# Patient Record
Sex: Female | Born: 1935 | Race: White | Hispanic: Yes | Marital: Married
Health system: Southern US, Community
[De-identification: ages and names within clinical notes are randomized; demographics above are authoritative.]

## PROBLEM LIST (undated history)

## (undated) DIAGNOSIS — E039 Hypothyroidism, unspecified: Secondary | ICD-10-CM

## (undated) DIAGNOSIS — I10 Essential (primary) hypertension: Secondary | ICD-10-CM

## (undated) DIAGNOSIS — F0390 Unspecified dementia without behavioral disturbance: Secondary | ICD-10-CM

## (undated) DIAGNOSIS — I82409 Acute embolism and thrombosis of unspecified deep veins of unspecified lower extremity: Secondary | ICD-10-CM

## (undated) DIAGNOSIS — E079 Disorder of thyroid, unspecified: Secondary | ICD-10-CM

## (undated) HISTORY — PX: JOINT REPLACEMENT: SHX530

## (undated) HISTORY — PX: ABDOMINAL HYSTERECTOMY: SHX81

## (undated) HISTORY — DX: Essential (primary) hypertension: I10

## (undated) HISTORY — DX: Disorder of thyroid, unspecified: E07.9

## (undated) HISTORY — PX: APPENDECTOMY: SHX54

## (undated) HISTORY — PX: CHOLECYSTECTOMY: SHX55

## (undated) HISTORY — PX: NO PAST SURGERIES: SHX2092

## (undated) HISTORY — PX: OTHER SURGICAL HISTORY: SHX169

## (undated) HISTORY — PX: TUBAL LIGATION: SHX77

---

## 1999-05-17 ENCOUNTER — Encounter: Payer: Self-pay | Admitting: Family Medicine

## 1999-05-17 ENCOUNTER — Encounter: Admission: RE | Admit: 1999-05-17 | Discharge: 1999-05-17 | Payer: Self-pay | Admitting: Family Medicine

## 2000-05-20 ENCOUNTER — Encounter: Payer: Self-pay | Admitting: Family Medicine

## 2000-05-20 ENCOUNTER — Encounter: Admission: RE | Admit: 2000-05-20 | Discharge: 2000-05-20 | Payer: Self-pay | Admitting: Family Medicine

## 2000-08-01 ENCOUNTER — Emergency Department (HOSPITAL_COMMUNITY): Admission: EM | Admit: 2000-08-01 | Discharge: 2000-08-01 | Payer: Self-pay | Admitting: Emergency Medicine

## 2000-08-01 ENCOUNTER — Encounter: Payer: Self-pay | Admitting: Emergency Medicine

## 2001-06-02 ENCOUNTER — Encounter: Payer: Self-pay | Admitting: Family Medicine

## 2001-06-02 ENCOUNTER — Encounter: Admission: RE | Admit: 2001-06-02 | Discharge: 2001-06-02 | Payer: Self-pay | Admitting: Family Medicine

## 2001-10-22 ENCOUNTER — Encounter: Admission: RE | Admit: 2001-10-22 | Discharge: 2001-12-23 | Payer: Self-pay | Admitting: Orthopaedic Surgery

## 2002-06-08 ENCOUNTER — Encounter: Admission: RE | Admit: 2002-06-08 | Discharge: 2002-06-08 | Payer: Self-pay | Admitting: Family Medicine

## 2002-06-08 ENCOUNTER — Encounter: Payer: Self-pay | Admitting: Family Medicine

## 2002-07-19 ENCOUNTER — Encounter: Admission: RE | Admit: 2002-07-19 | Discharge: 2002-09-23 | Payer: Self-pay | Admitting: Orthopedic Surgery

## 2003-07-13 ENCOUNTER — Ambulatory Visit (HOSPITAL_COMMUNITY): Admission: RE | Admit: 2003-07-13 | Discharge: 2003-07-13 | Payer: Self-pay | Admitting: Family Medicine

## 2003-11-29 ENCOUNTER — Encounter: Admission: RE | Admit: 2003-11-29 | Discharge: 2004-02-01 | Payer: Self-pay | Admitting: Family Medicine

## 2004-04-10 ENCOUNTER — Encounter: Admission: RE | Admit: 2004-04-10 | Discharge: 2004-05-11 | Payer: Self-pay

## 2004-10-22 ENCOUNTER — Ambulatory Visit (HOSPITAL_COMMUNITY): Admission: RE | Admit: 2004-10-22 | Discharge: 2004-10-22 | Payer: Self-pay | Admitting: Family Medicine

## 2007-03-31 ENCOUNTER — Encounter: Payer: Self-pay | Admitting: Endocrinology

## 2007-04-29 ENCOUNTER — Encounter: Payer: Self-pay | Admitting: Endocrinology

## 2007-05-13 ENCOUNTER — Encounter: Payer: Self-pay | Admitting: Endocrinology

## 2007-05-13 ENCOUNTER — Encounter: Admission: RE | Admit: 2007-05-13 | Discharge: 2007-05-13 | Payer: Self-pay | Admitting: Family Medicine

## 2007-06-29 ENCOUNTER — Encounter: Admission: RE | Admit: 2007-06-29 | Discharge: 2007-06-29 | Payer: Self-pay | Admitting: Family Medicine

## 2007-06-30 ENCOUNTER — Encounter: Payer: Self-pay | Admitting: Endocrinology

## 2007-07-08 ENCOUNTER — Encounter: Payer: Self-pay | Admitting: Endocrinology

## 2007-07-20 DIAGNOSIS — Z8672 Personal history of thrombophlebitis: Secondary | ICD-10-CM | POA: Insufficient documentation

## 2007-07-20 DIAGNOSIS — M1990 Unspecified osteoarthritis, unspecified site: Secondary | ICD-10-CM | POA: Insufficient documentation

## 2007-07-21 ENCOUNTER — Ambulatory Visit: Payer: Self-pay | Admitting: Endocrinology

## 2007-08-11 ENCOUNTER — Encounter: Admission: RE | Admit: 2007-08-11 | Discharge: 2007-08-11 | Payer: Self-pay | Admitting: Endocrinology

## 2007-08-24 ENCOUNTER — Telehealth (INDEPENDENT_AMBULATORY_CARE_PROVIDER_SITE_OTHER): Payer: Self-pay | Admitting: *Deleted

## 2007-09-21 ENCOUNTER — Ambulatory Visit: Payer: Self-pay | Admitting: Endocrinology

## 2007-09-21 LAB — CONVERTED CEMR LAB: TSH: 0.02 microintl units/mL — ABNORMAL LOW (ref 0.35–5.50)

## 2007-10-21 ENCOUNTER — Ambulatory Visit: Payer: Self-pay | Admitting: Endocrinology

## 2007-10-21 DIAGNOSIS — E890 Postprocedural hypothyroidism: Secondary | ICD-10-CM | POA: Insufficient documentation

## 2007-10-21 LAB — CONVERTED CEMR LAB: Free T4: 0.3 ng/dL — ABNORMAL LOW (ref 0.6–1.6)

## 2007-11-03 ENCOUNTER — Telehealth (INDEPENDENT_AMBULATORY_CARE_PROVIDER_SITE_OTHER): Payer: Self-pay | Admitting: *Deleted

## 2007-11-18 ENCOUNTER — Ambulatory Visit: Payer: Self-pay | Admitting: Endocrinology

## 2007-12-31 ENCOUNTER — Ambulatory Visit: Payer: Self-pay | Admitting: Endocrinology

## 2008-01-04 LAB — CONVERTED CEMR LAB: TSH: 3.98 microintl units/mL (ref 0.35–5.50)

## 2008-03-31 ENCOUNTER — Encounter: Admission: RE | Admit: 2008-03-31 | Discharge: 2008-03-31 | Payer: Self-pay | Admitting: Family Medicine

## 2008-04-07 ENCOUNTER — Ambulatory Visit: Payer: Self-pay | Admitting: Endocrinology

## 2008-04-07 LAB — CONVERTED CEMR LAB: TSH: 2.59 u[IU]/mL

## 2008-04-22 IMAGING — US US SOFT TISSUE HEAD/NECK
1 series · 14 of 25 positions shown · non-contrast
Comparison: none

CLINICAL DATA: Low TSH.
 THYROID ULTRASOUND:
TECHNIQUE: Ultrasound examination of the thyroid gland and adjacent soft tissue structures was performed.

[Series 1: us soft tissue head/neck · 0.08mm/px · 14 of 41 slices shown]
[im 1/41]
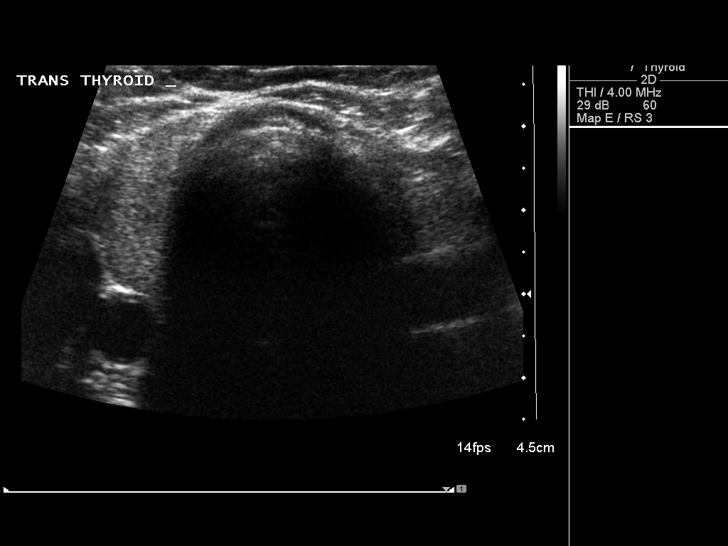
[im 4/41]
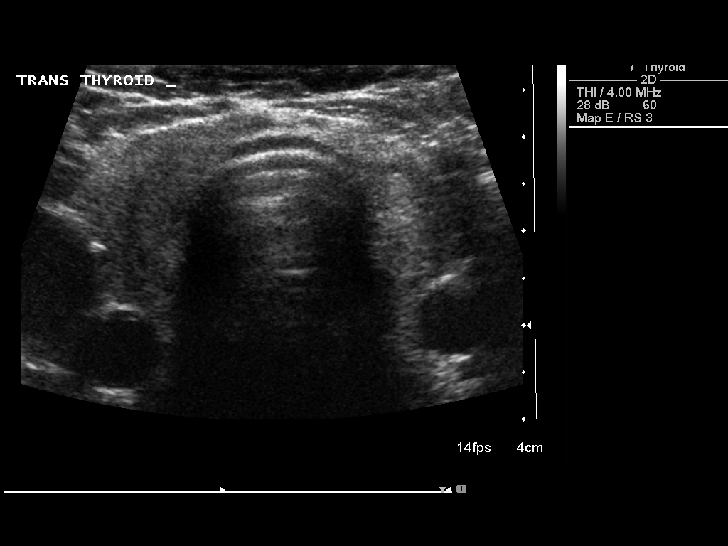
[im 7/41]
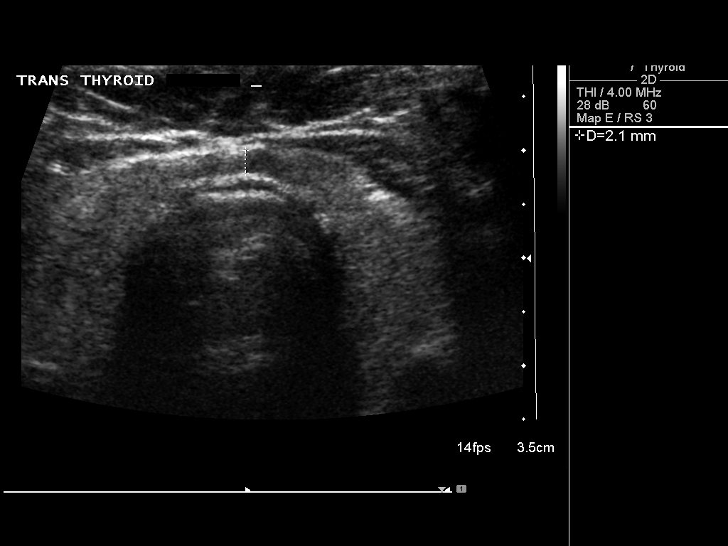
[im 11/41]
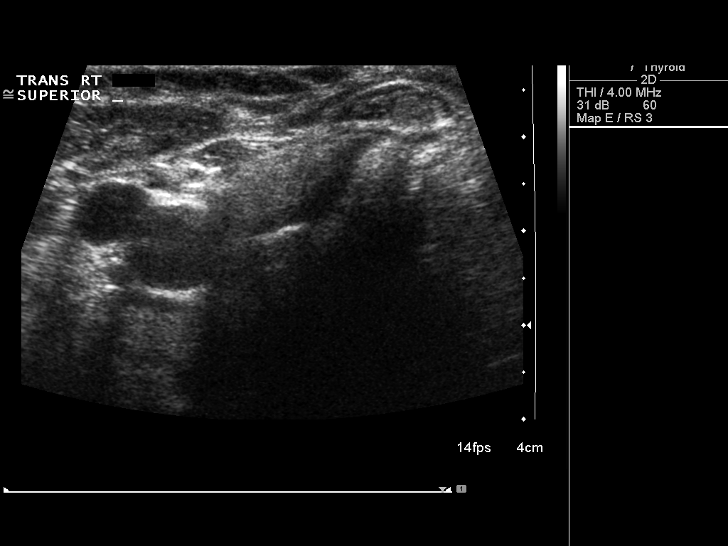
[im 14/41]
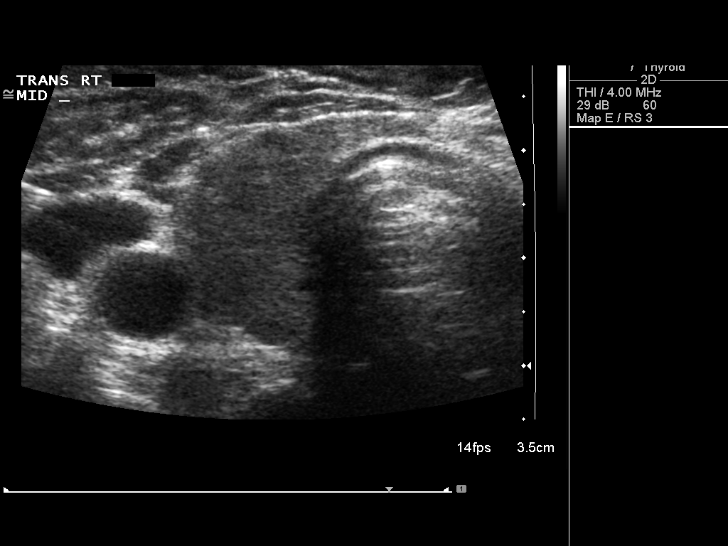
[im 16/41]
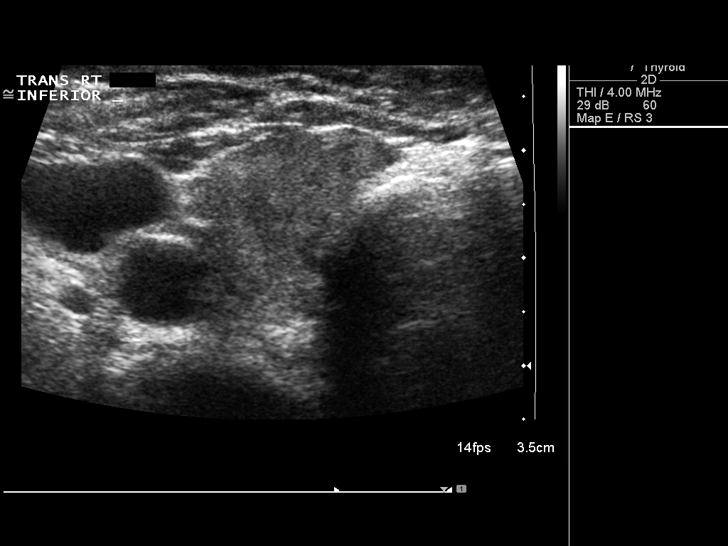
[im 19/41]
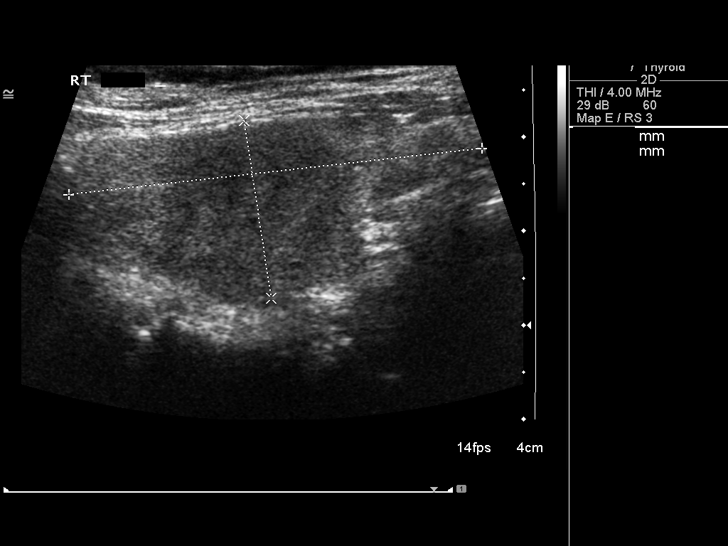
[im 22/41]
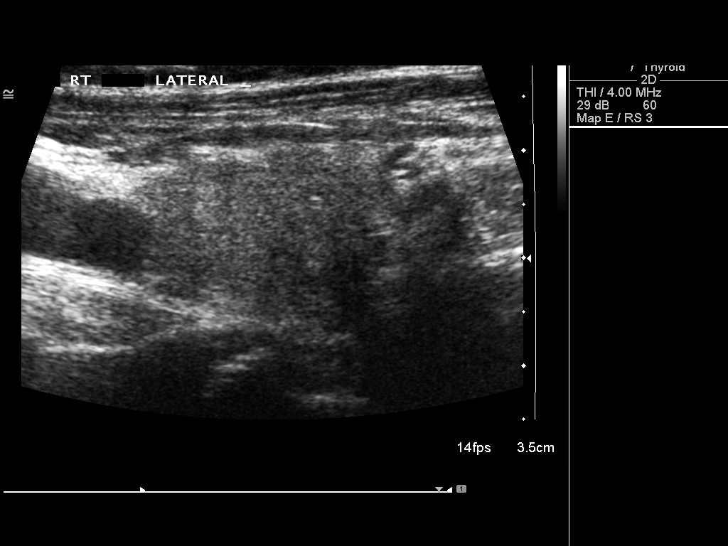
[im 26/41]
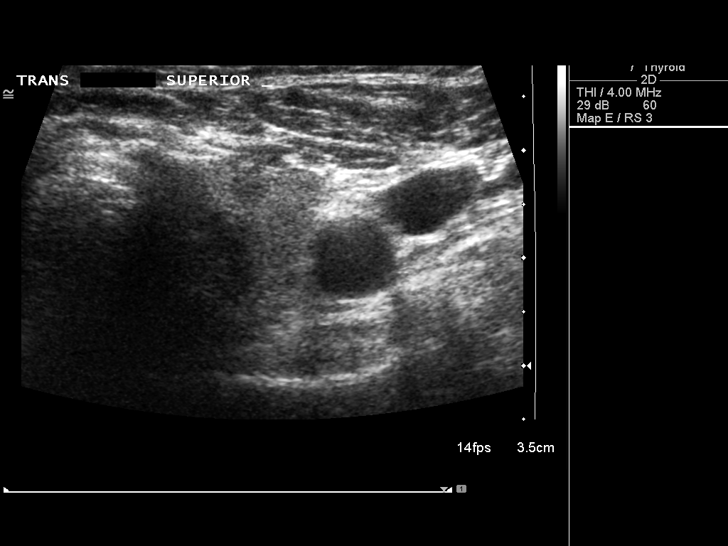
[im 27/41]
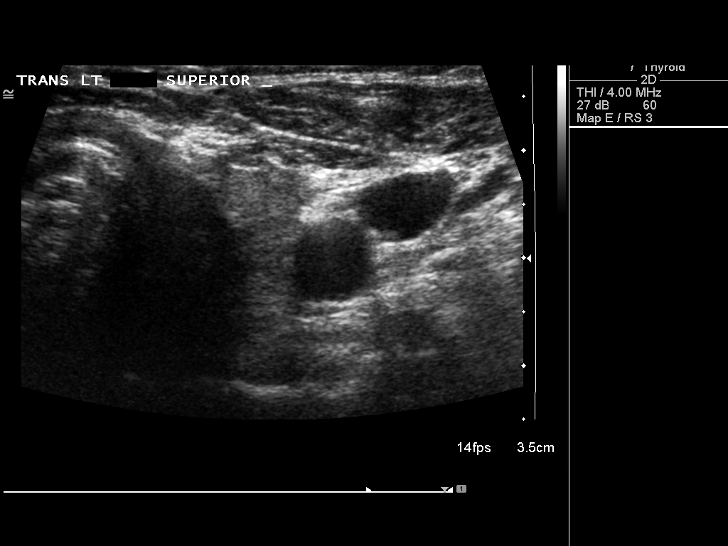
[im 31/41]
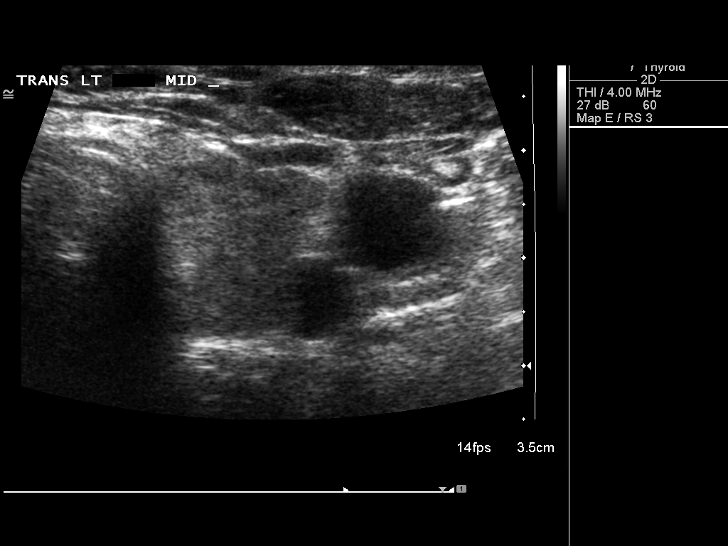
[im 34/41]
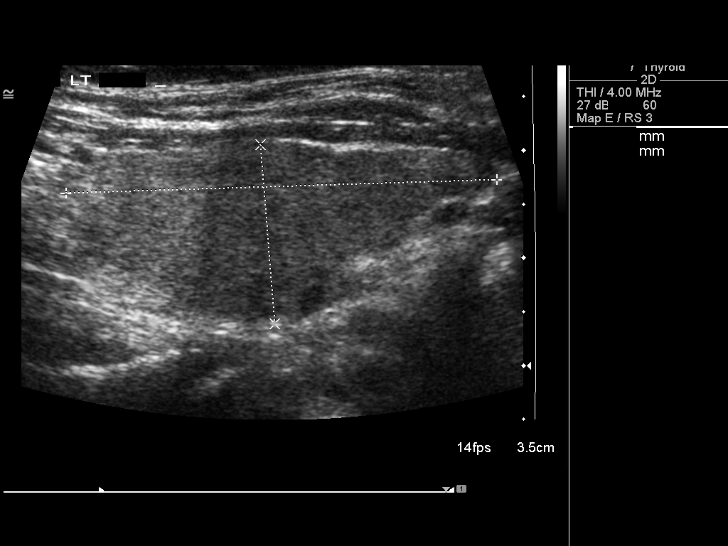
[im 37/41]
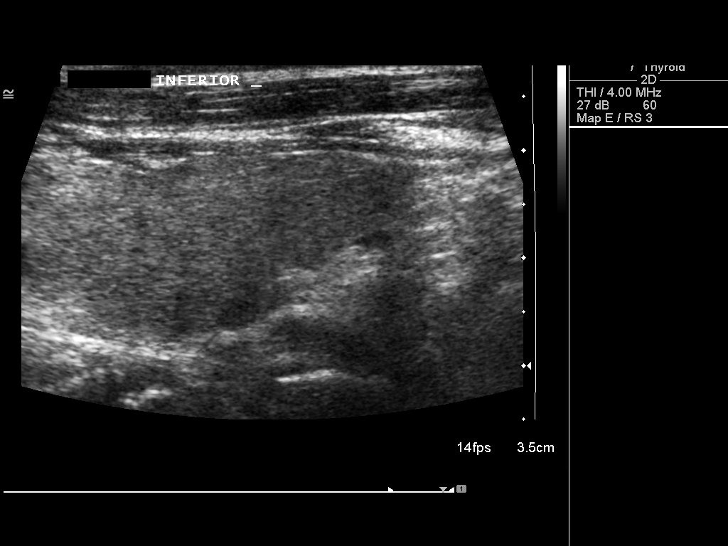
[im 41/41]
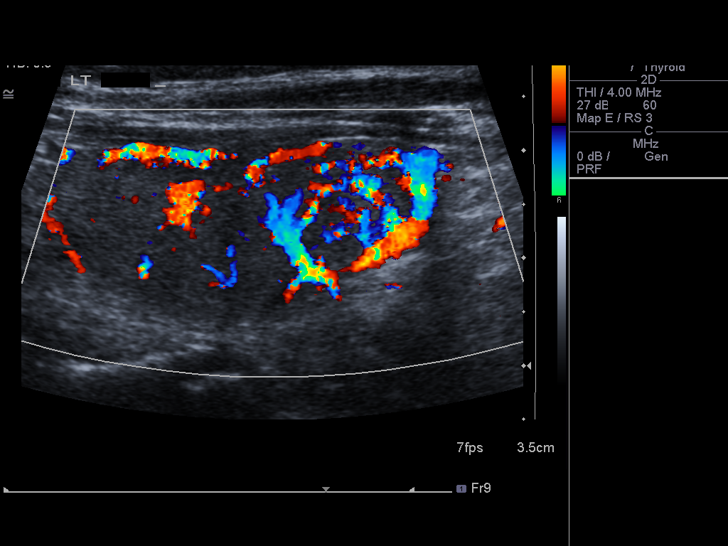

[14 of 25 positions shown; findings below may reference images not displayed]

FINDINGS: The thyroid gland is normal in size.  The right lobe measures 4.4 cm sagittally with a depth of 1.9 cm and width of 1.4 cm.  The left lobe measures 4.0 x 1.7 x 1.4 cm with the isthmus measuring 2.1 mm.  The gland is somewhat inhomogeneous.  No solid or cystic lesion is seen.
IMPRESSION: Thyroid gland normal in size and slightly inhomogeneous.  No solid or cystic nodule.

## 2010-04-08 ENCOUNTER — Encounter: Payer: Self-pay | Admitting: Family Medicine

## 2010-10-31 ENCOUNTER — Ambulatory Visit (INDEPENDENT_AMBULATORY_CARE_PROVIDER_SITE_OTHER): Payer: Medicare Other | Admitting: General Surgery

## 2010-10-31 ENCOUNTER — Encounter (INDEPENDENT_AMBULATORY_CARE_PROVIDER_SITE_OTHER): Payer: Self-pay | Admitting: General Surgery

## 2010-10-31 VITALS — BP 142/90 | HR 68 | Temp 97.2°F | Ht 66.0 in | Wt 231.4 lb

## 2010-10-31 DIAGNOSIS — K648 Other hemorrhoids: Secondary | ICD-10-CM

## 2010-10-31 NOTE — Patient Instructions (Signed)
Try at least a half dose of MiraLax daily I would recommend taking it at night if you have leakage linear active at all the time and hopefully will have a good valve movement each morning and then I would use suppositories rectal 2 or 3 times a day and return to see me in approximately 2-3 weeks. Occlude the bleeding we'll taper off and you can proceed with your knee surgery as scheduled in October. If the bleeding is not tapering off then a PPH could be considered prior to year-old knee surgery

## 2010-10-31 NOTE — Progress Notes (Signed)
Subjective:     Patient ID: [name], female   DOB: Jan 10, 1930, 77 y.o.   MRN: [id number]  HPIPatient is for productive vascular for management of bleeding internal hemorrhoids. The patient states that she's got a common knee replacement for which she'll be on Coumadin for approximately 3 months in October and she is having problems with bleeding usually related to have a bowel movement. She's had a recent colonoscopy with the findings of internal hemorrhoids and question as to whether the subcutaneous be done to control the bleeding.   Review of Systems Past Medical History  Diagnosis Date  . Hypertension   . Thyroid disease   . Hemorrhoids    History   Social History  . Marital Status: Married    Spouse Name: N/A    Number of Children: N/A  . Years of Education: N/A   Social History Main Topics  . Smoking status: Never Smoker   . Smokeless tobacco: None  . Alcohol Use: No  . Drug Use: No  . Sexually Active: None   Other Topics Concern  . None   Social History Narrative  . None   Current Outpatient Prescriptions  Medication Sig Dispense Refill  . amLODipine (NORVASC) 5 MG tablet Take 5 mg by mouth daily.        . Cholecalciferol (VITAMIN D PO) Take 2,000 Units by mouth daily.        Marland Kitchen levothyroxine (SYNTHROID, LEVOTHROID) 125 MCG tablet Take 125 mcg by mouth daily.        Marland Kitchen losartan (COZAAR) 100 MG tablet Take 100 mg by mouth daily.         Past Surgical History  Procedure Date  . Cholecystectomy   . Appendectomy   . Tubal ligation   . Joint replacement     left knee      Objective:   Physical ExamBP 142/90  Pulse 68  Temp(Src) 97.2 F (36.2 C) (Temporal)  Ht 5\' 6"  (1.676 m)  Wt 231 lb 6.4 oz (104.962 kg)  BMI 37.35 kg/m2    Patient's await elderly female quite limited as far as her knees because of arthritis pain in her abdominal exam her exam was but only limited to the anus anoscopic examination she's got basically internal hemorrhoids basically  within the anal canal and a large amount of hemorrhoids high in the anal canal and very little hemorrhoids externally the hemorrhoids present or not really amenable to rubber banding or sclerotherapy and hopefully can be managed with general suppository and management of bowel movement frequency   Assessment:       I discussed with the little booklet with the patient about the options and do not think that sclerotherapy or rubber bands would control her symptoms since placement a small inner anal canal hemorrhoids or bleeding hopefully MiraLax would be helpful and suppositories and she may need a PPH. I've discussed this with Dr. Johna Sheriff and he would be willing to do a PPH if conservative management does not control her symptomsPlan:    Injections oral rubber bands were controlled her symptoms I discussed her case with Dr. Johna Sheriff and he agrees that a PPH would probably controlled her symptoms if conservative management spinal

## 2010-11-23 ENCOUNTER — Ambulatory Visit (INDEPENDENT_AMBULATORY_CARE_PROVIDER_SITE_OTHER): Payer: Medicare Other | Admitting: General Surgery

## 2010-12-12 ENCOUNTER — Other Ambulatory Visit: Payer: Self-pay | Admitting: Orthopedic Surgery

## 2010-12-12 ENCOUNTER — Other Ambulatory Visit (HOSPITAL_COMMUNITY): Payer: Self-pay | Admitting: Orthopedic Surgery

## 2010-12-12 ENCOUNTER — Ambulatory Visit (HOSPITAL_COMMUNITY)
Admission: RE | Admit: 2010-12-12 | Discharge: 2010-12-12 | Disposition: A | Discharge status: Home / Self Care | Payer: Medicare Other | Source: Ambulatory Visit | Attending: Orthopedic Surgery | Admitting: Orthopedic Surgery

## 2010-12-12 ENCOUNTER — Encounter (HOSPITAL_COMMUNITY): Payer: Medicare Other

## 2010-12-12 DIAGNOSIS — Z01818 Encounter for other preprocedural examination: Secondary | ICD-10-CM

## 2010-12-12 DIAGNOSIS — I10 Essential (primary) hypertension: Secondary | ICD-10-CM | POA: Insufficient documentation

## 2010-12-12 DIAGNOSIS — M1710 Unilateral primary osteoarthritis, unspecified knee: Secondary | ICD-10-CM | POA: Insufficient documentation

## 2010-12-12 DIAGNOSIS — Z01812 Encounter for preprocedural laboratory examination: Secondary | ICD-10-CM | POA: Insufficient documentation

## 2010-12-12 LAB — URINE MICROSCOPIC-ADD ON

## 2010-12-12 LAB — SURGICAL PCR SCREEN
MRSA, PCR: NEGATIVE
Staphylococcus aureus: NEGATIVE

## 2010-12-12 LAB — URINALYSIS, ROUTINE W REFLEX MICROSCOPIC
Glucose, UA: NEGATIVE mg/dL
Hgb urine dipstick: NEGATIVE
Ketones, ur: NEGATIVE mg/dL
Protein, ur: NEGATIVE mg/dL
pH: 5.5 (ref 5.0–8.0)

## 2010-12-12 LAB — CBC
HCT: 43.4 % (ref 36.0–46.0)
Hemoglobin: 14 g/dL (ref 12.0–15.0)
MCH: 29.5 pg (ref 26.0–34.0)
MCHC: 32.3 g/dL (ref 30.0–36.0)
RBC: 4.74 MIL/uL (ref 3.87–5.11)

## 2010-12-12 LAB — COMPREHENSIVE METABOLIC PANEL
Albumin: 4 g/dL (ref 3.5–5.2)
BUN: 32 mg/dL — ABNORMAL HIGH (ref 6–23)
Calcium: 10.5 mg/dL (ref 8.4–10.5)
GFR calc Af Amer: >60 mL/min (ref >60)
Glucose, Bld: 102 mg/dL — ABNORMAL HIGH (ref 70–99)
Total Protein: 8.3 g/dL (ref 6.0–8.3)

## 2010-12-12 LAB — ABO/RH: ABO/RH(D): A POS

## 2010-12-12 LAB — PROTIME-INR
INR: 1.05 (ref 0.00–1.49)
Prothrombin Time: 13.9 seconds (ref 11.6–15.2)

## 2010-12-17 ENCOUNTER — Inpatient Hospital Stay (HOSPITAL_COMMUNITY)
Admission: RE | Admit: 2010-12-17 | Discharge: 2010-12-20 | DRG: 470 | Disposition: A | Discharge status: Home Health | Payer: Medicare Other | Source: Ambulatory Visit | Attending: Orthopedic Surgery | Admitting: Orthopedic Surgery

## 2010-12-17 DIAGNOSIS — Z880 Allergy status to penicillin: Secondary | ICD-10-CM

## 2010-12-17 DIAGNOSIS — Z79899 Other long term (current) drug therapy: Secondary | ICD-10-CM

## 2010-12-17 DIAGNOSIS — E039 Hypothyroidism, unspecified: Secondary | ICD-10-CM | POA: Diagnosis present

## 2010-12-17 DIAGNOSIS — F29 Unspecified psychosis not due to a substance or known physiological condition: Secondary | ICD-10-CM | POA: Diagnosis not present

## 2010-12-17 DIAGNOSIS — Z01812 Encounter for preprocedural laboratory examination: Secondary | ICD-10-CM

## 2010-12-17 DIAGNOSIS — I10 Essential (primary) hypertension: Secondary | ICD-10-CM | POA: Diagnosis present

## 2010-12-17 DIAGNOSIS — E8779 Other fluid overload: Secondary | ICD-10-CM | POA: Diagnosis not present

## 2010-12-17 DIAGNOSIS — Z96659 Presence of unspecified artificial knee joint: Secondary | ICD-10-CM

## 2010-12-17 DIAGNOSIS — T4275XA Adverse effect of unspecified antiepileptic and sedative-hypnotic drugs, initial encounter: Secondary | ICD-10-CM | POA: Diagnosis not present

## 2010-12-17 DIAGNOSIS — Z86718 Personal history of other venous thrombosis and embolism: Secondary | ICD-10-CM

## 2010-12-17 DIAGNOSIS — M1710 Unilateral primary osteoarthritis, unspecified knee: Principal | ICD-10-CM | POA: Diagnosis present

## 2010-12-17 DIAGNOSIS — M21069 Valgus deformity, not elsewhere classified, unspecified knee: Secondary | ICD-10-CM | POA: Diagnosis present

## 2010-12-17 LAB — TYPE AND SCREEN
ABO/RH(D): A POS
Antibody Screen: NEGATIVE

## 2010-12-18 LAB — CBC
Hemoglobin: 11.4 g/dL — ABNORMAL LOW (ref 12.0–15.0)
MCH: 29.6 pg (ref 26.0–34.0)
MCHC: 32 g/dL (ref 30.0–36.0)
Platelets: 218 10*3/uL (ref 150–400)
RDW: 13.4 % (ref 11.5–15.5)

## 2010-12-18 LAB — BASIC METABOLIC PANEL
BUN: 29 mg/dL — ABNORMAL HIGH (ref 6–23)
CO2: 26 mEq/L (ref 19–32)
Calcium: 8.7 mg/dL (ref 8.4–10.5)
Calcium: 8.7 mg/dL (ref 8.4–10.5)
Chloride: 102 mEq/L (ref 96–112)
Creatinine, Ser: 1.15 mg/dL — ABNORMAL HIGH (ref 0.50–1.10)
Creatinine, Ser: 1.28 mg/dL — ABNORMAL HIGH (ref 0.50–1.10)
GFR calc Af Amer: 51 mL/min — ABNORMAL LOW (ref >90)
GFR calc non Af Amer: 39 mL/min — ABNORMAL LOW (ref >90)

## 2010-12-18 LAB — PROTIME-INR: Prothrombin Time: 14.4 seconds (ref 11.6–15.2)

## 2010-12-19 LAB — CBC
HCT: 27.6 % — ABNORMAL LOW (ref 36.0–46.0)
MCHC: 33 g/dL (ref 30.0–36.0)
MCV: 90.2 fL (ref 78.0–100.0)
RDW: 13.6 % (ref 11.5–15.5)

## 2010-12-19 LAB — BASIC METABOLIC PANEL
BUN: 34 mg/dL — ABNORMAL HIGH (ref 6–23)
Calcium: 8.6 mg/dL (ref 8.4–10.5)
GFR calc non Af Amer: 44 mL/min — ABNORMAL LOW (ref >90)
Glucose, Bld: 98 mg/dL (ref 70–99)
Sodium: 136 mEq/L (ref 135–145)

## 2010-12-19 LAB — PROTIME-INR: INR: 1.21 (ref 0.00–1.49)

## 2010-12-20 LAB — PROTIME-INR: INR: 1.37 (ref 0.00–1.49)

## 2010-12-20 LAB — CBC
MCH: 29.4 pg (ref 26.0–34.0)
MCHC: 32.2 g/dL (ref 30.0–36.0)
Platelets: 176 10*3/uL (ref 150–400)

## 2010-12-26 NOTE — H&P (Signed)
NAMEJOHNNI, WUNSCHEL            ACCOUNT NO.:  [id number]  MEDICAL RECORD NO.:  [id number]  LOCATION:  1615                         FACILITY:  [facility]  PHYSICIAN:  Ollen Gross, M.D.    DATE OF BIRTH:  1929-10-27  DATE OF ADMISSION:  12/17/2010 DATE OF DISCHARGE:                             HISTORY & PHYSICAL   PRIMARY CARE PHYSICIAN:  Caryn Bee L. Little, M.D.  CARDIOLOGIST:  Corky Crafts, MD  CHIEF COMPLAINT:  Right knee pain.  HISTORY OF PRESENT ILLNESS:  The patient is a 77 year old female, who has been seen by Dr. Lequita Halt for ongoing right knee pain.  She has problems for many years now.  She has progressively gotten worse with time.  She was seen in the office where x-rays have already shown where she has advanced end-stage with bone-on-bone.  She has been treated conservatively in the past.  It is felt she would benefit from undergoing surgical intervention.  Risks and benefits have beendiscussed.  She elects to proceed with surgery.  ALLERGIES: 1. CODEINE causes nausea and vomiting. 2. PENICILLIN causes swelling. 3. CONTRAST DYE causes her to "pass out."  Please note she is able to take hydrocodone and oxycodone.  CURRENT MEDICATIONS: 1. Levothyroxine 125 mcg daily. 2. Amlodipine 5 mg daily. 3. Losartan 100 mg daily. 4. Vitamin D 2000 international units daily.  PAST MEDICAL HISTORY: 1. Hypertension. 2. Hemorrhoids. 3. History of left lower extremity DVT following arthroscopy and also     a second time she had multiple blood clots in her leg. 4. Urinary incontinence.  PAST SURGICAL HISTORY: 1. Hysterectomy in 1976. 2. Left knee replaced in 2003. 3. Tubal ligation. 4. Gallbladder surgery in 1999. 5. Cataract surgery in 2008, and again in 2010. 6. Colonoscopy procedure.  FAMILY HISTORY:  Father deceased in his 55s with kidney issues, he had an urostomy.  Mother deceased in her 36s.  SOCIAL HISTORY:  Married, retired Engineer, civil (consulting), nonsmoker.  No alcohol.   She has 2 steps to entering her home.  She does have a living will.  REVIEW OF SYSTEMS:  GENERAL:  No fevers, chills, or night sweats. NEURO:  No seizures, syncope, or paralysis.  RESPIRATORY:  No shortness breath, productive cough, or hemoptysis.  CARDIOVASCULAR:  No chest pain or orthopnea.  GI:  No nausea, vomiting, diarrhea, or constipation.  GU: Little bit of urinary continence.  No dysuria or hematuria. MUSCULOSKELETAL:  Knee pain.  PHYSICAL EXAMINATION:  VITAL SIGNS:  Pulse 68, respirations 14, blood pressure 130/88. GENERAL:  A 77 year old white female, well-nourished, well-developed, no acute stress.  She is alert, oriented, cooperative, pleasant, and overweight.  She is accompanied by her husband. HEENT:  Normocephalic, atraumatic.  Pupils round and reactive.  EOMs intact.  Not wearing glasses. NECK:  Supple.  No carotid bruits. CHEST:  Clear. HEART:  Regular rate and rhythm without murmur.  S1, S2 noted. ABDOMEN:  Soft, round, slight protuberant abdomen.  Bowel sounds present. RECTAL, BREASTS, GENITALIA:  Not done.  Not pertinent to present illness. EXTREMITIES:  Right knee, no effusion.  Range of motion 10 to 115, marked crepitus, and moderate valgus.  Left knee, no effusion.  Range of motion 0 to 115.  No  instability.  IMPRESSION:  Osteoarthritis, right knee.  PLAN:  The patient will be admitted to [facility] to undergo right total knee replacement and arthroplasty.  Surgery will be performed by Dr. Ollen Gross.     Alexzandrew L. Julien Girt, P.A.C.   ______________________________ Ollen Gross, M.D.    ALP/MEDQ  D:  12/19/2010  T:  12/20/2010  Job:  [id number]  cc:   Corky Crafts, MD Fax: [phone]  Anna Genre. Little, M.D. Fax: [phone]  Electronically Signed by Patrica Duel P.A.C. on 12/20/2010 10:59:56 AM Electronically Signed by Ollen Gross M.D. on 12/26/2010 11:22:01 AM

## 2010-12-26 NOTE — Op Note (Signed)
[name], [name]            ACCOUNT NO.:  [id number]  MEDICAL RECORD NO.:  [id number]  LOCATION:  1615                         FACILITY:  [facility]  PHYSICIAN:  Ollen Gross, M.D.    DATE OF BIRTH:  Sep 30, 1929  DATE OF PROCEDURE:  12/17/2010 DATE OF DISCHARGE:                              OPERATIVE REPORT   PREOPERATIVE DIAGNOSIS:  Osteoarthritis, right knee.  POSTOPERATIVE DIAGNOSIS:  Osteoarthritis, right knee.  PROCEDURE:  Right total knee arthroplasty.  SURGEON:  Ollen Gross, MD  ASSISTANT:  Alexzandrew L. Julien Girt, PA-C  ANESTHESIA:  General.  ESTIMATED BLOOD LOSS:  200.  DRAIN:  Hemovac x1.  TOURNIQUET TIME:  12 minutes at 300 mmHg.  COMPLICATIONS:  None.  CONDITION:  Stable to Recovery.  BRIEF CLINICAL NOTE:  [name] is a 77 year old female with advanced end-stage arthritis of the right knee with progressively worsening pain and dysfunction.  She has failed nonoperative management including injections.  She has had a previous successful left total knee arthroplasty.  She presents now for right total knee arthroplasty.  She has bone on bone changes in the medial, lateral, and patellofemoral compartments.  PROCEDURE IN DETAIL:  After successful administration of general anesthetic, a tourniquet was placed high on the right thigh, and right lower extremity prepped and draped in usual sterile fashion.  Extremity was wrapped in an Esmarch, knee flexed, tourniquet inflated to 300 mmHg. Midline incision made with a 10 blade through subcutaneous tissue to the level of the extensor mechanism.  A fresh blade was used to make a medial parapatellar arthrotomy.  Soft tissue on the proximal medial tibia subperiosteally elevated to the joint line with the knife and into the semimembranosus bursa with a Cobb elevator.  Soft tissue laterally was elevated with attention being paid to avoid any patellar tendon on tibial tubercle.  Patella was everted, knee flexed 90  degrees, and ACL and PCL removed.  She had bone on bone change to lateral compartment with some areas of exposed bone, medial femur, medial tibia, large osteophytes, and exposed bone of the patellofemoral compartment.  A drill was used to create a starting hole in the distal femur and canal was thoroughly irrigated.  A 5- degree right valgus alignment guide was placed and the distal femoral cutting block pinned to remove 11 mm off the distal femur.  Resection was made with an oscillating saw.  The tibia subluxed forward and menisci removed.  Extramedullary tibial alignment guide was placed, referencing proximally to medial aspect of tibial tubercle distally along the 2nd metatarsal axis and tibial crest. Block was pinned to remove 2 mm off the more deficient lateral side. Tibial resection was made with an oscillating saw.  Size 3 was the most appropriate tibial component and proximal tibia was then prepared with a modular drill and keel punched for the size 3.  At this point, it was noted that the tourniquet is longer functional. She was having lot of venous back bleeding.  I let the tourniquet down for a total time of 12 minutes.  The bleeding subsided significantly after that.  The femoral sizing guide was then placed, size 3 was most appropriate on the femur.  Rotation was marked off  the epicondylar axis and confirmed by creating rectangular flexion gap at 90 degrees.  Block was pinned in this rotation.  The anterior and posterior chamfer cuts were made.  Intercondylar block was placed and that cut was made.  Trial size 3 posterior stabilized femur was placed.  A 10 mm posterior stabilized rotating platform insert trial was placed.  With the 10, full extension was achieved with excellent varus-valgus, anterior-posterior balance, throughout full range of motion.  Patella was everted and thickness measured to be 24 mm.  Freehand resection was taken down to 14 mm, 35 template was  placed, lug holes were drilled, trial patella was placed, and it tracked normally.  Osteophytes removed off the posterior femur with the trial in place.  All trials were removed and the cut bone surfaces were prepared with pulsatile lavage.  Cement was mixed and once ready for implantation, the size 3 mobile-bearing tibial tray, size 3 posterior stabilized femur, and 35 patella were cemented into place. Patella was held with a clamp.  Trial 10 mm insert was placed, knee held in full extension, and all extruded cement removed.  When the cement was fully hardened, then the permanent 10 mm posterior stabilized rotating platform insert was placed into the tibial tray.  The wound was copiously irrigated with saline solution and the arthrotomy closed over Hemovac drain with interrupted #1 PDS.  Flexion against gravity was 135 degrees.  Patella tracked normally.  Subcutaneous was then closed with interrupted 2-0 Vicryl and subcuticular with running 4-0 Monocryl.  The catheter for the Marcaine pain pump was placed and pump was initiated. Incision was cleaned and dried and Steri-Strips and a bulky sterile dressing were applied.  She was then placed into a knee immobilizer, awakened, and transported to Recovery in stable condition.  Please note that a surgical assistant was a medical necessity in this case in order to perform it in a safe and expeditious manner.  Surgical assistant was necessary for retraction of ligaments and neurovascular structures to prevent injury to these structures.  Also a surgical assistant was necessary for proper positioning of limb to allow for anatomic placement of the prosthetic components.     Ollen Gross, M.D.     FA/MEDQ  D:  12/17/2010  T:  12/18/2010  Job:  [id number]  Electronically Signed by Ollen Gross M.D. on 12/26/2010 11:21:57 AM

## 2011-01-09 NOTE — Discharge Summary (Signed)
[name], [name]            ACCOUNT NO.:  [id number]  MEDICAL RECORD NO.:  [id number]  LOCATION:  1615                         FACILITY:  [facility]  PHYSICIAN:  Ollen Gross, M.D.    DATE OF BIRTH:  [birth year]  DATE OF ADMISSION:  12/17/2010 DATE OF DISCHARGE:  12/20/2010                              DISCHARGE SUMMARY   ADMITTING DIAGNOSES: 1. Osteoarthritis, right knee. 2. Hypertension. 3. Hemorrhoids. 4. Past history of left lower extremity deep vein thrombosis following     arthroscopy with also a second history of blood clots in her legs. 5. Urinary incontinence.  DISCHARGE DIAGNOSES: 1. Osteoarthritis, right knee, status post right total knee     replacement arthroplasty. 2. Postoperative acute blood loss anemia. 3. Osteoarthritis, right knee. 4. Hypertension. 5. Hemorrhoids. 6. Past history of left lower extremity deep vein thrombosis following     arthroscopy with also a second history of blood clots in her legs. 7. Urinary incontinence.  PROCEDURE:  December 17, 2010, right total knee.  SURGEON:  Ollen Gross, M.D.  ASSISTANT:  Alexzandrew L. Perkins, P.A.C.  ANESTHESIA:  General.  CONSULTATIONS:  None.  BRIEF HISTORY:  The patient is a 77 year old female with advanced arthritis of the right knee with progressive worsening pain and dysfunction, failed nonoperative management including injections.  She has had a successful left total knee and now presents for right total knee.  LABORATORY DATA:  Laboratory data at admission CBC not scanned in the chart.  Postoperative hemoglobin was down to 11.4 then drifted down to 9.1 and got as low as 8.6.  Serial pro times followed per Coumadin protocol last done PT and INR 17.1 and 1.37.  Chem panel on admission not scanned in the chart, but serial BMETs followed for 3 days.  She had an elevated potassium day #1 after surgery and did not know if this was with the retention or if this was hemolysis, we followed it  and it dropped to 4.8 and it was last noted at 4.  She did have a little bit of elevated BUN and creatinine postoperative with 29 and 1.15 on day one. BUN did go up a little bit to 34.  Creatinine in the range stable to 1.16.  HOSPITAL COURSE:  The patient admitted to [facility], taken to OR, underwent above-stated procedure without complication.  The patient tolerated the procedure well, later transferred to recovery room in orthopedic floor, started on p.o. and IV analgesic, pain control following surgery.  She was placed on Coumadin postoperatively due to her history of DVT.  She also was placed on her Lovenox bridge.  She had the elevated potassium which was noted on day 1.  She was on ARB and blood pressure medications.  We held that temporarily until the potassium improved.  She also noted to be a little overloaded from her operative fluids so we gave a little Lasix for diuresis which helped. She started to get up on bed on day #1 and by day #2, she is doing a little bit better and pain was under better control.  Her potassium come down to a normal level.  We changed her dressing and incision looked good.  Hemoglobin was  down to 9.1, so we gave her a little iron supplement.  She started doing a little bit better.  She was having more pain though with the CPM so we discontinued the CPM due to increasing in pain.  By the next day, she was doing well.  She has had a little bit of intermittent confusion on the evening of day #2 which was felt to be due to narcotics so what we did was discontinued the narcotics and put her on Ultram.  She was slowly progressing and not quite ready for discharge on the morning of day #3 so what we did was we had 2 sessions of physical therapy.  By the afternoon, she had been taken the Ultram which did help with her pain.  She improved with her physical therapy and met her goals she was discharged home later that afternoon.  DISCHARGE PLAN:   The patient discharged home on December 20, 2010.  DISCHARGE DIAGNOSES:  Please see above.  DISCHARGE MEDICATIONS:  Ultram, Robaxin, Nu-Iron, Coumadin, and Lovenox.  Continue home medications of amlodipine, levothyroxine, and losartan.  DIET:  Heart healthy diet.  ACTIVITY:  She is weightbearing as tolerated.  Total knee protocol.  FOLLOWUP:  In 2 weeks.  DISPOSITION:  Condition upon discharge improved.     Alexzandrew L. Julien Girt, P.A.C.   ______________________________ Ollen Gross, M.D.    ALP/MEDQ  D:  12/28/2010  T:  12/28/2010  Job:  [id number]  cc:   Caryn Bee L. Little, M.D. Fax: [phone]  Corky Crafts, MD Fax: [phone]  Electronically Signed by Patrica Duel P.A.C. on 01/05/2011 09:14:53 AM Electronically Signed by Ollen Gross M.D. on 01/09/2011 11:23:57 AM

## 2011-01-17 ENCOUNTER — Ambulatory Visit: Payer: Medicare Other | Attending: Orthopedic Surgery | Admitting: Rehabilitative and Restorative Service Providers"

## 2011-01-17 DIAGNOSIS — M25559 Pain in unspecified hip: Secondary | ICD-10-CM | POA: Insufficient documentation

## 2011-01-17 DIAGNOSIS — IMO0001 Reserved for inherently not codable concepts without codable children: Secondary | ICD-10-CM | POA: Insufficient documentation

## 2011-01-17 DIAGNOSIS — M25569 Pain in unspecified knee: Secondary | ICD-10-CM | POA: Insufficient documentation

## 2011-01-17 DIAGNOSIS — M25669 Stiffness of unspecified knee, not elsewhere classified: Secondary | ICD-10-CM | POA: Insufficient documentation

## 2011-01-17 DIAGNOSIS — R262 Difficulty in walking, not elsewhere classified: Secondary | ICD-10-CM | POA: Insufficient documentation

## 2011-01-21 ENCOUNTER — Ambulatory Visit: Payer: Medicare Other | Admitting: Rehabilitative and Restorative Service Providers"

## 2011-01-23 ENCOUNTER — Ambulatory Visit: Payer: Medicare Other | Admitting: Rehabilitative and Restorative Service Providers"

## 2011-01-25 ENCOUNTER — Ambulatory Visit: Payer: Medicare Other | Admitting: Physical Therapy

## 2011-01-29 ENCOUNTER — Ambulatory Visit: Payer: Medicare Other | Admitting: Rehabilitative and Restorative Service Providers"

## 2011-01-30 ENCOUNTER — Ambulatory Visit: Payer: Medicare Other | Admitting: Rehabilitative and Restorative Service Providers"

## 2011-02-01 ENCOUNTER — Ambulatory Visit: Payer: Medicare Other | Admitting: Physical Therapy

## 2011-02-04 ENCOUNTER — Ambulatory Visit: Payer: Medicare Other | Admitting: Rehabilitative and Restorative Service Providers"

## 2011-02-05 ENCOUNTER — Ambulatory Visit: Payer: Medicare Other | Admitting: Rehabilitative and Restorative Service Providers"

## 2011-02-06 ENCOUNTER — Ambulatory Visit: Payer: Medicare Other | Admitting: Physical Therapy

## 2011-02-12 ENCOUNTER — Ambulatory Visit: Payer: Medicare Other | Admitting: Rehabilitative and Restorative Service Providers"

## 2011-02-13 ENCOUNTER — Ambulatory Visit: Payer: Medicare Other | Admitting: Rehabilitative and Restorative Service Providers"

## 2011-02-14 ENCOUNTER — Ambulatory Visit: Payer: Medicare Other | Admitting: Rehabilitative and Restorative Service Providers"

## 2011-02-18 ENCOUNTER — Ambulatory Visit: Payer: Medicare Other | Attending: Orthopedic Surgery | Admitting: Rehabilitative and Restorative Service Providers"

## 2011-02-18 DIAGNOSIS — M25559 Pain in unspecified hip: Secondary | ICD-10-CM | POA: Insufficient documentation

## 2011-02-18 DIAGNOSIS — M25669 Stiffness of unspecified knee, not elsewhere classified: Secondary | ICD-10-CM | POA: Insufficient documentation

## 2011-02-18 DIAGNOSIS — R262 Difficulty in walking, not elsewhere classified: Secondary | ICD-10-CM | POA: Insufficient documentation

## 2011-02-18 DIAGNOSIS — M25569 Pain in unspecified knee: Secondary | ICD-10-CM | POA: Insufficient documentation

## 2011-02-18 DIAGNOSIS — IMO0001 Reserved for inherently not codable concepts without codable children: Secondary | ICD-10-CM | POA: Insufficient documentation

## 2011-02-20 ENCOUNTER — Ambulatory Visit: Payer: Medicare Other | Admitting: Rehabilitative and Restorative Service Providers"

## 2011-02-21 ENCOUNTER — Ambulatory Visit: Payer: Medicare Other | Admitting: Rehabilitative and Restorative Service Providers"

## 2011-02-25 ENCOUNTER — Ambulatory Visit: Payer: Medicare Other | Admitting: Rehabilitative and Restorative Service Providers"

## 2011-02-27 ENCOUNTER — Ambulatory Visit: Payer: Medicare Other | Admitting: Rehabilitative and Restorative Service Providers"

## 2011-02-28 ENCOUNTER — Ambulatory Visit: Payer: Medicare Other | Admitting: Rehabilitative and Restorative Service Providers"

## 2011-03-04 ENCOUNTER — Encounter: Payer: Medicare Other | Admitting: Rehabilitative and Restorative Service Providers"

## 2011-03-05 ENCOUNTER — Encounter: Payer: Medicare Other | Admitting: Rehabilitative and Restorative Service Providers"

## 2011-03-06 ENCOUNTER — Encounter: Payer: Medicare Other | Admitting: Rehabilitative and Restorative Service Providers"

## 2013-01-04 ENCOUNTER — Other Ambulatory Visit: Payer: Self-pay | Admitting: *Deleted

## 2013-01-04 DIAGNOSIS — E785 Hyperlipidemia, unspecified: Secondary | ICD-10-CM

## 2013-01-04 DIAGNOSIS — Z79899 Other long term (current) drug therapy: Secondary | ICD-10-CM

## 2013-01-13 ENCOUNTER — Ambulatory Visit (INDEPENDENT_AMBULATORY_CARE_PROVIDER_SITE_OTHER): Payer: Medicare Other | Admitting: Podiatrist

## 2013-01-13 ENCOUNTER — Encounter: Payer: Self-pay | Admitting: Podiatrist

## 2013-01-13 VITALS — BP 145/80 | HR 70 | Resp 12 | Ht 60.0 in | Wt 210.0 lb

## 2013-01-13 DIAGNOSIS — B351 Tinea unguium: Secondary | ICD-10-CM

## 2013-01-13 DIAGNOSIS — M79609 Pain in unspecified limb: Secondary | ICD-10-CM

## 2013-01-13 NOTE — Patient Instructions (Signed)
GENERAL FOOT HEALTH INFORMATION:  Moisturize your feet regularly with a cream based lotion such as Cetaphil (Cream) or Eucerin (Cream)- usually available in a tub or crock type of container.  Avoid applying the cream to the toe interspaces themselves to reduce the risk of a fungal infection between the toes.  After showering or bathing be sure to dry well between your toes.    Wear a good fitting shoe-  Running shoe brands I recommend are Brooks, New Balance and [employer]iics.  Always have a shoe fit specialist help you choose your shoes as there are many "varieties" of shoes and they can find you the best fit.  [employer] sports/ [employer] ([place], [place], [place]), [employer], [employer] ([place]) have trained staff to help you in this process.  The [employer] in [place] has a great selection of euro-comfort casual shoes with good comfort and support as well.  Avoid prolonged use of thin ballet type flats, or flip flops.  Everyday use of these shoes can actually cause foot problems or injuries.  Watch your toenails for any signs of infection including drainage, pus redness or swelling along the sides of the toenails.  Soak in epsom salt water and use antibiotic ointment (OTC) if you notice this start to occur.  If the redness does not resolve within 2-3 days, call for an appointment to be seen.  

## 2013-01-13 NOTE — Progress Notes (Signed)
  Subjective:    Patient ID: [name], female    DOB: 04/10/1929, 79 y.o.   MRN: [id number]  HPI Comments: '' TOENAIL TRIM'' patient patient complains of irritation of the digits 3, 4 bilateral. Patient had a previous amputation of the left second toe. There is no toenail present on the right first and second digit she relates pain with ambulation and shoe gear .distal    Review of Systems     Objective:   Physical Exam  neurovascular status is unchanged from the previous visit with palpable symmetric pedal pulses and neurological status intact. Patient's toenails 34 and 5 bilateral are elongated thick and discolored dystrophic and clinically mycotic and painful. She also has hyperkeratotic lesions distal tips of digits is well.    Assessment & Plan:  Assessment:  Painful mycotic nails times 6 Plan: Nails debrided without complication.   debrided skin and tips of toes as well without complication she'll be seen back in 10-12 weeks

## 2013-02-01 ENCOUNTER — Other Ambulatory Visit: Payer: Medicare Other

## 2013-03-24 ENCOUNTER — Encounter: Payer: Self-pay | Admitting: Podiatrist

## 2013-03-24 ENCOUNTER — Ambulatory Visit (INDEPENDENT_AMBULATORY_CARE_PROVIDER_SITE_OTHER): Payer: Medicare Other | Admitting: Podiatrist

## 2013-03-24 VITALS — BP 145/79 | HR 70 | Resp 12

## 2013-03-24 DIAGNOSIS — B351 Tinea unguium: Secondary | ICD-10-CM

## 2013-03-24 DIAGNOSIS — M79609 Pain in unspecified limb: Secondary | ICD-10-CM

## 2013-03-24 NOTE — Progress Notes (Deleted)
   Subjective:    Patient ID: [name], female    DOB: [birth year], 80 y.o.   MRN: [id number]  HPI Comments: '' TOENAILS AND CORN TRIM.''     Review of Systems     Objective:   Physical Exam        Assessment & Plan:

## 2013-03-24 NOTE — Progress Notes (Signed)
HPI Comments: '' TOENAIL TRIM''  [name] presents for follow up on foot and nail care.  She also complains of irritation of the digits 3, 4 bilateral. Patient had a previous amputation of the left second toe. she relates pain with ambulation and shoe gear.    Objective:   Physical Exam  neurovascular status is unchanged from the previous visit with palpable symmetric pedal pulses and neurological status intact. Patient's toenails 3,4 and 5 bilateral are elongated thick and discolored dystrophic and clinically mycotic and painful. She also has hyperkeratotic lesions distal tips of digits is well.  Assessment & Plan:   Assessment: Painful mycotic nails times 6  Plan: Nails debrided without complication. debrided skin and tips of toes as well without complication she'll be seen back in 10-12 weeks

## 2013-04-07 ENCOUNTER — Ambulatory Visit: Payer: PRIVATE HEALTH INSURANCE | Admitting: Podiatrist

## 2013-05-27 ENCOUNTER — Ambulatory Visit (INDEPENDENT_AMBULATORY_CARE_PROVIDER_SITE_OTHER): Payer: Medicare Other | Admitting: Podiatrist

## 2013-05-27 ENCOUNTER — Encounter: Payer: Self-pay | Admitting: Podiatrist

## 2013-05-27 VITALS — BP 160/86 | HR 70 | Resp 12

## 2013-05-27 DIAGNOSIS — M79609 Pain in unspecified limb: Secondary | ICD-10-CM

## 2013-05-27 DIAGNOSIS — B351 Tinea unguium: Secondary | ICD-10-CM

## 2013-05-27 NOTE — Progress Notes (Signed)
   HPI:  Patient presents today for follow up of foot and nail care. Denies any new complaints today.  Objective:  Patients chart is reviewed.  Vascular status reveals pedal pulses noted at 2 out of 4 dp and pt bilateral .  Neurological sensation is Normal to [facility] monofil[facility] bilateral.  Patients nails are thickened, discolored, distrophic, friable and brittle with yellow-brown discoloration digite 3,4,5 left and 1-5 right.  A previous amputation of the left 2nd digit due to a hammertoe is noted.  She has calluses at the tips of digits 3,4 left.  A bruise is growing out on the right hallux.   Patient subjectively relates they are painful with shoes and with ambulation of bilateral feet.  Assessment:  Symptomatic onychomycosis  Plan:  Discussed treatment options and alternatives.  The symptomatic toenails were debrided through manual an mechanical means without complication.  Return appointment recommended at routine intervals of 3 months    Marlowe AschoffKathryn Egerton, DPM

## 2013-06-09 ENCOUNTER — Ambulatory Visit: Payer: Medicare Other | Admitting: Interventional Cardiology

## 2013-07-29 ENCOUNTER — Ambulatory Visit (INDEPENDENT_AMBULATORY_CARE_PROVIDER_SITE_OTHER): Payer: Medicare Other | Admitting: Podiatrist

## 2013-07-29 ENCOUNTER — Encounter: Payer: Self-pay | Admitting: Podiatrist

## 2013-07-29 VITALS — BP 162/87 | HR 60 | Resp 12

## 2013-07-29 DIAGNOSIS — B351 Tinea unguium: Secondary | ICD-10-CM

## 2013-07-29 DIAGNOSIS — M79609 Pain in unspecified limb: Secondary | ICD-10-CM

## 2013-07-30 NOTE — Progress Notes (Signed)
HPI: Patient presents today for follow up of foot and nail care. Denies any new complaints today.  Objective: Patients chart is reviewed. Vascular status reveals pedal pulses noted at 2 out of 4 dp and pt bilateral . Neurological sensation is Normal to [facility]. Patients nails are thickened, discolored, distrophic, friable and brittle with yellow-brown discoloration digite 3,4,5 left and 1-5 right. A previous amputation of the left 2nd digit due to a hammertoe is noted. She has calluses at the tips of digits 3,4 left. A bruise is growing out on the right hallux. Patient subjectively relates they are painful with shoes and with ambulation of bilateral feet.  Assessment: Symptomatic onychomycosis  Plan: Discussed treatment options and alternatives. The symptomatic toenails were debrided through manual an mechanical means without complication. Return appointment recommended at routine intervals of 3 months  Marlowe AschoffKathryn Nayla Dias, DPM

## 2013-09-02 ENCOUNTER — Ambulatory Visit: Payer: Medicare Other | Admitting: Podiatrist

## 2013-10-07 ENCOUNTER — Ambulatory Visit (INDEPENDENT_AMBULATORY_CARE_PROVIDER_SITE_OTHER): Payer: Medicare Other | Admitting: Podiatrist

## 2013-10-07 VITALS — Resp 16

## 2013-10-07 DIAGNOSIS — M79673 Pain in unspecified foot: Secondary | ICD-10-CM

## 2013-10-07 DIAGNOSIS — M79609 Pain in unspecified limb: Secondary | ICD-10-CM

## 2013-10-07 DIAGNOSIS — B351 Tinea unguium: Secondary | ICD-10-CM

## 2013-10-07 NOTE — Progress Notes (Signed)
HPI: Patient presents today for follow up of foot and nail care. Denies any new complaints today.  Objective: Patients chart is reviewed. Vascular status reveals pedal pulses noted at 2 out of 4 dp and pt bilateral . Neurological sensation is Normal to Semmes Weinstein monofilament bilateral. Patients nails are thickened, discolored, distrophic, friable and brittle with yellow-brown discoloration digite 3,4,5 left and 1-5 right. A previous amputation of the left 2nd digit due to a hammertoe is noted. She has calluses at the tips of digits 3,4 left. A bruise is growing out on the right hallux. Patient subjectively relates they are painful with shoes and with ambulation of bilateral feet.  Assessment: Symptomatic onychomycosis  Plan: Discussed treatment options and alternatives. The symptomatic toenails were debrided through manual an mechanical means without complication. Return appointment recommended at routine intervals of 3 months  Kathryn Egerton, DPM  

## 2013-12-09 ENCOUNTER — Ambulatory Visit (INDEPENDENT_AMBULATORY_CARE_PROVIDER_SITE_OTHER): Payer: Medicare Other | Admitting: Podiatrist

## 2013-12-09 ENCOUNTER — Encounter: Payer: Self-pay | Admitting: Podiatrist

## 2013-12-09 DIAGNOSIS — M79609 Pain in unspecified limb: Secondary | ICD-10-CM

## 2013-12-09 DIAGNOSIS — B351 Tinea unguium: Secondary | ICD-10-CM

## 2013-12-09 DIAGNOSIS — M79676 Pain in unspecified toe(s): Principal | ICD-10-CM

## 2013-12-13 NOTE — Progress Notes (Signed)
HPI: Patient presents today for follow up of foot and nail care. Denies any new complaints today.  Objective: Patients chart is reviewed. Vascular status reveals pedal pulses noted at 2 out of 4 dp and pt bilateral . Neurological sensation is Normal to Semmes Weinstein monofilament bilateral. Patients nails are thickened, discolored, distrophic, friable and brittle with yellow-brown discoloration digite 3,4,5 left and 1-5 right. A previous amputation of the left 2nd digit due to a hammertoe is noted. She has calluses at the tips of digits 3,4 left. A bruise is growing out on the right hallux. Patient subjectively relates they are painful with shoes and with ambulation of bilateral feet.  Assessment: Symptomatic onychomycosis  Plan: Discussed treatment options and alternatives. The symptomatic toenails were debrided through manual an mechanical means without complication. Return appointment recommended at routine intervals of 3 months  Mumtaz Lovins, DPM  

## 2014-02-23 ENCOUNTER — Ambulatory Visit (INDEPENDENT_AMBULATORY_CARE_PROVIDER_SITE_OTHER): Payer: Medicare Other | Admitting: Podiatrist

## 2014-02-23 ENCOUNTER — Encounter: Payer: Self-pay | Admitting: Podiatrist

## 2014-02-23 DIAGNOSIS — B351 Tinea unguium: Secondary | ICD-10-CM

## 2014-02-23 DIAGNOSIS — M79676 Pain in unspecified toe(s): Secondary | ICD-10-CM

## 2014-03-07 NOTE — Progress Notes (Signed)
HPI: Patient presents today for follow up of foot and nail care. Denies any new complaints today.  Objective: Patients chart is reviewed. Vascular status reveals pedal pulses noted at 2 out of 4 dp and pt bilateral . Neurological sensation is Normal to [facility] bilateral. Patients nails are thickened, discolored, distrophic, friable and brittle with yellow-brown discoloration digite 3,4,5 left and 1-5 right. A previous amputation of the left 2nd digit due to a hammertoe is noted. She has calluses at the tips of digits 3,4 left. A bruise is growing out on the right hallux. Patient subjectively relates they are painful with shoes and with ambulation of bilateral feet.  Assessment: Symptomatic onychomycosis  Plan: Discussed treatment options and alternatives. The symptomatic toenails were debrided through manual an mechanical means without complication. Return appointment recommended at routine intervals of 3 months  Marlowe AschoffKathryn Egerton, DPM

## 2014-04-28 ENCOUNTER — Encounter: Payer: Self-pay | Admitting: Podiatrist

## 2014-04-28 ENCOUNTER — Ambulatory Visit (INDEPENDENT_AMBULATORY_CARE_PROVIDER_SITE_OTHER): Payer: Medicare Other | Admitting: Podiatrist

## 2014-04-28 DIAGNOSIS — M79676 Pain in unspecified toe(s): Secondary | ICD-10-CM

## 2014-04-28 DIAGNOSIS — B351 Tinea unguium: Secondary | ICD-10-CM

## 2014-04-28 NOTE — Progress Notes (Signed)
HPI: Patient presents today for follow up of foot and nail care. Denies any new complaints today.  Objective: Patients chart is reviewed. Vascular status reveals pedal pulses noted at 2 out of 4 dp and pt bilateral . Neurological sensation is Normal to [facility] bilateral. Patients nails are thickened, discolored, distrophic, friable and brittle with yellow-brown discoloration digite 3,4,5 left and 1-5 right. A previous amputation of the left 2nd digit due to a hammertoe is noted. She has calluses at the tips of digits 3,4 left. A bruise is growing out on the right hallux. Patient subjectively relates they are painful with shoes and with ambulation of bilateral feet.  Assessment: Symptomatic onychomycosis  Plan: Discussed treatment options and alternatives. The symptomatic toenails were debrided through manual an mechanical means without complication. Return appointment recommended at routine intervals of 3 months

## 2014-06-23 ENCOUNTER — Ambulatory Visit (INDEPENDENT_AMBULATORY_CARE_PROVIDER_SITE_OTHER): Payer: Medicare Other | Admitting: Interventional Cardiology

## 2014-06-23 ENCOUNTER — Encounter: Payer: Self-pay | Admitting: Interventional Cardiology

## 2014-06-23 VITALS — BP 140/90 | HR 66 | Ht 60.0 in | Wt 229.0 lb

## 2014-06-23 DIAGNOSIS — I10 Essential (primary) hypertension: Secondary | ICD-10-CM

## 2014-06-23 DIAGNOSIS — E785 Hyperlipidemia, unspecified: Secondary | ICD-10-CM | POA: Insufficient documentation

## 2014-06-23 DIAGNOSIS — R600 Localized edema: Secondary | ICD-10-CM

## 2014-06-23 NOTE — Patient Instructions (Addendum)
Medication Instructions:   Your physician recommends that you continue on your current medications as directed. Please refer to the Current Medication list given to you today.   Labwork:  NONE TODAY  Testing/Procedures:  NONE TODAY   Follow-Up:  AS NEEDED WITH DR Eldridge DaceVARANASI  Any Other Special Instructions Will Be Listed Below (If Applicable).  CONTINUE CHECK BLOOD PRESSURE AT HOME   CALL THE OFFICE  IF YOU HAVE ANY QUESTIONS OR CONCERNS

## 2014-06-23 NOTE — Progress Notes (Signed)
Patient ID: [name], female   DOB: October 17, 1929, 81 y.o.   MRN: [id number]     Cardiology Office Note   Date:  06/23/2014   ID:  Michele ShoeConstance J Brown, DOB October 17, 1929, MRN [id number]  PCP:  Gweneth DimitriMCNEILL,WENDY, MD    No chief complaint on file. HTN   Wt Readings from Last 3 Encounters:  06/23/14 229 lb (103.874 kg)  01/13/13 210 lb (95.255 kg)  10/31/10 231 lb 6.4 oz (104.962 kg)       History of Present Illness: [name] is a 81 y.o. female  Who has had edema and HTN.  Her BP has been well controlled. SHe denies any chest pain and SHOB.  She walks regularly, but this has decreased since she had a toe amputation, due to it being crooked.  She has trouble getting shoes that are comfortable.    Her BPs have been well controlled.  Her edema is also well controlled.  Systolics are typically less than 140 mm Hg.  She walks in the grocery store holding a cart for exercise.    Of note, she had a negative nuclear stress test in 2012 prior to knee replacement. She has had difficulty losing weight over the years. She has also had difficulty tolerating statins.    Past Medical History  Diagnosis Date  . Hypertension   . Thyroid disease   . Hemorrhoids     Past Surgical History  Procedure Laterality Date  . Cholecystectomy    . Appendectomy    . Tubal ligation    . Joint replacement      left knee     Current Outpatient Prescriptions  Medication Sig Dispense Refill  . amLODipine (NORVASC) 5 MG tablet Take 5 mg by mouth daily.      . Cholecalciferol (VITAMIN D PO) Take 2,000 Units by mouth daily.      Marland Kitchen. levothyroxine (SYNTHROID, LEVOTHROID) 125 MCG tablet Take 125 mcg by mouth daily.      Marland Kitchen. losartan (COZAAR) 100 MG tablet Take 100 mg by mouth daily.       No current facility-administered medications for this visit.    Allergies:   Contrast media; Penicillins; and Codeine    Social History:  The patient  reports that she has never smoked. She does not have any  smokeless tobacco history on file. She reports that she does not drink alcohol or use illicit drugs.   Family History:  The patient's family history includes Cancer in her father.    ROS:  Please see the history of present illness.   Otherwise, review of systems are positive for ankle edema.   All other systems are reviewed and negative.    PHYSICAL EXAM: VS:  BP 140/90 mmHg  Pulse 66  Ht 5' (1.524 m)  Wt 229 lb (103.874 kg)  BMI 44.72 kg/m2 , BMI Body mass index is 44.72 kg/(m^2). GEN: Well nourished, well developed, in no acute distress HEENT: normal Neck: no JVD, carotid bruits, or masses Cardiac: RRR; no murmurs, rubs, or gallops, bilateral ankle edema  Respiratory:  clear to auscultation bilaterally, normal work of breathing GI: soft, nontender, nondistended, + BS MS: no deformity or atrophy Skin: warm and dry, no rash Neuro:  Strength and sensation are intact Psych: euthymic mood, full affect   EKG:   The ekg ordered today demonstrates NSR, no ST segment   Recent Labs: No results found for requested labs within last 365 days.   Lipid Panel No results  found for: CHOL, TRIG, HDL, CHOLHDL, VLDL, LDLCALC, LDLDIRECT   Other studies Reviewed: Additional studies/ records that were reviewed today with results demonstrating: Prior stress test reviewed showing no ischemia and normal LV function from 2012.   ASSESSMENT AND PLAN:  1. Hypertension: Well controlled. Continue current medicines. She will follow-up with Dr. Corliss Blacker  2. Edema: Elevate legs. She has not had success using compression stockings because they're difficult to put on her legs. Continue to restrict salt in her diet. 3. Obesity: Continue attempts at weight loss through increased exercise and diet control. 4. Hyperlipidemia: She has not tolerated statins including low-dose Crestor in the past. Red yeast Rice would be a possibility for her. She will follow-up with Dr. Corliss Blacker.   Current medicines are  reviewed at length with the patient today.  The patient concerns regarding her medicines were addressed.  The following changes have been made:  No change  Labs/ tests ordered today include: None  No orders of the defined types were placed in this encounter.    Recommend 150 minutes/week of aerobic exercise Low fat, low carb, high fiber diet recommended  Disposition:   FU when necessary   Delorise Jackson., MD  06/23/2014 10:10 AM    [facility] [address] [place], [place], [place]  [address] Phone: [phone]; Fax: [phone]

## 2014-07-07 ENCOUNTER — Ambulatory Visit: Payer: Medicare Other

## 2014-07-13 ENCOUNTER — Ambulatory Visit (INDEPENDENT_AMBULATORY_CARE_PROVIDER_SITE_OTHER): Payer: Medicare Other | Admitting: Podiatry

## 2014-07-13 DIAGNOSIS — M79676 Pain in unspecified toe(s): Secondary | ICD-10-CM | POA: Diagnosis not present

## 2014-07-13 DIAGNOSIS — B351 Tinea unguium: Secondary | ICD-10-CM | POA: Diagnosis not present

## 2014-07-17 NOTE — Progress Notes (Signed)
Patient ID: [name], female   DOB: 11-Jun-1929, 81 y.o.   MRN: [id number]  Subjective: 81 y.o.-year-old female returns the office today for painful, elongated, thickened toenails and calluses. Denies any redness or drainage around the nails/calluses. Denies any acute changes since last appointment and no new complaints today. Denies any systemic complaints such as fevers, chills, nausea, vomiting.   Objective: AAO 3, NAD DP/PT pulses palpable, CRT less than 3 seconds Protective sensation intact with Simms Weinstein monofilament, Achilles tendon reflex intact.  Nails hypertrophic, dystrophic, elongated, brittle, discolored 9. There is tenderness overlying these nails. There is no surrounding erythema or drainage along the nail sites. 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  Hyperkerotic lesions at the distal aspect of digits 3/4 on the left. Upon debridement, no underlying ulceration, drainage, or other signs of infection.  No open lesions or pre-ulcerative lesions are identified. No other areas of tenderness bilateral lower extremities. No overlying edema, erythema, increased warmth. No pain with calf compression, swelling, warmth, erythema.  Assessment: Patient presents with symptomatic onychomycosis  Plan: -Treatment options including alternatives, risks, complications were discussed -Nails sharply debrided 9 without complication/bleeding. -Hyperkerotic lesions sharply debrided x 2 without complications/bleeding.  -Discussed daily foot inspection. If there are any changes, to call the office immediately.  -Follow-up in 3 months or sooner if any problems are to arise. In the meantime, encouraged to call the office with any questions, concerns, changes symptoms.

## 2014-09-14 ENCOUNTER — Ambulatory Visit: Payer: Medicare Other | Admitting: Podiatry

## 2014-09-16 ENCOUNTER — Ambulatory Visit: Payer: Medicare Other

## 2014-09-29 ENCOUNTER — Ambulatory Visit: Payer: Medicare Other | Admitting: Podiatry

## 2015-05-07 ENCOUNTER — Emergency Department (HOSPITAL_COMMUNITY)
Admission: EM | Admit: 2015-05-07 | Discharge: 2015-05-07 | Disposition: A | Discharge status: Home / Self Care | Payer: Medicare Other | Source: Home / Self Care | Attending: Emergency Medicine | Admitting: Emergency Medicine

## 2015-05-07 ENCOUNTER — Emergency Department (HOSPITAL_COMMUNITY): Payer: Medicare Other

## 2015-05-07 ENCOUNTER — Encounter (HOSPITAL_COMMUNITY): Payer: Self-pay | Admitting: Emergency Medicine

## 2015-05-07 DIAGNOSIS — E039 Hypothyroidism, unspecified: Secondary | ICD-10-CM | POA: Diagnosis present

## 2015-05-07 DIAGNOSIS — Z86718 Personal history of other venous thrombosis and embolism: Secondary | ICD-10-CM

## 2015-05-07 DIAGNOSIS — Z7952 Long term (current) use of systemic steroids: Secondary | ICD-10-CM

## 2015-05-07 DIAGNOSIS — J45909 Unspecified asthma, uncomplicated: Secondary | ICD-10-CM | POA: Diagnosis present

## 2015-05-07 DIAGNOSIS — I10 Essential (primary) hypertension: Secondary | ICD-10-CM | POA: Insufficient documentation

## 2015-05-07 DIAGNOSIS — Z880 Allergy status to penicillin: Secondary | ICD-10-CM | POA: Insufficient documentation

## 2015-05-07 DIAGNOSIS — J208 Acute bronchitis due to other specified organisms: Secondary | ICD-10-CM | POA: Diagnosis present

## 2015-05-07 DIAGNOSIS — Z8719 Personal history of other diseases of the digestive system: Secondary | ICD-10-CM

## 2015-05-07 DIAGNOSIS — R1110 Vomiting, unspecified: Secondary | ICD-10-CM | POA: Insufficient documentation

## 2015-05-07 DIAGNOSIS — R32 Unspecified urinary incontinence: Secondary | ICD-10-CM | POA: Diagnosis present

## 2015-05-07 DIAGNOSIS — F419 Anxiety disorder, unspecified: Secondary | ICD-10-CM

## 2015-05-07 DIAGNOSIS — Z79899 Other long term (current) drug therapy: Secondary | ICD-10-CM

## 2015-05-07 DIAGNOSIS — Z91041 Radiographic dye allergy status: Secondary | ICD-10-CM

## 2015-05-07 DIAGNOSIS — J9601 Acute respiratory failure with hypoxia: Secondary | ICD-10-CM | POA: Diagnosis present

## 2015-05-07 DIAGNOSIS — R0602 Shortness of breath: Secondary | ICD-10-CM | POA: Diagnosis not present

## 2015-05-07 DIAGNOSIS — J209 Acute bronchitis, unspecified: Secondary | ICD-10-CM

## 2015-05-07 DIAGNOSIS — J441 Chronic obstructive pulmonary disease with (acute) exacerbation: Secondary | ICD-10-CM | POA: Diagnosis present

## 2015-05-07 DIAGNOSIS — Z90710 Acquired absence of both cervix and uterus: Secondary | ICD-10-CM

## 2015-05-07 DIAGNOSIS — Z885 Allergy status to narcotic agent status: Secondary | ICD-10-CM

## 2015-05-07 DIAGNOSIS — J440 Chronic obstructive pulmonary disease with acute lower respiratory infection: Secondary | ICD-10-CM | POA: Diagnosis not present

## 2015-05-07 DIAGNOSIS — E079 Disorder of thyroid, unspecified: Secondary | ICD-10-CM | POA: Insufficient documentation

## 2015-05-07 DIAGNOSIS — E785 Hyperlipidemia, unspecified: Secondary | ICD-10-CM | POA: Diagnosis present

## 2015-05-07 LAB — BASIC METABOLIC PANEL
Anion gap: 11 (ref 5–15)
BUN: 19 mg/dL (ref 6–20)
CHLORIDE: 103 mmol/L (ref 101–111)
CO2: 25 mmol/L (ref 22–32)
CREATININE: 0.91 mg/dL (ref 0.44–1.00)
Calcium: 9.4 mg/dL (ref 8.9–10.3)
GFR, EST NON AFRICAN AMERICAN: 57 mL/min — AB (ref >60)
Glucose, Bld: 133 mg/dL — ABNORMAL HIGH (ref 65–99)
POTASSIUM: 3.9 mmol/L (ref 3.5–5.1)
SODIUM: 139 mmol/L (ref 135–145)

## 2015-05-07 LAB — CBC WITH DIFFERENTIAL/PLATELET
BASOS ABS: 0 10*3/uL (ref 0.0–0.1)
BASOS PCT: 0 %
EOS ABS: 0.1 10*3/uL (ref 0.0–0.7)
EOS PCT: 1 %
HCT: 43.8 % (ref 36.0–46.0)
Hemoglobin: 14.2 g/dL (ref 12.0–15.0)
LYMPHS ABS: 0.8 10*3/uL (ref 0.7–4.0)
Lymphocytes Relative: 11 %
MCH: 29.9 pg (ref 26.0–34.0)
MCHC: 32.4 g/dL (ref 30.0–36.0)
MCV: 92.2 fL (ref 78.0–100.0)
MONO ABS: 0.8 10*3/uL (ref 0.1–1.0)
Monocytes Relative: 11 %
NEUTROS PCT: 77 %
Neutro Abs: 5.6 10*3/uL (ref 1.7–7.7)
PLATELETS: 205 10*3/uL (ref 150–400)
RBC: 4.75 MIL/uL (ref 3.87–5.11)
RDW: 14 % (ref 11.5–15.5)
WBC: 7.3 10*3/uL (ref 4.0–10.5)

## 2015-05-07 MED ORDER — PREDNISONE 50 MG PO TABS: ORAL_TABLET | ORAL | Status: DC

## 2015-05-07 MED ORDER — ALBUTEROL SULFATE (2.5 MG/3ML) 0.083% IN NEBU
5.0000 mg | INHALATION_SOLUTION | Freq: Once | RESPIRATORY_TRACT | Status: AC
  Administered 2015-05-07: 5 mg via RESPIRATORY_TRACT
  Filled 2015-05-07: qty 6

## 2015-05-07 MED ORDER — PREDNISONE 20 MG PO TABS
60.0000 mg | ORAL_TABLET | Freq: Once | ORAL | Status: AC
  Administered 2015-05-07: 60 mg via ORAL
  Filled 2015-05-07: qty 3

## 2015-05-07 MED ORDER — ALBUTEROL SULFATE HFA 108 (90 BASE) MCG/ACT IN AERS
2.0000 | INHALATION_SPRAY | Freq: Once | RESPIRATORY_TRACT | Status: AC
  Administered 2015-05-07: 2 via RESPIRATORY_TRACT
  Filled 2015-05-07: qty 6.7

## 2015-05-07 MED ORDER — IPRATROPIUM BROMIDE 0.02 % IN SOLN
0.5000 mg | Freq: Once | RESPIRATORY_TRACT | Status: AC
  Administered 2015-05-07: 0.5 mg via RESPIRATORY_TRACT
  Filled 2015-05-07: qty 2.5

## 2015-05-07 NOTE — ED Provider Notes (Signed)
CSN: [id number]     Arrival date & time 05/07/15  0745 History   First MD Initiated Contact with Patient 05/07/15 [id number]     Chief Complaint  Patient presents with  . Cough     Patient is a 82 y.o. female presenting with cough. The history is provided by the patient.  Cough Cough characteristics:  Productive Sputum characteristics:  Yellow Severity:  Moderate Onset quality:  Gradual Duration:  5 days Timing:  Intermittent Progression:  Worsening Chronicity:  New Smoker: no   Relieved by:  Nothing Worsened by:  Nothing tried Associated symptoms: no chest pain, no fever and no shortness of breath   Patient reports she started coughing on 2/14 She reports yellow phlegm No hemoptysis No SOB No CP but does report rib/back pain/soreness from coughing No fever No sore throat Seen by PCP urgent clinic on 2/17, started on zpack/tessalon but no improvement She reports PCP was concerned she may have "blood clot" as 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   No new LE pain/edema   Past Medical History  Diagnosis Date  . Hypertension   . Thyroid disease   . Hemorrhoids    Past Surgical History  Procedure Laterality Date  . Cholecystectomy    . Appendectomy    . Tubal ligation    . Joint replacement      left knee   Family History  Problem Relation Age of Onset  . Cancer Father    Social History  Substance Use Topics  . Smoking status: Never Smoker   . Smokeless tobacco: None  . Alcohol Use: No   OB History    No data available     Review of Systems  Constitutional: Negative for fever.  Respiratory: Positive for cough. Negative for shortness of breath.   Cardiovascular: Negative for chest pain.  Gastrointestinal: Positive for vomiting.       Vomited once in the ED   Neurological: Negative for syncope.  Psychiatric/Behavioral: The patient is nervous/anxious.   All other systems reviewed and  are negative.     Allergies  Contrast media; Penicillins; and Codeine  Home Medications   Prior to Admission medications   Medication Sig Start Date End Date Taking? Authorizing Provider  amLODipine (NORVASC) 5 MG tablet Take 5 mg by mouth daily.      Historical Provider, MD  Cholecalciferol (VITAMIN D PO) Take 2,000 Units by mouth daily.      Historical Provider, MD  levothyroxine (SYNTHROID, LEVOTHROID) 125 MCG tablet Take 125 mcg by mouth daily.      Historical Provider, MD  losartan (COZAAR) 100 MG tablet Take 100 mg by mouth daily.      Historical Provider, MD   BP 176/75 mmHg  Pulse 80  Temp(Src) 98.1 F (36.7 C) (Oral)  Resp 20  SpO2 94% Physical Exam CONSTITUTIONAL: Well developed/well nourished HEAD: Normocephalic/atraumatic EYES: EOMI ENMT: Mucous membranes moist NECK: supple no meningeal signs SPINE/BACK:entire spine nontender CV: S1/S2 noted, no murmurs/rubs/gallops noted LUNGS:crackles in left base no apparent distress ABDOMEN: soft, nontender, no rebound or guarding GU:no cva tenderness NEURO: Pt is awake/alert/appropriate, moves all extremitiesx4.  No facial droop.   EXTREMITIES: pulses normal/equal, full ROM, chronic non pitting edema to bilateral LE.  No calf tenderness/erythema noted SKIN: warm, color normal PSYCH: mildly anxious  ED Course  Procedures  Medications  albuterol (PROVENTIL HFA;VENTOLIN HFA) 108 (90 Base) MCG/ACT  inhaler 2 puff (2 puffs Inhalation Given 05/07/15 0834)  albuterol (PROVENTIL) (2.5 MG/3ML) 0.083% nebulizer solution 5 mg (5 mg Nebulization Given 05/07/15 0951)  ipratropium (ATROVENT) nebulizer solution 0.5 mg (0.5 mg Nebulization Given 05/07/15 0951)  predniSONE (DELTASONE) tablet 60 mg (60 mg Oral Given 05/07/15 0951)    Labs Review Labs Reviewed  BASIC METABOLIC PANEL - Abnormal; Notable for the following:    Glucose, Bld 133 (*)    GFR calc non Af Amer 57 (*)    All other components within normal limits  CBC WITH  DIFFERENTIAL/PLATELET    Imaging Review Dg Chest 2 View  05/07/2015  CLINICAL DATA:  Pt c/o cough and difficulty breathing x3days. No there current chest complaints. Non smoker, HTN EXAM: CHEST  2 VIEW COMPARISON:  12/12/2010 FINDINGS: The heart size and mediastinal contours are within normal limits. Both lungs are clear. No pleural effusion or pneumothorax. Bony thorax is demineralized but intact. IMPRESSION: No acute cardiopulmonary disease. Electronically Signed   By: Amie Portland M.D.   On: 05/07/2015 08:32   I have personally reviewed and evaluated these images and lab results as part of my medical decision-making.   EKG Interpretation   Date/Time:  Sunday May 07 2015 09:22:40 EST Ventricular Rate:  75 PR Interval:  161 QRS Duration: 145 QT Interval:  446 QTC Calculation: 498 R Axis:   -19 Text Interpretation:  Sinus rhythm Ventricular premature complex Left  bundle branch block Abnormal ekg artifact noted which limits evaluation  Confirmed by Bebe Shaggy  MD, Jessie Cowher ([id number]) on 05/07/2015 9:36:42 AM     8:55 AM Pt well appearing Workup pending at this time 9:46 AM CXR clear Pt ambulated and felt well She now has bilateral wheezing Will give neb treatment with prednisone Pt denies CP I doubt ACS/PE at this time 10:30 AM Wheezing improved Pt feels improved No hypoxia She is comfortable going home We discussed strict ER return precautions  MDM   Final diagnoses:  Acute bronchitis, unspecified organism    Nursing notes including past medical history and social history reviewed and considered in documentation xrays/imaging reviewed by myself and considered during evaluation Labs/vital reviewed myself and considered during evaluation     Zadie Rhine, MD 05/07/15 1030

## 2015-05-07 NOTE — ED Notes (Signed)
Cough x 1 week, went to PCP Friday and received tessalon pearls and a z-pack. Her PCP had told her if she was feeling worse by today to come in. Hx of blood clots in legs, and the PCP was worried she may need a doppler or CT scan.

## 2015-05-07 NOTE — ED Notes (Signed)
Pt walked down hall O2 stats stayed within 94-95 pulse 97-100

## 2015-05-07 NOTE — Discharge Instructions (Signed)

## 2015-05-09 ENCOUNTER — Encounter (HOSPITAL_COMMUNITY): Payer: Medicare Other

## 2015-05-09 ENCOUNTER — Emergency Department (HOSPITAL_COMMUNITY): Payer: Medicare Other

## 2015-05-09 ENCOUNTER — Inpatient Hospital Stay (HOSPITAL_COMMUNITY)
Admission: EM | Admit: 2015-05-09 | Discharge: 2015-05-10 | DRG: 190 | Disposition: A | Discharge status: Home / Self Care | Payer: Medicare Other | Attending: Internal Medicine | Admitting: Internal Medicine

## 2015-05-09 ENCOUNTER — Encounter (HOSPITAL_COMMUNITY): Payer: Self-pay | Admitting: Emergency Medicine

## 2015-05-09 ENCOUNTER — Inpatient Hospital Stay (HOSPITAL_COMMUNITY): Payer: Medicare Other

## 2015-05-09 DIAGNOSIS — J208 Acute bronchitis due to other specified organisms: Secondary | ICD-10-CM | POA: Diagnosis not present

## 2015-05-09 DIAGNOSIS — E038 Other specified hypothyroidism: Secondary | ICD-10-CM | POA: Diagnosis not present

## 2015-05-09 DIAGNOSIS — J209 Acute bronchitis, unspecified: Secondary | ICD-10-CM | POA: Diagnosis present

## 2015-05-09 DIAGNOSIS — Z90710 Acquired absence of both cervix and uterus: Secondary | ICD-10-CM | POA: Diagnosis not present

## 2015-05-09 DIAGNOSIS — E785 Hyperlipidemia, unspecified: Secondary | ICD-10-CM | POA: Diagnosis present

## 2015-05-09 DIAGNOSIS — Z885 Allergy status to narcotic agent status: Secondary | ICD-10-CM | POA: Diagnosis not present

## 2015-05-09 DIAGNOSIS — Z880 Allergy status to penicillin: Secondary | ICD-10-CM | POA: Diagnosis not present

## 2015-05-09 DIAGNOSIS — Z7952 Long term (current) use of systemic steroids: Secondary | ICD-10-CM | POA: Diagnosis not present

## 2015-05-09 DIAGNOSIS — J9601 Acute respiratory failure with hypoxia: Secondary | ICD-10-CM | POA: Diagnosis not present

## 2015-05-09 DIAGNOSIS — R0602 Shortness of breath: Secondary | ICD-10-CM | POA: Diagnosis not present

## 2015-05-09 DIAGNOSIS — Z86718 Personal history of other venous thrombosis and embolism: Secondary | ICD-10-CM | POA: Diagnosis not present

## 2015-05-09 DIAGNOSIS — Z91041 Radiographic dye allergy status: Secondary | ICD-10-CM | POA: Diagnosis not present

## 2015-05-09 DIAGNOSIS — Z79899 Other long term (current) drug therapy: Secondary | ICD-10-CM | POA: Diagnosis not present

## 2015-05-09 DIAGNOSIS — I10 Essential (primary) hypertension: Secondary | ICD-10-CM | POA: Diagnosis not present

## 2015-05-09 DIAGNOSIS — R32 Unspecified urinary incontinence: Secondary | ICD-10-CM | POA: Diagnosis present

## 2015-05-09 DIAGNOSIS — J45909 Unspecified asthma, uncomplicated: Secondary | ICD-10-CM | POA: Diagnosis present

## 2015-05-09 DIAGNOSIS — J440 Chronic obstructive pulmonary disease with acute lower respiratory infection: Secondary | ICD-10-CM | POA: Diagnosis present

## 2015-05-09 DIAGNOSIS — E039 Hypothyroidism, unspecified: Secondary | ICD-10-CM | POA: Diagnosis present

## 2015-05-09 DIAGNOSIS — J441 Chronic obstructive pulmonary disease with (acute) exacerbation: Secondary | ICD-10-CM | POA: Diagnosis not present

## 2015-05-09 LAB — COMPREHENSIVE METABOLIC PANEL
ALBUMIN: 4.2 g/dL (ref 3.5–5.0)
ALT: 27 U/L (ref 14–54)
ANION GAP: 12 (ref 5–15)
AST: 54 U/L — AB (ref 15–41)
Alkaline Phosphatase: 72 U/L (ref 38–126)
BUN: 26 mg/dL — ABNORMAL HIGH (ref 6–20)
CALCIUM: 9.1 mg/dL (ref 8.9–10.3)
CO2: 24 mmol/L (ref 22–32)
Chloride: 104 mmol/L (ref 101–111)
Creatinine, Ser: 0.9 mg/dL (ref 0.44–1.00)
GFR calc non Af Amer: 58 mL/min — ABNORMAL LOW (ref >60)
Glucose, Bld: 124 mg/dL — ABNORMAL HIGH (ref 65–99)
POTASSIUM: 3.5 mmol/L (ref 3.5–5.1)
Sodium: 140 mmol/L (ref 135–145)
TOTAL PROTEIN: 8.2 g/dL — AB (ref 6.5–8.1)
Total Bilirubin: 0.6 mg/dL (ref 0.3–1.2)

## 2015-05-09 LAB — INFLUENZA PANEL BY PCR (TYPE A & B)
H1N1 flu by pcr: NOT DETECTED
INFLAPCR: NEGATIVE
Influenza B By PCR: NEGATIVE

## 2015-05-09 LAB — CBC
HEMATOCRIT: 45.5 % (ref 36.0–46.0)
Hemoglobin: 13.8 g/dL (ref 12.0–15.0)
MCH: 28.4 pg (ref 26.0–34.0)
MCHC: 30.3 g/dL (ref 30.0–36.0)
MCV: 93.6 fL (ref 78.0–100.0)
PLATELETS: 220 10*3/uL (ref 150–400)
RBC: 4.86 MIL/uL (ref 3.87–5.11)
RDW: 14.2 % (ref 11.5–15.5)
WBC: 8.6 10*3/uL (ref 4.0–10.5)

## 2015-05-09 LAB — URINALYSIS, ROUTINE W REFLEX MICROSCOPIC
Bilirubin Urine: NEGATIVE
GLUCOSE, UA: NEGATIVE mg/dL
KETONES UR: NEGATIVE mg/dL
Leukocytes, UA: NEGATIVE
Nitrite: NEGATIVE
PROTEIN: 100 mg/dL — AB
Specific Gravity, Urine: 1.01 (ref 1.005–1.030)
pH: 6 (ref 5.0–8.0)

## 2015-05-09 LAB — TSH: TSH: 0.426 u[IU]/mL (ref 0.350–4.500)

## 2015-05-09 LAB — TROPONIN I

## 2015-05-09 LAB — URINE MICROSCOPIC-ADD ON

## 2015-05-09 LAB — D-DIMER, QUANTITATIVE (NOT AT ARMC): D DIMER QUANT: 1.36 ug{FEU}/mL — AB (ref 0.00–0.50)

## 2015-05-09 LAB — MAGNESIUM: MAGNESIUM: 1.9 mg/dL (ref 1.7–2.4)

## 2015-05-09 MED ORDER — AMLODIPINE BESYLATE 5 MG PO TABS: 5.0000 mg | ORAL_TABLET | Freq: Every day | ORAL | Status: DC

## 2015-05-09 MED ORDER — SODIUM CHLORIDE 0.9 % IV SOLN
INTRAVENOUS | Status: DC
Start: 2015-05-09 — End: 2015-05-10
  Administered 2015-05-09 (×2): via INTRAVENOUS

## 2015-05-09 MED ORDER — POLYETHYLENE GLYCOL 3350 17 G PO PACK
17.0000 g | PACK | Freq: Every day | ORAL | Status: DC | PRN
  Filled 2015-05-09: qty 1

## 2015-05-09 MED ORDER — LOSARTAN POTASSIUM 50 MG PO TABS
100.0000 mg | ORAL_TABLET | Freq: Every day | ORAL | Status: DC
  Administered 2015-05-09 – 2015-05-10 (×2): 100 mg via ORAL
  Filled 2015-05-09 (×2): qty 2

## 2015-05-09 MED ORDER — METHYLPREDNISOLONE SODIUM SUCC 125 MG IJ SOLR
125.0000 mg | Freq: Once | INTRAMUSCULAR | Status: AC
  Administered 2015-05-09: 125 mg via INTRAVENOUS
  Filled 2015-05-09: qty 2

## 2015-05-09 MED ORDER — ENOXAPARIN SODIUM 40 MG/0.4ML ~~LOC~~ SOLN
40.0000 mg | SUBCUTANEOUS | Status: DC
  Administered 2015-05-09: 40 mg via SUBCUTANEOUS
  Filled 2015-05-09: qty 0.4

## 2015-05-09 MED ORDER — DEXTROSE 5 % IV SOLN
500.0000 mg | INTRAVENOUS | Status: DC
  Administered 2015-05-09: 500 mg via INTRAVENOUS
  Filled 2015-05-09: qty 500

## 2015-05-09 MED ORDER — LEVOTHYROXINE SODIUM 25 MCG PO TABS
125.0000 ug | ORAL_TABLET | ORAL | Status: DC
  Administered 2015-05-10: 125 ug via ORAL
  Filled 2015-05-09 (×2): qty 1

## 2015-05-09 MED ORDER — SODIUM CHLORIDE 0.9% FLUSH
3.0000 mL | Freq: Two times a day (BID) | INTRAVENOUS | Status: DC
Start: 2015-05-09 — End: 2015-05-10
  Administered 2015-05-10: 3 mL via INTRAVENOUS

## 2015-05-09 MED ORDER — LEVOTHYROXINE SODIUM 125 MCG PO TABS: 62.5000 ug | ORAL_TABLET | Freq: Every day | ORAL | Status: DC

## 2015-05-09 MED ORDER — TECHNETIUM TO 99M ALBUMIN AGGREGATED
4.1000 | Freq: Once | INTRAVENOUS | Status: AC | PRN
  Administered 2015-05-09: 4 via INTRAVENOUS

## 2015-05-09 MED ORDER — GUAIFENESIN-DM 100-10 MG/5ML PO SYRP
5.0000 mL | ORAL_SOLUTION | ORAL | Status: DC | PRN
  Filled 2015-05-09 (×2): qty 10

## 2015-05-09 MED ORDER — ONDANSETRON HCL 4 MG PO TABS: 4.0000 mg | ORAL_TABLET | Freq: Four times a day (QID) | ORAL | Status: DC | PRN

## 2015-05-09 MED ORDER — HYDRALAZINE HCL 20 MG/ML IJ SOLN
5.0000 mg | INTRAMUSCULAR | Status: DC | PRN
  Administered 2015-05-09: 5 mg via INTRAVENOUS
  Filled 2015-05-09: qty 1

## 2015-05-09 MED ORDER — ALBUTEROL SULFATE (2.5 MG/3ML) 0.083% IN NEBU
5.0000 mg | INHALATION_SOLUTION | RESPIRATORY_TRACT | Status: DC | PRN
  Administered 2015-05-09 – 2015-05-10 (×2): 5 mg via RESPIRATORY_TRACT
  Filled 2015-05-09 (×2): qty 6

## 2015-05-09 MED ORDER — LEVALBUTEROL HCL 0.63 MG/3ML IN NEBU
0.6300 mg | INHALATION_SOLUTION | Freq: Four times a day (QID) | RESPIRATORY_TRACT | Status: DC | PRN
  Administered 2015-05-10: 0.63 mg via RESPIRATORY_TRACT
  Filled 2015-05-09: qty 3

## 2015-05-09 MED ORDER — ONDANSETRON HCL 4 MG/2ML IJ SOLN: 4.0000 mg | Freq: Four times a day (QID) | INTRAMUSCULAR | Status: DC | PRN

## 2015-05-09 MED ORDER — OXYCODONE HCL 5 MG PO TABS
5.0000 mg | ORAL_TABLET | ORAL | Status: DC | PRN
Start: 2015-05-09 — End: 2015-05-10

## 2015-05-09 MED ORDER — LEVOTHYROXINE SODIUM 50 MCG PO TABS: 62.5000 ug | ORAL_TABLET | ORAL | Status: DC

## 2015-05-09 MED ORDER — IPRATROPIUM-ALBUTEROL 0.5-2.5 (3) MG/3ML IN SOLN
3.0000 mL | Freq: Once | RESPIRATORY_TRACT | Status: AC
  Administered 2015-05-09: 3 mL via RESPIRATORY_TRACT
  Filled 2015-05-09: qty 3

## 2015-05-09 MED ORDER — TECHNETIUM TC 99M DIETHYLENETRIAME-PENTAACETIC ACID: 32.1000 | Freq: Once | INTRAVENOUS | Status: DC | PRN

## 2015-05-09 MED ORDER — IPRATROPIUM-ALBUTEROL 0.5-2.5 (3) MG/3ML IN SOLN: 3.0000 mL | Freq: Once | RESPIRATORY_TRACT | Status: DC

## 2015-05-09 MED ORDER — ACETAMINOPHEN 325 MG PO TABS: 650.0000 mg | ORAL_TABLET | Freq: Four times a day (QID) | ORAL | Status: DC | PRN

## 2015-05-09 MED ORDER — ACETAMINOPHEN 650 MG RE SUPP: 650.0000 mg | Freq: Four times a day (QID) | RECTAL | Status: DC | PRN

## 2015-05-09 MED ORDER — AMLODIPINE BESYLATE 5 MG PO TABS
5.0000 mg | ORAL_TABLET | Freq: Every day | ORAL | Status: DC
  Administered 2015-05-10: 5 mg via ORAL
  Filled 2015-05-09: qty 1

## 2015-05-09 MED ORDER — METHYLPREDNISOLONE SODIUM SUCC 40 MG IJ SOLR
40.0000 mg | Freq: Four times a day (QID) | INTRAMUSCULAR | Status: DC
  Administered 2015-05-09 – 2015-05-10 (×4): 40 mg via INTRAVENOUS
  Filled 2015-05-09 (×4): qty 1

## 2015-05-09 MED ORDER — AMLODIPINE BESYLATE 5 MG PO TABS
5.0000 mg | ORAL_TABLET | Freq: Every day | ORAL | Status: DC
  Administered 2015-05-09: 5 mg via ORAL
  Filled 2015-05-09: qty 1

## 2015-05-09 MED ORDER — IPRATROPIUM BROMIDE 0.02 % IN SOLN
0.5000 mg | Freq: Once | RESPIRATORY_TRACT | Status: AC
  Administered 2015-05-09: 0.5 mg via RESPIRATORY_TRACT
  Filled 2015-05-09: qty 2.5

## 2015-05-09 MED ORDER — LEVOFLOXACIN IN D5W 500 MG/100ML IV SOLN
500.0000 mg | INTRAVENOUS | Status: DC
  Administered 2015-05-09: 500 mg via INTRAVENOUS
  Filled 2015-05-09: qty 100

## 2015-05-09 NOTE — ED Notes (Signed)
Per pt, states same symptoms on Sunday-states shortness of breath getting worse-states unable to lay down-decreased activity

## 2015-05-09 NOTE — ED Notes (Signed)
Pt off the floor for VQ SCAN

## 2015-05-09 NOTE — ED Notes (Signed)
Attending MD notified of elevated D-Dimer per order.

## 2015-05-09 NOTE — H&P (Signed)
[facility] History and Physical  DRINDA BELGARD ZOX:[id number] DOB: 1929/10/24 DOA: 05/09/2015  Referring physician: ER  PCP: Gweneth Dimitri, MD   Chief Complaint: Shortness of breath   HPI:   82 year old female with a history of hypertension, hypothyroidism presented to the ER with a one-week history of not feeling well. Initially it was a dry cough that started one week ago. Soon after that the patient started wheezing. She went to urgent care and was prescribed Z-Pak which did not help her symptoms. Now she has a rattle  and upper airway wheeze which makes her a very short of breath when she walks.She denies any fever, chills, chest pain,. She has a history of multiple blood clots in both her legs. She was given Coumadin several years ago. Patient is being admitted after 2 ED visits and one urgent care visit for suspected acute viral bronchitis with bronchospasm.      Review of Systems: negative for the following  Constitutional: Denies fever, chills, diaphoresis, appetite change and fatigue.  HEENT: Denies photophobia, eye pain, redness, hearing loss, ear pain, congestion, sore throat, rhinorrhea, sneezing, mouth sores, trouble swallowing, neck pain, neck stiffness and tinnitus.  Respiratory: Positive for cough, shortness of breath and wheezing Cardiovascular: Positive for chest pain, palpitations and leg swelling.  Gastrointestinal: Denies nausea, vomiting, abdominal pain, diarrhea, constipation, blood in stool and abdominal distention.  Genitourinary: Denies dysuria, urgency, frequency, hematuria, flank pain and difficulty urinating.  Musculoskeletal: Denies myalgias, back pain, joint swelling, arthralgias and gait problem.  Skin: Denies pallor, rash and wound.  Neurological: Denies dizziness, seizures, syncope, weakness, light-headedness, numbness and headaches.  Hematological: Denies adenopathy. Easy bruising, personal or family bleeding history  Psychiatric/Behavioral:  Denies suicidal ideation, mood changes, confusion, nervousness, sleep disturbance and agitation      Past Medical History  Diagnosis Date  . Hypertension   . Thyroid disease   . Hemorrhoids    3. History of left lower extremity DVT following arthroscopy and also  a second time she had multiple blood clots in her leg. 4. Urinary incontinence.  Past Surgical History  Procedure Laterality Date  . Cholecystectomy    . Appendectomy    . Tubal ligation    . Joint replacement      left knee     PAST SURGICAL HISTORY: 1. Hysterectomy in 1976. 2. Left knee replaced in 2003. 3. Tubal ligation. 4. Gallbladder surgery in 1999. 5. Cataract surgery in 2008, and again in 2010. 6. Colonoscopy procedure.  FAMILY HISTORY: Father deceased in his 59s with kidney issues, he had an urostomy. Mother deceased in her 39s.  SOCIAL HISTORY: Married, retired Engineer, civil (consulting), nonsmoker. No alcohol. She has 2 steps to entering her home. She does have a living will.     Allergies  Allergen Reactions  . Contrast Media [Iodinated Diagnostic Agents] Other (See Comments)    Causes patient to pass out  . Penicillins Anaphylaxis    Has patient had a PCN reaction causing immediate rash, facial/tongue/throat swelling, SOB or lightheadedness with hypotension: Yes Has patient had a PCN reaction causing severe rash involving mucus membranes or skin necrosis:  Unknown Has patient had a PCN reaction that required hospitalization: No  Has patient had a PCN reaction occurring within the last 10 years: No  If all of the above answers are "NO", then may proceed with Cephalosporin use.   . Codeine Nausea Only    severe      Prior to Admission medications   Medication Sig Start Date  End Date Taking? Authorizing Provider  amLODipine (NORVASC) 5 MG tablet Take 5 mg by mouth daily.     Yes Historical Provider, MD  Cholecalciferol (VITAMIN D) 2000 units tablet Take 4,000 Units by mouth daily.   Yes  Historical Provider, MD  levothyroxine (SYNTHROID, LEVOTHROID) 125 MCG tablet Take 62.5-125 mcg by mouth daily. Takes everyday except on Sunday's takes 69.   Yes Historical Provider, MD  losartan (COZAAR) 100 MG tablet Take 100 mg by mouth daily.     Yes Historical Provider, MD  predniSONE (DELTASONE) 50 MG tablet 1 tablet PO QD X4 days Patient taking differently: Take 50 mg by mouth daily. Started 02/19 X4 days 05/07/15  Yes Zadie Rhine, MD     Physical Exam: Filed Vitals:   05/09/15 0733 05/09/15 0812 05/09/15 0930  BP: 215/94  179/80  Pulse: 86  80  Temp: 98.4 F (36.9 C)    TempSrc: Oral    Resp: 23  20  SpO2: 93% 92% 94%     Constitutional: Vital signs reviewed. Patient is a well-developed and well-nourished in no acute distress and cooperative with exam. Alert and oriented x3.  Head: Normocephalic and atraumatic  Ear: TM normal bilaterally  Mouth: no erythema or exudates, MMM  Eyes: PERRL, EOMI, conjunctivae normal, No scleral icterus.  Neck: Supple, Trachea midline normal ROM, No JVD, mass, thyromegaly, or carotid bruit present.  Cardiovascular: RRR, S1 normal, S2 normal, no MRG, pulses symmetric and intact bilaterally  Pulmonary/Chest: Accessory muscle usage present. No stridor. No respiratory distress. She has wheezes. She has no rales.  Abdominal: Soft. Non-tender, non-distended, bowel sounds are normal, no masses, organomegaly, or guarding present.  GU: no CVA tenderness Musculoskeletal: No joint deformities, erythema, or stiffness, ROM full and no nontender Ext: no edema and no cyanosis, pulses palpable bilaterally (DP and PT)  Hematology: no cervical, inginal, or axillary adenopathy.  Neurological: A&O x3, Strenght is normal and symmetric bilaterally, cranial nerve II-XII are grossly intact, no focal motor deficit, sensory intact to light touch bilaterally.  Skin: Warm, dry and intact. No rash, cyanosis, or clubbing.  Psychiatric: Normal mood and affect.  speech and behavior is normal. Judgment and thought content normal. Cognition and memory are normal.      Data Review   Micro Results No results found for this or any previous visit (from the past 240 hour(s)).  Radiology Reports Dg Chest 2 View  05/09/2015  CLINICAL DATA:  Worsening cough, congestion and shortness of breath over the last 5 days EXAM: CHEST  2 VIEW COMPARISON:  05/07/2015 FINDINGS: There is hyperinflation of the lungs compatible with COPD. Heart and mediastinal contours are within normal limits. No focal opacities or effusions. No acute bony abnormality. IMPRESSION: COPD.  No active disease. Electronically Signed   By: Charlett Nose M.D.   On: 05/09/2015 08:00   Dg Chest 2 View  05/07/2015  CLINICAL DATA:  Pt c/o cough and difficulty breathing x3days. No there current chest complaints. Non smoker, HTN EXAM: CHEST  2 VIEW COMPARISON:  12/12/2010 FINDINGS: The heart size and mediastinal contours are within normal limits. Both lungs are clear. No pleural effusion or pneumothorax. Bony thorax is demineralized but intact. IMPRESSION: No acute cardiopulmonary disease. Electronically Signed   By: Amie Portland M.D.   On: 05/07/2015 08:32     CBC  Recent Labs Lab 05/07/15 0840 05/09/15 0749  WBC 7.3 8.6  HGB 14.2 13.8  HCT 43.8 45.5  PLT 205 220  MCV 92.2 93.6  MCH 29.9 28.4  MCHC 32.4 30.3  RDW 14.0 14.2  LYMPHSABS 0.8  --   MONOABS 0.8  --   EOSABS 0.1  --   BASOSABS 0.0  --     Chemistries   Recent Labs Lab 05/07/15 0840 05/09/15 0749  NA 139 140  K 3.9 3.5  CL 103 104  CO2 25 24  GLUCOSE 133* 124*  BUN 19 26*  CREATININE 0.91 0.90  CALCIUM 9.4 9.1  AST  --  54*  ALT  --  27  ALKPHOS  --  72  BILITOT  --  0.6   ------------------------------------------------------------------------------------------------------------------ CrCl cannot be calculated (Unknown ideal  weight.). ------------------------------------------------------------------------------------------------------------------ No results for input(s): HGBA1C in the last 72 hours. ------------------------------------------------------------------------------------------------------------------ No results for input(s): CHOL, HDL, LDLCALC, TRIG, CHOLHDL, LDLDIRECT in the last 72 hours. ------------------------------------------------------------------------------------------------------------------ No results for input(s): TSH, T4TOTAL, T3FREE, THYROIDAB in the last 72 hours.  Invalid input(s): FREET3 ------------------------------------------------------------------------------------------------------------------ No results for input(s): VITAMINB12, FOLATE, FERRITIN, TIBC, IRON, RETICCTPCT in the last 72 hours.  Coagulation profile No results for input(s): INR, PROTIME in the last 168 hours.  No results for input(s): DDIMER in the last 72 hours.  Cardiac Enzymes No results for input(s): CKMB, TROPONINI, MYOGLOBIN in the last 168 hours.  Invalid input(s): CK ------------------------------------------------------------------------------------------------------------------ Invalid input(s): POCBNP   CBG: No results for input(s): GLUCAP in the last 168 hours.     EKG: Independently reviewed.    Assessment/Plan Principal Problem:   Acute viral bronchitis with asthma Patient will be admitted to telemetry We'll start patient on Levaquin, just completed a course of azithromycin Nebs, IV steroids, Check influenza PCR, respiratory virus panel Given prior history of DVT, abnormal d-dimer, will check V/Q scan and venous Doppler to rule out PE and DVT     Essential hypertension-reduced dose of Cozaar and continue Norvasc  Hypothyroidism-continue Synthroid    Hyperlipidemia-Currently on no medications for this      Code Status History    This patient does not have a recorded code  status. Please follow your organizational policy for patients in this situation.      Family Communication: bedside Disposition Plan: admit   Total time spent 55 minutes.Greater than 50% of this time was spent in counseling, explanation of diagnosis, planning of further management, and coordination of care  [facility] Pager [phone]  If 7PM-7AM, please contact night-coverage [email or web address] Password [email or web address] 05/09/2015, 11:22 AM

## 2015-05-09 NOTE — ED Notes (Signed)
Pt attempted u/a sample. Pt unsuccessful due to amount of collect. Will retry.

## 2015-05-09 NOTE — ED Notes (Signed)
Pt back from NM in bathroom.

## 2015-05-09 NOTE — ED Notes (Signed)
MD at bedside. 

## 2015-05-09 NOTE — ED Provider Notes (Signed)
CSN: [id number]     Arrival date & time 05/09/15  6213 History   First MD Initiated Contact with Patient 05/09/15 [phone]     Chief Complaint  Patient presents with  . Shortness of Breath   HPI Comments: Pt started a cough 1 week ago.  She went to an urgent care and was given cough medications.  Pt was not getting better and came back to the ED on 2/19.  She had an evaluation and was treated for wheezing.  Dc with steroids and inhalers.  Last night she felt worse.  She had trouble breathing.  She felt rattling in her chest.  She has finished the abx and does not have any more steroids  Patient is a 82 y.o. female presenting with shortness of breath. The history is provided by the patient.  Shortness of Breath Severity:  Moderate Timing:  Constant Progression:  Worsening Relieved by:  Nothing Worsened by:  Coughing Associated symptoms: chest pain, cough and wheezing   Associated symptoms: no rash   Risk factors: hx of PE/DVT   Risk factors: no recent surgery and no tobacco use     Past Medical History  Diagnosis Date  . Hypertension   . Thyroid disease   . Hemorrhoids    Past Surgical History  Procedure Laterality Date  . Cholecystectomy    . Appendectomy    . Tubal ligation    . Joint replacement      left knee   Family History  Problem Relation Age of Onset  . Cancer Father    Social History  Substance Use Topics  . Smoking status: Never Smoker   . Smokeless tobacco: None  . Alcohol Use: No   OB History    No data available     Review of Systems  Respiratory: Positive for cough, shortness of breath and wheezing.   Cardiovascular: Positive for chest pain.  Skin: Negative for rash.  All other systems reviewed and are negative.     Allergies  Contrast media; Penicillins; and Codeine  Home Medications   Prior to Admission medications   Medication Sig Start Date End Date Taking? Authorizing Provider  amLODipine (NORVASC) 5 MG tablet Take 5 mg by mouth daily.      Yes Historical Provider, MD  Cholecalciferol (VITAMIN D) 2000 units tablet Take 4,000 Units by mouth daily.   Yes Historical Provider, MD  levothyroxine (SYNTHROID, LEVOTHROID) 125 MCG tablet Take 62.5-125 mcg by mouth daily. Takes everyday except on Sunday's takes 23.   Yes Historical Provider, MD  losartan (COZAAR) 100 MG tablet Take 100 mg by mouth daily.     Yes Historical Provider, MD  predniSONE (DELTASONE) 50 MG tablet 1 tablet PO QD X4 days Patient taking differently: Take 50 mg by mouth daily. Started 02/19 X4 days 05/07/15  Yes Zadie Rhine, MD   BP 179/80 mmHg  Pulse 80  Temp(Src) 98.4 F (36.9 C) (Oral)  Resp 20  SpO2 94% Physical Exam  Constitutional: She appears well-developed and well-nourished. No distress.  HENT:  Head: Normocephalic and atraumatic.  Right Ear: External ear normal.  Left Ear: External ear normal.  Eyes: Conjunctivae are normal. Right eye exhibits no discharge. Left eye exhibits no discharge. No scleral icterus.  Neck: Neck supple. No tracheal deviation present.  Cardiovascular: Normal rate, regular rhythm and intact distal pulses.   Pulmonary/Chest: Accessory muscle usage present. No stridor. No respiratory distress. She has wheezes. She has no rales.  Abdominal: Soft. Bowel sounds  are normal. She exhibits no distension. There is no tenderness. There is no rebound and no guarding.  Musculoskeletal: She exhibits no edema or tenderness.  Neurological: She is alert. She has normal strength. No cranial nerve deficit (no facial droop, extraocular movements intact, no slurred speech) or sensory deficit. She exhibits normal muscle tone. She displays no seizure activity. Coordination normal.  Skin: Skin is warm and dry. No rash noted.  Psychiatric: She has a normal mood and affect.  Nursing note and vitals reviewed.   ED Course  Procedures (including critical care time) Labs Review Labs Reviewed  COMPREHENSIVE METABOLIC PANEL - Abnormal;  Notable for the following:    Glucose, Bld 124 (*)    BUN 26 (*)    Total Protein 8.2 (*)    AST 54 (*)    GFR calc non Af Amer 58 (*)    All other components within normal limits  CBC    Imaging Review Dg Chest 2 View  05/09/2015  CLINICAL DATA:  Worsening cough, congestion and shortness of breath over the last 5 days EXAM: CHEST  2 VIEW COMPARISON:  05/07/2015 FINDINGS: There is hyperinflation of the lungs compatible with COPD. Heart and mediastinal contours are within normal limits. No focal opacities or effusions. No acute bony abnormality. IMPRESSION: COPD.  No active disease. Electronically Signed   By: Charlett Nose M.D.   On: 05/09/2015 08:00   I have personally reviewed and evaluated these images and lab results as part of my medical decision-making.  Medications  albuterol (PROVENTIL) (2.5 MG/3ML) 0.083% nebulizer solution 5 mg (not administered)  ipratropium-albuterol (DUONEB) 0.5-2.5 (3) MG/3ML nebulizer solution 3 mL (3 mLs Nebulization Given 05/09/15 0810)  methylPREDNISolone sodium succinate (SOLU-MEDROL) 125 mg/2 mL injection 125 mg (125 mg Intravenous Given 05/09/15 0919)  ipratropium (ATROVENT) nebulizer solution 0.5 mg (0.5 mg Nebulization Given 05/09/15 0919)     MDM   Final diagnoses:  Bronchitis with bronchospasm   Patient's symptoms improved after treatment with nebulizer treatments and steroids. However on repeat exam she is still having wheezing.  No pneumonia on chest x-ray. I suspect her symptoms are related to bronchitis with bronchospasm. She is oriented to all antibiotics and steroids and unfortunately presented back to the ED with worsening symptoms.  Considering her persistent wheezing on exam and failure to respond to outpatient treatment I will consult with  the medical service for admission and further treatment    Linwood Dibbles, MD 05/09/15 1035

## 2015-05-09 NOTE — ED Notes (Signed)
MD at bedside. Admitting  

## 2015-05-10 ENCOUNTER — Inpatient Hospital Stay (HOSPITAL_COMMUNITY): Payer: Medicare Other

## 2015-05-10 DIAGNOSIS — J441 Chronic obstructive pulmonary disease with (acute) exacerbation: Secondary | ICD-10-CM

## 2015-05-10 DIAGNOSIS — E038 Other specified hypothyroidism: Secondary | ICD-10-CM

## 2015-05-10 DIAGNOSIS — R0602 Shortness of breath: Secondary | ICD-10-CM

## 2015-05-10 DIAGNOSIS — J9601 Acute respiratory failure with hypoxia: Secondary | ICD-10-CM

## 2015-05-10 DIAGNOSIS — I10 Essential (primary) hypertension: Secondary | ICD-10-CM

## 2015-05-10 LAB — COMPREHENSIVE METABOLIC PANEL
ALBUMIN: 3.7 g/dL (ref 3.5–5.0)
ALK PHOS: 60 U/L (ref 38–126)
ALT: 30 U/L (ref 14–54)
AST: 54 U/L — AB (ref 15–41)
Anion gap: 11 (ref 5–15)
BUN: 30 mg/dL — AB (ref 6–20)
CALCIUM: 8.9 mg/dL (ref 8.9–10.3)
CHLORIDE: 107 mmol/L (ref 101–111)
CO2: 24 mmol/L (ref 22–32)
CREATININE: 0.94 mg/dL (ref 0.44–1.00)
GFR calc non Af Amer: 55 mL/min — ABNORMAL LOW (ref >60)
GLUCOSE: 157 mg/dL — AB (ref 65–99)
Potassium: 4 mmol/L (ref 3.5–5.1)
SODIUM: 142 mmol/L (ref 135–145)
Total Bilirubin: 0.6 mg/dL (ref 0.3–1.2)
Total Protein: 7.4 g/dL (ref 6.5–8.1)

## 2015-05-10 LAB — HEMOGLOBIN A1C
Hgb A1c MFr Bld: 6.3 % — ABNORMAL HIGH (ref 4.8–5.6)
MEAN PLASMA GLUCOSE: 134 mg/dL

## 2015-05-10 LAB — CBC
HCT: 44.6 % (ref 36.0–46.0)
Hemoglobin: 13.6 g/dL (ref 12.0–15.0)
MCH: 28.7 pg (ref 26.0–34.0)
MCHC: 30.5 g/dL (ref 30.0–36.0)
MCV: 94.1 fL (ref 78.0–100.0)
PLATELETS: 216 10*3/uL (ref 150–400)
RBC: 4.74 MIL/uL (ref 3.87–5.11)
RDW: 14.3 % (ref 11.5–15.5)
WBC: 6.7 10*3/uL (ref 4.0–10.5)

## 2015-05-10 LAB — RESPIRATORY VIRUS PANEL
Adenovirus: NEGATIVE
INFLUENZA A: NEGATIVE
INFLUENZA B 1: NEGATIVE
Metapneumovirus: POSITIVE — AB
PARAINFLUENZA 1 A: NEGATIVE
Parainfluenza 2: NEGATIVE
Parainfluenza 3: NEGATIVE
RESPIRATORY SYNCYTIAL VIRUS A: NEGATIVE
RESPIRATORY SYNCYTIAL VIRUS B: NEGATIVE
Rhinovirus: NEGATIVE

## 2015-05-10 MED ORDER — METHYLPREDNISOLONE SODIUM SUCC 40 MG IJ SOLR: 40.0000 mg | Freq: Two times a day (BID) | INTRAMUSCULAR | Status: DC

## 2015-05-10 MED ORDER — LEVOFLOXACIN 750 MG PO TABS
750.0000 mg | ORAL_TABLET | Freq: Every day | ORAL | Status: DC
Start: 2015-05-10 — End: 2015-12-21

## 2015-05-10 NOTE — Progress Notes (Signed)
VASCULAR LAB PRELIMINARY  PRELIMINARY  PRELIMINARY  PRELIMINARY  Bilateral lower extremity venous duplex  completed.    Preliminary report:  Bilateral:  No evidence of DVT, superficial thrombosis, or Baker's Cyst.    Airam Heidecker, RVT 05/10/2015, 9:01 AM

## 2015-05-10 NOTE — Discharge Instructions (Signed)
Cough, Adult Coughing is a reflex that clears your throat and your airways. Coughing helps to heal and protect your lungs. It is normal to cough occasionally, but a cough that happens with other symptoms or lasts a long time may be a sign of a condition that needs treatment. A cough may last only 2-3 weeks (acute), or it may last longer than 8 weeks (chronic). CAUSES Coughing is commonly caused by:  Breathing in substances that irritate your lungs.  A viral or bacterial respiratory infection.  Allergies.  Asthma.  Postnasal drip.  Smoking.  Acid backing up from the stomach into the esophagus (gastroesophageal reflux).  Certain medicines.  Chronic lung problems, including COPD (or rarely, lung cancer).  Other medical conditions such as heart failure. HOME CARE INSTRUCTIONS  Pay attention to any changes in your symptoms. Take these actions to help with your discomfort:  Take medicines only as told by your health care provider.  If you were prescribed an antibiotic medicine, take it as told by your health care provider. Do not stop taking the antibiotic even if you start to feel better.  Talk with your health care provider before you take a cough suppressant medicine.  Drink enough fluid to keep your urine clear or pale yellow.  If the air is dry, use a cold steam vaporizer or humidifier in your bedroom or your home to help loosen secretions.  Avoid anything that causes you to cough at work or at home.  If your cough is worse at night, try sleeping in a semi-upright position.  Avoid cigarette smoke. If you smoke, quit smoking. If you need help quitting, ask your health care provider.  Avoid caffeine.  Avoid alcohol.  Rest as needed. SEEK MEDICAL CARE IF:   You have new symptoms.  You cough up pus.  Your cough does not get better after 2-3 weeks, or your cough gets worse.  You cannot control your cough with suppressant medicines and you are losing sleep.  You  develop pain that is getting worse or pain that is not controlled with pain medicines.  You have a fever.  You have unexplained weight loss.  You have night sweats. SEEK IMMEDIATE MEDICAL CARE IF:  You cough up blood.  You have difficulty breathing.  Your heartbeat is very fast.   This information is not intended to replace advice given to you by your health care provider. Make sure you discuss any questions you have with your health care provider.   Document Released: 08/31/2010 Document Revised: 11/23/2014 Document Reviewed: 05/11/2014 Elsevier Interactive Patient Education 2016 [employer] Levofloxacin tablets What is this medicine? LEVOFLOXACIN (lee voe FLOX a sin) is a quinolone antibiotic. It is used to treat certain kinds of bacterial infections. It will not work for colds, flu, or other viral infections. This medicine may be used for other purposes; ask your health care provider or pharmacist if you have questions. What should I tell my health care provider before I take this medicine? They need to know if you have any of these conditions: -bone problems -cerebral disease -history of low levels of potassium in the blood -irregular heartbeat -joint problems -kidney disease -myasthenia gravis -seizures -tendon problems -tingling of the fingers or toes, or other nerve disorder -an unusual or allergic reaction to levofloxacin, other quinolone antibiotics, foods, dyes, or preservatives -pregnant or trying to get pregnant -breast-feeding How should I use this medicine? Take this medicine by mouth with a full glass of water. Follow the directions on  the prescription label. This medicine can be taken with or without food. Take your medicine at regular intervals. Do not take your medicine more often than directed. Do not skip doses or stop your medicine early even if you feel better. Do not stop taking except on your doctor's advice. A special MedGuide will be given to you  by the pharmacist with each prescription and refill. Be sure to read this information carefully each time. Talk to your pediatrician regarding the use of this medicine in children. While this drug may be prescribed for children as young as 6 months for selected conditions, precautions do apply. Overdosage: If you think you have taken too much of this medicine contact a poison control center or emergency room at once. NOTE: This medicine is only for you. Do not share this medicine with others. What if I miss a dose? If you miss a dose, take it as soon as you remember. If it is almost time for your next dose, take only that dose. Do not take double or extra doses. What may interact with this medicine? Do not take this medicine with any of the following medications: -arsenic trioxide -chloroquine -droperidol -medicines for irregular heart rhythm like amiodarone, disopyramide, dofetilide, flecainide, quinidine, procainamide, sotalol -some medicines for depression or mental problems like phenothiazines, pimozide, and ziprasidone This medicine may also interact with the following medications: -amoxapine -antacids -birth control pills -cisapride -dairy products -didanosine (ddI) buffered tablets or powder -haloperidol -multivitamins -NSAIDS, medicines for pain and inflammation, like ibuprofen or naproxen -retinoid products like tretinoin or isotretinoin -risperidone -some other antibiotics like clarithromycin or erythromycin -sucralfate -theophylline -warfarin This list may not describe all possible interactions. Give your health care provider a list of all the medicines, herbs, non-prescription drugs, or dietary supplements you use. Also tell them if you smoke, drink alcohol, or use illegal drugs. Some items may interact with your medicine. What should I watch for while using this medicine? Tell your doctor or health care professional if your symptoms do not improve or if they get worse.  Drink several glasses of water a day and cut down on drinks that contain caffeine. You must not get dehydrated while taking this medicine. You may get drowsy or dizzy. Do not drive, use machinery, or do anything that needs mental alertness until you know how this medicine affects you. Do not sit or stand up quickly, especially if you are an older patient. This reduces the risk of dizzy or fainting spells. This medicine can make you more sensitive to the sun. Keep out of the sun. If you cannot avoid being in the sun, wear protective clothing and use a sunscreen. Do not use sun lamps or tanning beds/booths. Contact your doctor if you get a sunburn. If you are a diabetic monitor your blood glucose carefully. If you get an unusual reading stop taking this medicine and call your doctor right away. Do not treat diarrhea with over-the-counter products. Contact your doctor if you have diarrhea that lasts more than 2 days or if the diarrhea is severe and watery. Avoid antacids, calcium, iron, and zinc products for 2 hours before and 2 hours after taking a dose of this medicine. What side effects may I notice from receiving this medicine? Side effects that you should report to your doctor or health care professional as soon as possible: -allergic reactions like skin rash or hives, swelling of the face, lips, or tongue -anxious -confusion -depressed mood -diarrhea -fast, irregular heartbeat -hallucination, loss of contact  with reality -joint, muscle, or tendon pain or swelling -pain, tingling, numbness in the hands or feet -suicidal thoughts or other mood changes -sunburn -unusually weak or tired Side effects that usually do not require medical attention (report to your doctor or health care professional if they continue or are bothersome): -dry mouth -headache -nausea -trouble sleeping This list may not describe all possible side effects. Call your doctor for medical advice about side effects. You may  report side effects to FDA at 1-800-FDA-1088. Where should I keep my medicine? Keep out of the reach of children. Store at room temperature between 15 and 30 degrees C (59 and 86 degrees F). Keep in a tightly closed container. Throw away any unused medicine after the expiration date. NOTE: This sheet is a summary. It may not cover all possible information. If you have questions about this medicine, talk to your doctor, pharmacist, or health care provider.    2016, Elsevier/Gold Standard. (2014-10-13 12:40:18)

## 2015-05-10 NOTE — Discharge Summary (Signed)
Physician Discharge Summary  [name]:[id number] DOB: 23-Jul-1929 DOA: 05/09/2015  PCP: Gweneth Dimitri, MD  Admit date: 05/09/2015 Discharge date: 05/10/2015  Recommendations for Outpatient Follow-up:  1. Check CBC and BMP during next visit with primary care physician 2. Patient will continue Levaquin for 7 days on discharge  Discharge Diagnoses:  Principal Problem:   Acute bronchitis with asthma Active Problems:   Essential hypertension   Hyperlipidemia   Acute bronchitis    Discharge Condition: stable   Diet recommendation: as tolerated   History of present illness:  Per admission note HPI "82 year old female with a history of hypertension, hypothyroidism presented to the ER with a one-week history of not feeling well. Initially it was a dry cough that started one week ago. Soon after that the patient started wheezing. She went to urgent care and was prescribed Z-Pak which did not help her symptoms. Now she has a rattle and upper airway wheeze which makes her a very short of breath when she walks.She denies any fever, chills, chest pain,. She has a history of multiple blood clots in both her legs. She was given Coumadin several years ago. Patient is being admitted after 2 ED visits and one urgent care visit for suspected acute viral bronchitis with bronchospasm."  Hospital Course:   Principal Problem:   Acute respiratory failure with hypoxia / acute COPD exacerbation - Patient reports she feels much better this morning. Stable respiratory status  - Patient will follow-up with primary care physician and will need pulmonary follow-up to address which inhaler she would benefit from. She is currently not taking any inhalers. - She will continue Levaquin for 7 days on discharge  - Pulmonary VQ scan did not show PE - Lower extremity Doppler did not show evidence of blood clots - No need to continue prednisone on discharge  Active Problems:   Essential hypertension -  Continue Norvasc and losartan on discharge     Hypothyroidism - Continue Synthroid  Signed:  Manson Passey, MD  [facility] 05/10/2015, 11:05 AM  Pager #: [phone]  Time spent in minutes: less than 30 minutes  Discharge Exam: Filed Vitals:   05/10/15 0124 05/10/15 0603  BP: 164/99 159/92  Pulse:  80  Temp:  97.7 F (36.5 C)  Resp: 22 20   Filed Vitals:   05/09/15 2211 05/09/15 2303 05/10/15 0124 05/10/15 0603  BP: 174/83 187/77 164/99 159/92  Pulse: 77   80  Temp: 97.8 F (36.6 C)   97.7 F (36.5 C)  TempSrc: Oral   Oral  Resp: 20  22 20   Height:      Weight:      SpO2: 96%  97% 98%    General: Pt is alert, follows commands appropriately, not in acute distress Cardiovascular: Regular rate and rhythm, S1/S2 +, no murmurs Respiratory: some rhonchi in upper lung lobes, no wheezing   Abdominal: Soft, non tender, non distended, bowel sounds +, no guarding Extremities: no edema, no cyanosis, pulses palpable bilaterally DP and PT Neuro: Grossly nonfocal  Discharge Instructions  Discharge Instructions    Call MD for:  difficulty breathing, headache or visual disturbances    Complete by:  As directed      Call MD for:  persistant dizziness or light-headedness    Complete by:  As directed      Call MD for:  persistant nausea and vomiting    Complete by:  As directed      Call MD for:  severe uncontrolled pain  Complete by:  As directed      Diet - low sodium heart healthy    Complete by:  As directed      Discharge instructions    Complete by:  As directed   1. Continue Levaquin for 7 days on discharge     Increase activity slowly    Complete by:  As directed             Medication List    STOP taking these medications        predniSONE 50 MG tablet  Commonly known as:  DELTASONE      TAKE these medications        amLODipine 5 MG tablet  Commonly known as:  NORVASC  Take 5 mg by mouth daily.     levofloxacin 750 MG tablet  Commonly  known as:  LEVAQUIN  Take 1 tablet (750 mg total) by mouth daily.     levothyroxine 125 MCG tablet  Commonly known as:  SYNTHROID, LEVOTHROID  Take 62.5-125 mcg by mouth daily. Takes everyday except on Sunday's takes 43.     losartan 100 MG tablet  Commonly known as:  COZAAR  Take 100 mg by mouth daily.     Vitamin D 2000 units tablet  Take 4,000 Units by mouth daily.           Follow-up Information    Follow up with MCNEILL,WENDY, MD. Schedule an appointment as soon as possible for a visit in 1 week.   Specialty:  Family Medicine   Why:  Follow up appt after recent hospitalization   Contact information:   [address] [place] [address] [phone]        The results of significant diagnostics from this hospitalization (including imaging, microbiology, ancillary and laboratory) are listed below for reference.    Significant Diagnostic Studies: Dg Chest 2 View  05/09/2015  CLINICAL DATA:  Worsening cough, congestion and shortness of breath over the last 5 days EXAM: CHEST  2 VIEW COMPARISON:  05/07/2015 FINDINGS: There is hyperinflation of the lungs compatible with COPD. Heart and mediastinal contours are within normal limits. No focal opacities or effusions. No acute bony abnormality. IMPRESSION: COPD.  No active disease. Electronically Signed   By: Charlett Nose M.D.   On: 05/09/2015 08:00   Dg Chest 2 View  05/07/2015  CLINICAL DATA:  Pt c/o cough and difficulty breathing x3days. No there current chest complaints. Non smoker, HTN EXAM: CHEST  2 VIEW COMPARISON:  12/12/2010 FINDINGS: The heart size and mediastinal contours are within normal limits. Both lungs are clear. No pleural effusion or pneumothorax. Bony thorax is demineralized but intact. IMPRESSION: No acute cardiopulmonary disease. Electronically Signed   By: Amie Portland M.D.   On: 05/07/2015 08:32   Nm Pulmonary Perf And Vent  05/09/2015  CLINICAL DATA:  82 year old female with acute  shortness of Breath. Initial encounter. EXAM: NUCLEAR MEDICINE VENTILATION - PERFUSION LUNG SCAN TECHNIQUE: Ventilation images were obtained in multiple projections using inhaled aerosol Tc-28m DTPA. Perfusion images were obtained in multiple projections after intravenous injection of Tc-42m MAA. RADIOPHARMACEUTICALS:  32.1 millicuries Technetium-47m DTPA aerosol inhalation and 4.1 millicuries Technetium-72m MAA IV COMPARISON:  Chest radiographs 0750 hours today. FINDINGS: Ventilation: Poor radiotracer distribution with clumping about the central airways. Perfusion: No perfusion defect identified. IMPRESSION: Limited ventilation imaging, but no perfusion defect to suggest acute pulmonary embolus. Electronically Signed   By: Odessa Fleming M.D.   On: 05/09/2015 15:37  Microbiology: No results found for this or any previous visit (from the past 240 hour(s)).   Labs: Basic Metabolic Panel:  Recent Labs Lab 05/07/15 0840 05/09/15 0748 05/09/15 0749 05/10/15 0413  NA 139  --  140 142  K 3.9  --  3.5 4.0  CL 103  --  104 107  CO2 25  --  24 24  GLUCOSE 133*  --  124* 157*  BUN 19  --  26* 30*  CREATININE 0.91  --  0.90 0.94  CALCIUM 9.4  --  9.1 8.9  MG  --  1.9  --   --    Liver Function Tests:  Recent Labs Lab 05/09/15 0749 05/10/15 0413  AST 54* 54*  ALT 27 30  ALKPHOS 72 60  BILITOT 0.6 0.6  PROT 8.2* 7.4  ALBUMIN 4.2 3.7   No results for input(s): LIPASE, AMYLASE in the last 168 hours. No results for input(s): AMMONIA in the last 168 hours. CBC:  Recent Labs Lab 05/07/15 0840 05/09/15 0749 05/10/15 0413  WBC 7.3 8.6 6.7  NEUTROABS 5.6  --   --   HGB 14.2 13.8 13.6  HCT 43.8 45.5 44.6  MCV 92.2 93.6 94.1  PLT 205 220 216   Cardiac Enzymes:  Recent Labs Lab 05/09/15 1214 05/09/15 1805 05/09/15 2306  TROPONINI <0.03 <0.03 <0.03   BNP: BNP (last 3 results) No results for input(s): BNP in the last 8760 hours.  ProBNP (last 3 results) No results for input(s):  PROBNP in the last 8760 hours.  CBG: No results for input(s): GLUCAP in the last 168 hours.

## 2015-12-21 ENCOUNTER — Emergency Department (HOSPITAL_COMMUNITY)
Admission: EM | Admit: 2015-12-21 | Discharge: 2015-12-21 | Disposition: A | Discharge status: Home / Self Care | Payer: Medicare Other | Attending: Emergency Medicine | Admitting: Emergency Medicine

## 2015-12-21 ENCOUNTER — Emergency Department (HOSPITAL_COMMUNITY): Payer: Medicare Other

## 2015-12-21 ENCOUNTER — Encounter (HOSPITAL_COMMUNITY): Payer: Self-pay | Admitting: Emergency Medicine

## 2015-12-21 DIAGNOSIS — Y999 Unspecified external cause status: Secondary | ICD-10-CM | POA: Diagnosis not present

## 2015-12-21 DIAGNOSIS — E039 Hypothyroidism, unspecified: Secondary | ICD-10-CM | POA: Insufficient documentation

## 2015-12-21 DIAGNOSIS — Y939 Activity, unspecified: Secondary | ICD-10-CM | POA: Insufficient documentation

## 2015-12-21 DIAGNOSIS — I10 Essential (primary) hypertension: Secondary | ICD-10-CM | POA: Insufficient documentation

## 2015-12-21 DIAGNOSIS — Z79899 Other long term (current) drug therapy: Secondary | ICD-10-CM | POA: Insufficient documentation

## 2015-12-21 DIAGNOSIS — S8992XA Unspecified injury of left lower leg, initial encounter: Secondary | ICD-10-CM | POA: Diagnosis present

## 2015-12-21 DIAGNOSIS — S8002XA Contusion of left knee, initial encounter: Secondary | ICD-10-CM | POA: Diagnosis not present

## 2015-12-21 DIAGNOSIS — Y92009 Unspecified place in unspecified non-institutional (private) residence as the place of occurrence of the external cause: Secondary | ICD-10-CM | POA: Diagnosis not present

## 2015-12-21 DIAGNOSIS — W19XXXA Unspecified fall, initial encounter: Secondary | ICD-10-CM | POA: Diagnosis not present

## 2015-12-21 HISTORY — DX: Acute embolism and thrombosis of unspecified deep veins of unspecified lower extremity: I82.409

## 2015-12-21 MED ORDER — IBUPROFEN 200 MG PO TABS
200.0000 mg | ORAL_TABLET | Freq: Once | ORAL | Status: DC
  Filled 2015-12-21: qty 1

## 2015-12-21 NOTE — ED Provider Notes (Signed)
[facility]-EMERGENCY DEPT Provider Note   CSN: [id number] Arrival date & time: 12/21/15  4098     History   Chief Complaint Chief Complaint  Patient presents with  . Leg Injury  . Leg Pain    HPI [name] is a 82 y.o. female.  Patient c/o fall and injury to left knee 1 week ago. C/o pain and swelling to left knee. Pain constant, persistent, moderate, worse w walking/movement. No associated numbness/weakness. Skin intact. Denies other pain or injury.    The history is provided by the patient.  Leg Pain   Pertinent negatives include no numbness.    Past Medical History:  Diagnosis Date  . DVT, lower extremity (HCC)   . Hemorrhoids   . Hypertension   . Thyroid disease     Patient Active Problem List   Diagnosis Date Noted  . Acute bronchitis with asthma 05/09/2015  . Acute bronchitis 05/09/2015  . Essential hypertension 06/23/2014  . Hyperlipidemia 06/23/2014  . Bilateral lower extremity edema 06/23/2014  . HYPOTHYROIDISM, POST-RADIATION 10/21/2007  . OSTEOARTHRITIS 07/20/2007  . DEEP VENOUS THROMBOPHLEBITIS, HX OF 07/20/2007    Past Surgical History:  Procedure Laterality Date  . ABDOMINAL HYSTERECTOMY    . APPENDECTOMY    . CHOLECYSTECTOMY    . JOINT REPLACEMENT     left knee  . right knee replacement    . TUBAL LIGATION      OB History    No data available       Home Medications    Prior to Admission medications   Medication Sig Start Date End Date Taking? Authorizing Provider  amLODipine (NORVASC) 5 MG tablet Take 5 mg by mouth daily.      Historical Provider, MD  Cholecalciferol (VITAMIN D) 2000 units tablet Take 4,000 Units by mouth daily.    Historical Provider, MD  levofloxacin (LEVAQUIN) 750 MG tablet Take 1 tablet (750 mg total) by mouth daily. 05/10/15   Alison Murray, MD  levothyroxine (SYNTHROID, LEVOTHROID) 125 MCG tablet Take 62.5-125 mcg by mouth daily. Takes everyday except on Sunday's takes 70.    Historical Provider,  MD  losartan (COZAAR) 100 MG tablet Take 100 mg by mouth daily.      Historical Provider, MD    Family History Family History  Problem Relation Age of Onset  . Cancer Father     Social History Social History  Substance Use Topics  . Smoking status: Never Smoker  . Smokeless tobacco: Never Used  . Alcohol use No     Allergies   Contrast media [iodinated diagnostic agents]; Penicillins; and Codeine   Review of Systems Review of Systems  Constitutional: Negative for fever.  Respiratory: Negative for shortness of breath.   Cardiovascular: Negative for chest pain.  Musculoskeletal: Negative for back pain and neck pain.  Skin: Negative for wound.  Neurological: Negative for weakness, numbness and headaches.  Hematological: Does not bruise/bleed easily.     Physical Exam Updated Vital Signs BP 167/85 (BP Location: Right Arm)   Pulse 77   Temp 98.7 F (37.1 C) (Oral)   Resp 18   Ht 5\' 6"  (1.676 m)   Wt 99.3 kg   SpO2 99%   BMI 35.35 kg/m   Physical Exam  Constitutional: She appears well-developed and well-nourished. No distress.  Eyes: Conjunctivae are normal. No scleral icterus.  Neck: Neck supple. No tracheal deviation present.  Cardiovascular: Normal rate, regular rhythm, normal heart sounds and intact distal pulses.   Pulmonary/Chest:  Effort normal and breath sounds normal. No respiratory distress.  Abdominal: Normal appearance.  Musculoskeletal: She exhibits no edema.  Ecchymosis, tenderness, and sts, anterior to left knee. Left knee grossly stable. Distal pulses palp. No lower leg or foot swelling. No calf pain, swelling or tenderness.   Neurological: She is alert.  LLE nvi, motor 5/5, sens intact.   Skin: Skin is warm and dry. No rash noted. She is not diaphoretic.  Psychiatric: She has a normal mood and affect.  Nursing note and vitals reviewed.    ED Treatments / Results  Labs (all labs ordered are listed, but only abnormal results are  displayed) Labs Reviewed - No data to display  EKG  EKG Interpretation None       Radiology Dg Knee Complete 4 Views Left  Result Date: 12/21/2015 CLINICAL DATA:  Fall several days ago with persistent knee pain, initial encounter EXAM: LEFT KNEE - COMPLETE 4+ VIEW COMPARISON:  None. FINDINGS: Left knee prosthesis is seen. No acute fracture or dislocation is noted. No joint effusion is seen. No gross soft tissue abnormality is noted. IMPRESSION: Postoperative changes without acute abnormality. Electronically Signed   By: Alcide CleverMark  Lukens M.D.   On: 12/21/2015 07:44    Procedures Procedures (including critical care time)  Medications Ordered in ED Medications - No data to display   Initial Impression / Assessment and Plan / ED Course  I have reviewed the triage vital signs and the nursing notes.  Pertinent labs & imaging results that were available during my care of the patient were reviewed by me and considered in my medical decision making (see chart for details).  Clinical Course    HIstory and exam c/w contusion to left knee.  No symptoms or exam findings to suggest dvt.   Xrays.   Reviewed nursing notes and prior charts for additional history.     Final Clinical Impressions(s) / ED Diagnoses   Final diagnoses:  None    New Prescriptions New Prescriptions   No medications on file     Cathren LaineKevin Claudia Greenley, MD 12/21/15 [phone]

## 2015-12-21 NOTE — Discharge Instructions (Signed)
It was our pleasure to provide your ER care today - we hope that you feel better.  Ice/coldpack to sore area.  Take motrin or aleve as need for pain.   Follow up with primary care doctor in the next 1-2 weeks if symptoms fail to improve/resolve.  Return to ER if worse, increased/progressive leg swelling, other concern.

## 2015-12-21 NOTE — ED Triage Notes (Signed)
Pt states she fell at home on Friday  Pt has bruising noted to her left knee  Pt states she rested all weekend and on Monday but the pain in her knee has been getting worse  Pt states she has a hx of DVT and she went to a walk in clinic yesterday and was seen by Dr Hyacinth MeekerMiller and he recommended she come here this morning for further evaluation of her leg  Pt states she has no feeling to the outer part of her calf   Pt has swelling noted to her lower leg  Pt states she is not currently on any blood thinners

## 2015-12-27 ENCOUNTER — Ambulatory Visit (INDEPENDENT_AMBULATORY_CARE_PROVIDER_SITE_OTHER): Payer: Medicare Other | Admitting: Podiatry

## 2015-12-27 ENCOUNTER — Encounter: Payer: Self-pay | Admitting: Podiatry

## 2015-12-27 VITALS — BP 153/81 | HR 61 | Resp 16

## 2015-12-27 DIAGNOSIS — B351 Tinea unguium: Secondary | ICD-10-CM

## 2015-12-27 DIAGNOSIS — L608 Other nail disorders: Secondary | ICD-10-CM

## 2015-12-27 DIAGNOSIS — M79676 Pain in unspecified toe(s): Secondary | ICD-10-CM | POA: Diagnosis not present

## 2015-12-27 DIAGNOSIS — L851 Acquired keratosis [keratoderma] palmaris et plantaris: Secondary | ICD-10-CM

## 2015-12-27 DIAGNOSIS — M79609 Pain in unspecified limb: Principal | ICD-10-CM

## 2015-12-27 DIAGNOSIS — M79671 Pain in right foot: Secondary | ICD-10-CM

## 2015-12-27 DIAGNOSIS — L603 Nail dystrophy: Secondary | ICD-10-CM

## 2015-12-27 DIAGNOSIS — M79672 Pain in left foot: Secondary | ICD-10-CM

## 2015-12-27 DIAGNOSIS — L84 Corns and callosities: Secondary | ICD-10-CM

## 2015-12-28 ENCOUNTER — Other Ambulatory Visit (HOSPITAL_COMMUNITY): Payer: Self-pay | Admitting: Family Medicine

## 2015-12-28 DIAGNOSIS — M7989 Other specified soft tissue disorders: Principal | ICD-10-CM

## 2015-12-28 DIAGNOSIS — M79605 Pain in left leg: Secondary | ICD-10-CM

## 2015-12-29 ENCOUNTER — Ambulatory Visit (HOSPITAL_COMMUNITY)
Admission: RE | Admit: 2015-12-29 | Discharge: 2015-12-29 | Disposition: A | Discharge status: Home / Self Care | Payer: Medicare Other | Source: Ambulatory Visit | Attending: Family Medicine | Admitting: Family Medicine

## 2015-12-29 DIAGNOSIS — S8002XD Contusion of left knee, subsequent encounter: Secondary | ICD-10-CM | POA: Insufficient documentation

## 2015-12-29 DIAGNOSIS — M7989 Other specified soft tissue disorders: Secondary | ICD-10-CM | POA: Diagnosis not present

## 2015-12-29 DIAGNOSIS — M79605 Pain in left leg: Secondary | ICD-10-CM

## 2015-12-29 DIAGNOSIS — X58XXXD Exposure to other specified factors, subsequent encounter: Secondary | ICD-10-CM | POA: Insufficient documentation

## 2015-12-29 DIAGNOSIS — I8392 Asymptomatic varicose veins of left lower extremity: Secondary | ICD-10-CM | POA: Diagnosis not present

## 2015-12-29 NOTE — Progress Notes (Signed)
*  PRELIMINARY RESULTS* Vascular Ultrasound Left lower extremity venous duplex has been completed.  Preliminary findings: No evidence of DVT or baker's cyst.   [facility]   Farrel DemarkJill Eunice, RDMS, RVT  12/29/2015, 9:16 AM

## 2016-01-01 NOTE — Progress Notes (Signed)
Patient ID: [name], female   DOB: 20-May-1929, 82 y.o.   MRN: [id number]  Subjective: 82 y.o.-year-old female returns the office today for painful, elongated, thickened toenails and calluses. Denies any redness or drainage around the nails/calluses. Denies any acute changes since last appointment and no new complaints today. Denies any systemic complaints such as fevers, chills, nausea, vomiting.   Objective: AAO 3, NAD DP/PT pulses palpable, CRT less than 3 seconds Protective sensation intact with Simms Weinstein monofilament, Achilles tendon reflex intact.  Nails hypertrophic, dystrophic, elongated, brittle, discolored 9. There is tenderness overlying these nails. There is no surrounding erythema or drainage along the nail sites. 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  Hyperkerotic lesions at the distal aspect of digits 3/4 on the left. Upon debridement, no underlying ulceration, drainage, or other signs of infection.  No open lesions or pre-ulcerative lesions are identified. No other areas of tenderness bilateral lower extremities. No overlying edema, erythema, increased warmth. No pain with calf compression, swelling, warmth, erythema.  Assessment: Patient presents with symptomatic onychomycosis  Plan: -Treatment options including alternatives, risks, complications were discussed -Nails sharply debrided 9 without complication/bleeding. -Hyperkerotic lesions sharply debrided x 2 without complications/bleeding.  -Discussed daily foot inspection. If there are any changes, to call the office immediately.  -Follow-up in 3 months or sooner if any problems are to arise. In the meantime, encouraged to call the office with any questions, concerns, changes symptoms.

## 2016-02-28 ENCOUNTER — Ambulatory Visit (INDEPENDENT_AMBULATORY_CARE_PROVIDER_SITE_OTHER): Payer: Medicare Other | Admitting: Podiatry

## 2016-02-28 DIAGNOSIS — L603 Nail dystrophy: Secondary | ICD-10-CM

## 2016-02-28 DIAGNOSIS — M79609 Pain in unspecified limb: Secondary | ICD-10-CM | POA: Diagnosis not present

## 2016-02-28 DIAGNOSIS — B351 Tinea unguium: Secondary | ICD-10-CM | POA: Diagnosis not present

## 2016-02-28 DIAGNOSIS — L608 Other nail disorders: Secondary | ICD-10-CM

## 2016-02-28 NOTE — Progress Notes (Signed)
SUBJECTIVE Patient  presents to office today complaining of elongated, thickened nails. Pain while ambulating in shoes. Patient is unable to trim their own nails.   OBJECTIVE General Patient is awake, alert, and oriented x 3 and in no acute distress. Derm Skin is dry and supple bilateral. Negative open lesions or macerations. Remaining integument unremarkable. Nails are tender, long, thickened and dystrophic with subungual debris, consistent with onychomycosis, 1-5 bilateral. No signs of infection noted. Vasc  DP and PT pedal pulses palpable bilaterally. Temperature gradient within normal limits.  Neuro Epicritic and protective threshold sensation diminished bilaterally.  Musculoskeletal Exam No symptomatic pedal deformities noted bilateral. Muscular strength within normal limits.  ASSESSMENT 1. Onychodystrophic nails 1-5 bilateral with hyperkeratosis of nails.  2. Onychomycosis of nail due to dermatophyte bilateral 3. Pain in foot bilateral  PLAN OF CARE 1. Patient evaluated today.  2. Instructed to maintain good pedal hygiene and foot care.  3. Mechanical debridement of nails 1-5 bilaterally performed using a nail nipper. Filed with dremel without incident.  4. Return to clinic in 3 mos.    Cledith Abdou M Linc Renne, DPM    

## 2016-05-01 ENCOUNTER — Ambulatory Visit (INDEPENDENT_AMBULATORY_CARE_PROVIDER_SITE_OTHER): Payer: Medicare Other | Admitting: Podiatry

## 2016-05-01 ENCOUNTER — Encounter: Payer: Self-pay | Admitting: Podiatry

## 2016-05-01 VITALS — BP 150/81 | HR 66 | Resp 18

## 2016-05-01 DIAGNOSIS — M79609 Pain in unspecified limb: Secondary | ICD-10-CM

## 2016-05-01 DIAGNOSIS — B351 Tinea unguium: Secondary | ICD-10-CM

## 2016-05-01 DIAGNOSIS — L84 Corns and callosities: Secondary | ICD-10-CM

## 2016-05-01 NOTE — Progress Notes (Signed)
   Subjective:    Patient ID: [name], female    DOB: [birth year], 83 y.o.   MRN: [id number]  HPI   I am here to get my toenails trimmed up     Review of Systems  All other systems reviewed and are negative.      Objective:   Physical Exam        Assessment & Plan:

## 2016-05-01 NOTE — Patient Instructions (Signed)
Removed Band-Aid on the second right toe in 1-3 days and apply topical antibiotic ointment and a Band-Aid until a scab forms

## 2016-05-01 NOTE — Progress Notes (Signed)
Patient ID: Helene ShoeConstance J Crickenberger, female   DOB: 12/23/1929, 83 y.o.   MRN: [id number]   Subjective: Patient presents for a scheduled visit at 61 days complaining of uncomfortable toenails walking wearing shoes and multiple keratoses on the right and left feet  Objective: Orientated 3 Mild peripheral pitting edema bilaterally DP 1/4 bilaterally PT pulses 2/4 bilaterally Capillary reflex immediate bilaterally Sensation to 10 g monofilament wire intact 4/5 bilaterally Vibratory sensation nonreactive bilaterally Ankle reflexes reactive bilaterally Atrophic skin bilaterally Toenails 9 are brittle, elongated, discolored, deformed and tender to direct palpation Keratoses plantar right hallux, second right toe distal third toe Amputated second left toe Hammertoe second right and fourth left Fourth left toe edematous with what appears to be tophaceous gouty debris without any open lesions Patient walks slowly with cane  Assessment: Symptomatic onychomycoses 9 Digital plantar keratoses right and left Peripheral neuropathy  Plan: Debridement toenails 9 mechanically an elective without a bleeding Debride digital plantar keratoses with slight bleeding on the second right toe treated with antibiotic ointment and Band-Aid. Patient instructed removed Band-Aid 1-3 days and continue to apply topical antibiotic ointment and Band-Aid until a scab forms  Reappoint 61 days

## 2016-07-03 ENCOUNTER — Ambulatory Visit: Payer: Medicare Other | Admitting: Podiatry

## 2020-10-07 ENCOUNTER — Inpatient Hospital Stay (HOSPITAL_COMMUNITY)
Admission: EM | Admit: 2020-10-07 | Discharge: 2020-10-12 | DRG: 089 | Disposition: A | Discharge status: Skilled Nursing Facility | Payer: Medicare Other | Attending: Internal Medicine | Admitting: Internal Medicine

## 2020-10-07 ENCOUNTER — Emergency Department (HOSPITAL_COMMUNITY): Payer: Medicare Other

## 2020-10-07 ENCOUNTER — Encounter (HOSPITAL_COMMUNITY): Payer: Self-pay | Admitting: *Deleted

## 2020-10-07 ENCOUNTER — Other Ambulatory Visit: Payer: Self-pay

## 2020-10-07 DIAGNOSIS — S060X0A Concussion without loss of consciousness, initial encounter: Secondary | ICD-10-CM

## 2020-10-07 DIAGNOSIS — N183 Chronic kidney disease, stage 3 unspecified: Secondary | ICD-10-CM | POA: Diagnosis present

## 2020-10-07 DIAGNOSIS — W19XXXA Unspecified fall, initial encounter: Secondary | ICD-10-CM | POA: Diagnosis present

## 2020-10-07 DIAGNOSIS — I160 Hypertensive urgency: Secondary | ICD-10-CM | POA: Diagnosis present

## 2020-10-07 DIAGNOSIS — F0390 Unspecified dementia without behavioral disturbance: Secondary | ICD-10-CM | POA: Diagnosis present

## 2020-10-07 DIAGNOSIS — S5001XA Contusion of right elbow, initial encounter: Secondary | ICD-10-CM

## 2020-10-07 DIAGNOSIS — R112 Nausea with vomiting, unspecified: Secondary | ICD-10-CM

## 2020-10-07 DIAGNOSIS — S0093XA Contusion of unspecified part of head, initial encounter: Secondary | ICD-10-CM | POA: Diagnosis present

## 2020-10-07 DIAGNOSIS — E039 Hypothyroidism, unspecified: Secondary | ICD-10-CM | POA: Diagnosis present

## 2020-10-07 DIAGNOSIS — R9431 Abnormal electrocardiogram [ECG] [EKG]: Secondary | ICD-10-CM | POA: Diagnosis present

## 2020-10-07 DIAGNOSIS — R109 Unspecified abdominal pain: Secondary | ICD-10-CM

## 2020-10-07 DIAGNOSIS — I4581 Long QT syndrome: Secondary | ICD-10-CM | POA: Diagnosis present

## 2020-10-07 DIAGNOSIS — Y92014 Private driveway to single-family (private) house as the place of occurrence of the external cause: Secondary | ICD-10-CM

## 2020-10-07 DIAGNOSIS — I129 Hypertensive chronic kidney disease with stage 1 through stage 4 chronic kidney disease, or unspecified chronic kidney disease: Secondary | ICD-10-CM | POA: Diagnosis present

## 2020-10-07 DIAGNOSIS — Z86718 Personal history of other venous thrombosis and embolism: Secondary | ICD-10-CM

## 2020-10-07 DIAGNOSIS — Z90710 Acquired absence of both cervix and uterus: Secondary | ICD-10-CM

## 2020-10-07 DIAGNOSIS — R441 Visual hallucinations: Secondary | ICD-10-CM | POA: Diagnosis present

## 2020-10-07 DIAGNOSIS — I447 Left bundle-branch block, unspecified: Secondary | ICD-10-CM | POA: Diagnosis present

## 2020-10-07 DIAGNOSIS — N1831 Chronic kidney disease, stage 3a: Secondary | ICD-10-CM

## 2020-10-07 DIAGNOSIS — S0083XA Contusion of other part of head, initial encounter: Secondary | ICD-10-CM | POA: Diagnosis not present

## 2020-10-07 DIAGNOSIS — Y939 Activity, unspecified: Secondary | ICD-10-CM

## 2020-10-07 DIAGNOSIS — S0003XA Contusion of scalp, initial encounter: Secondary | ICD-10-CM | POA: Diagnosis not present

## 2020-10-07 DIAGNOSIS — R32 Unspecified urinary incontinence: Secondary | ICD-10-CM | POA: Diagnosis present

## 2020-10-07 DIAGNOSIS — E860 Dehydration: Secondary | ICD-10-CM | POA: Diagnosis present

## 2020-10-07 DIAGNOSIS — Z79899 Other long term (current) drug therapy: Secondary | ICD-10-CM

## 2020-10-07 DIAGNOSIS — Z882 Allergy status to sulfonamides status: Secondary | ICD-10-CM

## 2020-10-07 DIAGNOSIS — Z888 Allergy status to other drugs, medicaments and biological substances status: Secondary | ICD-10-CM

## 2020-10-07 DIAGNOSIS — W1839XA Other fall on same level, initial encounter: Secondary | ICD-10-CM | POA: Diagnosis present

## 2020-10-07 DIAGNOSIS — D72828 Other elevated white blood cell count: Secondary | ICD-10-CM | POA: Diagnosis present

## 2020-10-07 DIAGNOSIS — Z781 Physical restraint status: Secondary | ICD-10-CM

## 2020-10-07 DIAGNOSIS — R778 Other specified abnormalities of plasma proteins: Secondary | ICD-10-CM

## 2020-10-07 DIAGNOSIS — Z880 Allergy status to penicillin: Secondary | ICD-10-CM

## 2020-10-07 DIAGNOSIS — F0280 Dementia in other diseases classified elsewhere without behavioral disturbance: Secondary | ICD-10-CM

## 2020-10-07 DIAGNOSIS — Z20822 Contact with and (suspected) exposure to covid-19: Secondary | ICD-10-CM | POA: Diagnosis present

## 2020-10-07 DIAGNOSIS — S50311A Abrasion of right elbow, initial encounter: Secondary | ICD-10-CM | POA: Diagnosis present

## 2020-10-07 DIAGNOSIS — R600 Localized edema: Secondary | ICD-10-CM | POA: Diagnosis present

## 2020-10-07 DIAGNOSIS — Z79890 Hormone replacement therapy (postmenopausal): Secondary | ICD-10-CM

## 2020-10-07 DIAGNOSIS — Z96652 Presence of left artificial knee joint: Secondary | ICD-10-CM | POA: Diagnosis present

## 2020-10-07 DIAGNOSIS — N179 Acute kidney failure, unspecified: Secondary | ICD-10-CM | POA: Diagnosis present

## 2020-10-07 DIAGNOSIS — F0391 Unspecified dementia with behavioral disturbance: Secondary | ICD-10-CM | POA: Diagnosis present

## 2020-10-07 DIAGNOSIS — G309 Alzheimer's disease, unspecified: Secondary | ICD-10-CM | POA: Diagnosis not present

## 2020-10-07 DIAGNOSIS — E785 Hyperlipidemia, unspecified: Secondary | ICD-10-CM | POA: Diagnosis present

## 2020-10-07 HISTORY — DX: Hypothyroidism, unspecified: E03.9

## 2020-10-07 HISTORY — DX: Essential (primary) hypertension: I10

## 2020-10-07 HISTORY — DX: Unspecified dementia, unspecified severity, without behavioral disturbance, psychotic disturbance, mood disturbance, and anxiety: F03.90

## 2020-10-07 HISTORY — DX: Unspecified dementia without behavioral disturbance: F03.90

## 2020-10-07 LAB — I-STAT CHEM 8, ED
BUN: 41 mg/dL — ABNORMAL HIGH (ref 8–23)
Calcium, Ion: 1.09 mmol/L — ABNORMAL LOW (ref 1.15–1.40)
Chloride: 104 mmol/L (ref 98–111)
Creatinine, Ser: 1.1 mg/dL — ABNORMAL HIGH (ref 0.44–1.00)
Glucose, Bld: 100 mg/dL — ABNORMAL HIGH (ref 70–99)
HCT: 47 % — ABNORMAL HIGH (ref 36.0–46.0)
Hemoglobin: 16 g/dL — ABNORMAL HIGH (ref 12.0–15.0)
Potassium: 4.6 mmol/L (ref 3.5–5.1)
Sodium: 141 mmol/L (ref 135–145)
TCO2: 28 mmol/L (ref 22–32)

## 2020-10-07 LAB — CBC WITH DIFFERENTIAL/PLATELET
Abs Immature Granulocytes: 0.05 10*3/uL (ref 0.00–0.07)
Basophils Absolute: 0 10*3/uL (ref 0.0–0.1)
Basophils Relative: 0 %
Eosinophils Absolute: 0.1 10*3/uL (ref 0.0–0.5)
Eosinophils Relative: 2 %
HCT: 46.4 % — ABNORMAL HIGH (ref 36.0–46.0)
Hemoglobin: 14.7 g/dL (ref 12.0–15.0)
Immature Granulocytes: 1 %
Lymphocytes Relative: 24 %
Lymphs Abs: 1.7 10*3/uL (ref 0.7–4.0)
MCH: 29.5 pg (ref 26.0–34.0)
MCHC: 31.7 g/dL (ref 30.0–36.0)
MCV: 93 fL (ref 80.0–100.0)
Monocytes Absolute: 0.6 10*3/uL (ref 0.1–1.0)
Monocytes Relative: 9 %
Neutro Abs: 4.5 10*3/uL (ref 1.7–7.7)
Neutrophils Relative %: 64 %
Platelets: 207 10*3/uL (ref 150–400)
RBC: 4.99 MIL/uL (ref 3.87–5.11)
RDW: 13.8 % (ref 11.5–15.5)
WBC: 7.1 10*3/uL (ref 4.0–10.5)
nRBC: 0 % (ref 0.0–0.2)

## 2020-10-07 LAB — URINALYSIS, ROUTINE W REFLEX MICROSCOPIC
Bacteria, UA: NONE SEEN
Bilirubin Urine: NEGATIVE
Glucose, UA: NEGATIVE mg/dL
Ketones, ur: NEGATIVE mg/dL
Leukocytes,Ua: NEGATIVE
Nitrite: NEGATIVE
Protein, ur: 100 mg/dL — AB
Specific Gravity, Urine: 1.008 (ref 1.005–1.030)
pH: 7 (ref 5.0–8.0)

## 2020-10-07 LAB — COMPREHENSIVE METABOLIC PANEL
ALT: 26 U/L (ref 0–44)
AST: 37 U/L (ref 15–41)
Albumin: 4 g/dL (ref 3.5–5.0)
Alkaline Phosphatase: 62 U/L (ref 38–126)
Anion gap: 9 (ref 5–15)
BUN: 30 mg/dL — ABNORMAL HIGH (ref 8–23)
CO2: 26 mmol/L (ref 22–32)
Calcium: 9.7 mg/dL (ref 8.9–10.3)
Chloride: 104 mmol/L (ref 98–111)
Creatinine, Ser: 1.06 mg/dL — ABNORMAL HIGH (ref 0.44–1.00)
GFR, Estimated: 50 mL/min — ABNORMAL LOW (ref >60)
Glucose, Bld: 100 mg/dL — ABNORMAL HIGH (ref 70–99)
Potassium: 4.5 mmol/L (ref 3.5–5.1)
Sodium: 139 mmol/L (ref 135–145)
Total Bilirubin: 0.8 mg/dL (ref 0.3–1.2)
Total Protein: 7.6 g/dL (ref 6.5–8.1)

## 2020-10-07 LAB — RESP PANEL BY RT-PCR (FLU A&B, COVID) ARPGX2
Influenza A by PCR: NEGATIVE
Influenza B by PCR: NEGATIVE
SARS Coronavirus 2 by RT PCR: NEGATIVE

## 2020-10-07 LAB — PROTIME-INR
INR: 1 (ref 0.8–1.2)
Prothrombin Time: 13.7 seconds (ref 11.4–15.2)

## 2020-10-07 LAB — TROPONIN I (HIGH SENSITIVITY)
Troponin I (High Sensitivity): 13 ng/L (ref <18)
Troponin I (High Sensitivity): 26 ng/L — ABNORMAL HIGH (ref <18)
Troponin I (High Sensitivity): 79 ng/L — ABNORMAL HIGH (ref <18)

## 2020-10-07 MED ORDER — HYDRALAZINE HCL 20 MG/ML IJ SOLN
20.0000 mg | Freq: Once | INTRAMUSCULAR | Status: AC
  Administered 2020-10-07: 20 mg via INTRAVENOUS
  Filled 2020-10-07: qty 1

## 2020-10-07 MED ORDER — LOSARTAN POTASSIUM 50 MG PO TABS
100.0000 mg | ORAL_TABLET | Freq: Every day | ORAL | Status: DC
  Administered 2020-10-07 – 2020-10-09 (×2): 100 mg via ORAL
  Filled 2020-10-07 (×3): qty 2

## 2020-10-07 MED ORDER — FENTANYL CITRATE (PF) 100 MCG/2ML IJ SOLN
25.0000 ug | Freq: Once | INTRAMUSCULAR | Status: AC
Start: 2020-10-07 — End: 2020-10-07
  Administered 2020-10-07: 25 ug via INTRAVENOUS
  Filled 2020-10-07: qty 2

## 2020-10-07 MED ORDER — HYDRALAZINE HCL 20 MG/ML IJ SOLN
10.0000 mg | INTRAMUSCULAR | Status: DC | PRN
  Administered 2020-10-07 – 2020-10-11 (×3): 10 mg via INTRAVENOUS
  Filled 2020-10-07 (×4): qty 1

## 2020-10-07 MED ORDER — ACETAMINOPHEN 500 MG PO TABS
1000.0000 mg | ORAL_TABLET | Freq: Once | ORAL | Status: AC
  Administered 2020-10-07: 1000 mg via ORAL
  Filled 2020-10-07: qty 2

## 2020-10-07 MED ORDER — SODIUM CHLORIDE 0.9 % IV SOLN: Freq: Once | INTRAVENOUS | Status: DC

## 2020-10-07 MED ORDER — HYDROCODONE-ACETAMINOPHEN 5-325 MG PO TABS
0.5000 | ORAL_TABLET | Freq: Four times a day (QID) | ORAL | Status: DC | PRN
Start: 2020-10-07 — End: 2020-10-12
  Administered 2020-10-07: 1 via ORAL
  Filled 2020-10-07: qty 1

## 2020-10-07 MED ORDER — SODIUM CHLORIDE 0.9% FLUSH
3.0000 mL | Freq: Two times a day (BID) | INTRAVENOUS | Status: DC
  Administered 2020-10-07 – 2020-10-12 (×6): 3 mL via INTRAVENOUS

## 2020-10-07 MED ORDER — ENOXAPARIN SODIUM 40 MG/0.4ML IJ SOSY
40.0000 mg | PREFILLED_SYRINGE | INTRAMUSCULAR | Status: DC
  Administered 2020-10-07 – 2020-10-11 (×5): 40 mg via SUBCUTANEOUS
  Filled 2020-10-07 (×5): qty 0.4

## 2020-10-07 MED ORDER — ROSUVASTATIN CALCIUM 5 MG PO TABS: 10.0000 mg | ORAL_TABLET | ORAL | Status: DC

## 2020-10-07 MED ORDER — LATANOPROST 0.005 % OP SOLN
1.0000 [drp] | Freq: Every day | OPHTHALMIC | Status: DC
  Administered 2020-10-09 – 2020-10-11 (×3): 1 [drp] via OPHTHALMIC
  Filled 2020-10-07: qty 2.5

## 2020-10-07 MED ORDER — HYDRALAZINE HCL 20 MG/ML IJ SOLN
10.0000 mg | Freq: Once | INTRAMUSCULAR | Status: AC
  Administered 2020-10-07: 10 mg via INTRAVENOUS
  Filled 2020-10-07: qty 1

## 2020-10-07 MED ORDER — ONDANSETRON HCL 4 MG/2ML IJ SOLN: 4.0000 mg | Freq: Four times a day (QID) | INTRAMUSCULAR | Status: DC | PRN

## 2020-10-07 MED ORDER — TRAMADOL HCL 50 MG PO TABS: 50.0000 mg | ORAL_TABLET | Freq: Two times a day (BID) | ORAL | Status: DC | PRN

## 2020-10-07 MED ORDER — LEVOTHYROXINE SODIUM 25 MCG PO TABS
137.0000 ug | ORAL_TABLET | Freq: Every morning | ORAL | Status: DC
  Administered 2020-10-11: 137 ug via ORAL
  Filled 2020-10-07: qty 1

## 2020-10-07 MED ORDER — ONDANSETRON HCL 4 MG PO TABS
4.0000 mg | ORAL_TABLET | Freq: Four times a day (QID) | ORAL | Status: DC | PRN
Start: 2020-10-07 — End: 2020-10-07

## 2020-10-07 MED ORDER — METOPROLOL SUCCINATE ER 50 MG PO TB24
100.0000 mg | ORAL_TABLET | Freq: Every day | ORAL | Status: DC
  Administered 2020-10-07: 100 mg via ORAL
  Administered 2020-10-08: 50 mg via ORAL
  Administered 2020-10-09 – 2020-10-12 (×4): 100 mg via ORAL
  Filled 2020-10-07 (×4): qty 2
  Filled 2020-10-07: qty 4
  Filled 2020-10-07: qty 2

## 2020-10-07 MED ORDER — LABETALOL HCL 5 MG/ML IV SOLN: 20.0000 mg | Freq: Once | INTRAVENOUS | Status: DC

## 2020-10-07 NOTE — Progress Notes (Signed)
Orthopedic Tech Progress Note Patient Details:  [name] 1929/09/02 [id number] Level 2 Trauma Patient ID: [name], female   DOB: [birth year], 87 y.o.   MRN: [id number]  [name] 10/07/2020, 7:32 AM

## 2020-10-07 NOTE — ED Notes (Addendum)
Pt removed all cardiac monitoring, at the edge of the bed requesting to go home. Pt placed back in the bed, MD aware

## 2020-10-07 NOTE — ED Provider Notes (Signed)
[facility] EMERGENCY DEPARTMENT Provider Note   CSN: [id number] Arrival date & time: 10/07/20  [id number]     History Chief Complaint  Patient presents with   Trauma    [name] is a 87 y.o. female.  HPI Patient is brought by EMS for a fall in her driveway.  Patient's husband was at home and some additional history was obtained by EMS from the patient's husband.  Patient reports that she went out to get the mail and ended up falling in the driveway.  She is not sure why she fell.  She denies she was experiencing any chest pain or shortness of breath.  She did not have a headache.  She was not having any focal extremity weakness numbness or tingling.  After she fell, she reports she did have a lot of bleeding from her scalp and now has a posterior headache.  She denies any other pain.  She has not had nausea or vomiting.  She denies she is having any chest pain or shortness of breath after the fall.  She denies extremity pain.  Does not notice focal weakness or numbness of an extremity.  (Patient speech is clear and sentences well formed.  She is answering questions with a situational awareness and attentiveness.  There is some indication of perseveration and possibly confabulation.  At this time, it is unknown if patient has baseline dementia.)  EMS reports that the patient's husband reported she was on a blood thinner but they did not get any medication list or confirmation of any specific blood thinner.   09: 26 patient's husband has arrived to give additional history.  He reports patient has been at her baseline.  She does have dementia.  He reports for several weeks or more now, she has been more resistant to taking medications.  He has been able to sporadically get her to take her medications.  He reports she did take all of her medications yesterday.  She has not had them today.  He reports her current mental status is at baseline.  He reports that she does sometimes  have pretty severe systolic hypertension.  He reports is not all that unusual for her to get checked with the office and have pressures up to 220s.  He does report however her diastolic is usually pretty well controlled in the 70s or 80s.   No past medical history on file.  There are no problems to display for this patient.      OB History   No obstetric history on file.     History reviewed. No pertinent family history.  Social History   Tobacco Use   Smoking status: Unknown  Substance Use Topics   Drug use: Never    Home Medications Prior to Admission medications   Not on File    Allergies    Patient has no allergy information on record.  Review of Systems   Review of Systems 10 systems reviewed and negative except as per HPI Physical Exam Updated Vital Signs BP (!) 150/58   Pulse 60   Temp (!) 97.5 F (36.4 C) (Temporal)   Resp 16   Ht 5\' 7"  (1.702 m)   Wt 74.8 kg   SpO2 97%   BMI 25.84 kg/m   Physical Exam Constitutional:      Comments: Patient is alert.  She does not show signs of distress.  Her speech is clear and she is Estate agentpleasantly interactive with staff.  A lot  of blood matted in her hair but no active bleeding.  No signs of respiratory distress.  Sitting upright in the stretcher  HENT:     Head:     Comments: Extensive blood and matting in the scalp to the parietal right.  No active bleeding.  Will require cleaning to assess the wound.  No facial trauma    Nose: Nose normal.     Mouth/Throat:     Mouth: Mucous membranes are moist.     Pharynx: Oropharynx is clear.  Eyes:     Extraocular Movements: Extraocular movements intact.     Pupils: Pupils are equal, round, and reactive to light.  Cardiovascular:     Rate and Rhythm: Normal rate and regular rhythm.  Pulmonary:     Effort: Pulmonary effort is normal.     Breath sounds: Normal breath sounds.  Abdominal:     General: There is no distension.     Palpations: Abdomen is soft.     Tenderness:  There is no abdominal tenderness. There is no guarding.  Musculoskeletal:        General: Normal range of motion.     Cervical back: Neck supple.     Comments: No evident extremity injuries.  Patient is moving all extremities without difficulty.  No deformities.  No significant peripheral edema  Skin:    General: Skin is warm and dry.  Neurological:     Comments: Patient is alert and responding to questions appropriately.  There does seem to be repetitiveness and confabulation.  Question postconcussive symptoms versus dementia.  Speech is clear.  Patient follows all commands appropriately for grip strength, elevating each extremity independently and holding against resistance  Psychiatric:     Comments: Mood is congenial.    ED Results / Procedures / Treatments   Labs (all labs ordered are listed, but only abnormal results are displayed) Labs Reviewed  COMPREHENSIVE METABOLIC PANEL - Abnormal; Notable for the following components:      Result Value   Glucose, Bld 100 (*)    BUN 30 (*)    Creatinine, Ser 1.06 (*)    GFR, Estimated 50 (*)    All other components within normal limits  CBC WITH DIFFERENTIAL/PLATELET - Abnormal; Notable for the following components:   HCT 46.4 (*)    All other components within normal limits  URINALYSIS, ROUTINE W REFLEX MICROSCOPIC - Abnormal; Notable for the following components:   Color, Urine STRAW (*)    Hgb urine dipstick SMALL (*)    Protein, ur 100 (*)    All other components within normal limits  I-STAT CHEM 8, ED - Abnormal; Notable for the following components:   BUN 41 (*)    Creatinine, Ser 1.10 (*)    Glucose, Bld 100 (*)    Calcium, Ion 1.09 (*)    Hemoglobin 16.0 (*)    HCT 47.0 (*)    All other components within normal limits  RESP PANEL BY RT-PCR (FLU A&B, COVID) [id number]  PROTIME-INR  TYPE AND SCREEN  ABO/RH  TROPONIN I (HIGH SENSITIVITY)  TROPONIN I (HIGH SENSITIVITY)    EKG EKG Interpretation  Date/Time:  Saturday October 07 2020 12:53:03 EDT Ventricular Rate:  67 PR Interval:  203 QRS Duration: 162 QT Interval:  506 QTC Calculation: 535 R Axis:   222 Text Interpretation: Sinus rhythm Inferior infarct, old old tracing 2017 shows LBBB with anterior repolarization abnormality. inferior t waves normalized since EMS tracing. Confirmed by Arby Barrette [phone]) on  10/07/2020 1:11:04 PM  Radiology DG Elbow Complete Right  Result Date: 10/07/2020 CLINICAL DATA:  Fall.  Burning right elbow pain after fall. EXAM: RIGHT ELBOW - COMPLETE 3+ VIEW COMPARISON:  None. FINDINGS: Negative for fracture, dislocation or large joint effusion. Normal alignment of the right elbow. Subtle soft tissue lucency near the olecranon and cannot exclude a small laceration. IMPRESSION: No acute bone abnormality to the right elbow. Question a soft tissue injury or small laceration. Electronically Signed   By: Richarda Overlie M.D.   On: 10/07/2020 11:08   CT Head Wo Contrast  Result Date: 10/07/2020 CLINICAL DATA:  Penetrating head trauma. EXAM: CT HEAD WITHOUT CONTRAST CT CERVICAL SPINE WITHOUT CONTRAST TECHNIQUE: Multidetector CT imaging of the head and cervical spine was performed following the standard protocol without intravenous contrast. Multiplanar CT image reconstructions of the cervical spine were also generated. COMPARISON:  None. FINDINGS: CT HEAD FINDINGS Brain: Generalized atrophy. Chronic lacunar infarct at the left caudate nucleus. No evidence of acute infarct, hemorrhage, hydrocephalus, or mass. Vascular: No hyperdense vessel or unexpected calcification. Skull: Right posterior scalp hematoma.  No calvarial fracture Sinuses/Orbits: Bilateral cataract resection.  No visible injury. CT CERVICAL SPINE FINDINGS Alignment: No traumatic malalignment. Degenerative anterolisthesis at C3-4, C4-5, and C7-T1 Skull base and vertebrae: No acute fracture. No primary bone lesion or focal pathologic process. Soft tissues and spinal canal: No prevertebral  fluid or swelling. No visible canal hematoma. Disc levels: Diffuse degenerative disc narrowing and ridging. Multilevel facet spurring with C2-3 and C4-5 facet ankylosis. Advanced C1-2 facet osteoarthritis. Upper chest: Right apical pleural based scar like appearance. IMPRESSION: 1. No evidence of acute intracranial or cervical spine injury. 2. Scalp hematoma without calvarial fracture. Electronically Signed   By: Marnee Spring M.D.   On: 10/07/2020 08:16   CT Cervical Spine Wo Contrast  Result Date: 10/07/2020 CLINICAL DATA:  Penetrating head trauma. EXAM: CT HEAD WITHOUT CONTRAST CT CERVICAL SPINE WITHOUT CONTRAST TECHNIQUE: Multidetector CT imaging of the head and cervical spine was performed following the standard protocol without intravenous contrast. Multiplanar CT image reconstructions of the cervical spine were also generated. COMPARISON:  None. FINDINGS: CT HEAD FINDINGS Brain: Generalized atrophy. Chronic lacunar infarct at the left caudate nucleus. No evidence of acute infarct, hemorrhage, hydrocephalus, or mass. Vascular: No hyperdense vessel or unexpected calcification. Skull: Right posterior scalp hematoma.  No calvarial fracture Sinuses/Orbits: Bilateral cataract resection.  No visible injury. CT CERVICAL SPINE FINDINGS Alignment: No traumatic malalignment. Degenerative anterolisthesis at C3-4, C4-5, and C7-T1 Skull base and vertebrae: No acute fracture. No primary bone lesion or focal pathologic process. Soft tissues and spinal canal: No prevertebral fluid or swelling. No visible canal hematoma. Disc levels: Diffuse degenerative disc narrowing and ridging. Multilevel facet spurring with C2-3 and C4-5 facet ankylosis. Advanced C1-2 facet osteoarthritis. Upper chest: Right apical pleural based scar like appearance. IMPRESSION: 1. No evidence of acute intracranial or cervical spine injury. 2. Scalp hematoma without calvarial fracture. Electronically Signed   By: Marnee Spring M.D.   On:  10/07/2020 08:16   DG Pelvis Portable  Result Date: 10/07/2020 CLINICAL DATA:  Fall on blood thinners EXAM: PORTABLE PELVIS 1-2 VIEWS COMPARISON:  None. FINDINGS: There is no evidence of pelvic fracture or diastasis. No pelvic bone lesions are seen. Generalized osteopenia. The sacrum is largely obscured by bowel gas. IMPRESSION: Negative for fracture. Electronically Signed   By: Marnee Spring M.D.   On: 10/07/2020 08:17   DG Chest Portable 1 View  Result Date: 10/07/2020 CLINICAL  DATA:  Fall on blood thinners. EXAM: PORTABLE CHEST 1 VIEW COMPARISON:  05/09/2015 FINDINGS: Borderline cardiomegaly. Mitral annular calcification/ossification. Interstitial prominence which is stable. There is no edema, consolidation, effusion, or pneumothorax. No visible fracture. IMPRESSION: No evidence of acute disease. Electronically Signed   By: Marnee Spring M.D.   On: 10/07/2020 08:19    Procedures Procedures  CRITICAL CARE Performed by: Arby Barrette   Total critical care time: 33 minutes  Critical care time was exclusive of separately billable procedures and treating other patients.  Critical care was necessary to treat or prevent imminent or life-threatening deterioration.  Critical care was time spent personally by me on the following activities: development of treatment plan with patient and/or surrogate as well as nursing, discussions with consultants, evaluation of patient's response to treatment, examination of patient, obtaining history from patient or surrogate, ordering and performing treatments and interventions, ordering and review of laboratory studies, ordering and review of radiographic studies, pulse oximetry and re-evaluation of patient's condition.  Medications Ordered in ED Medications  labetalol (NORMODYNE) injection 20 mg (20 mg Intravenous Not Given 10/07/20 0958)  hydrALAZINE (APRESOLINE) injection 10 mg (10 mg Intravenous Given 10/07/20 0829)  hydrALAZINE (APRESOLINE) injection  20 mg (20 mg Intravenous Given 10/07/20 0921)  acetaminophen (TYLENOL) tablet 1,000 mg (1,000 mg Oral Given 10/07/20 0921)  fentaNYL (SUBLIMAZE) injection 25 mcg (25 mcg Intravenous Given 10/07/20 1043)    ED Course  I have reviewed the triage vital signs and the nursing notes.  Pertinent labs & imaging results that were available during my care of the patient were reviewed by me and considered in my medical decision making (see chart for details).    MDM Rules/Calculators/A&P                           08: 26 left message on contact number for patient's spouse.  No other contact numbers in the chart at this time.  Patient had a fall.  Reportedly on blood thinners.  Will need some additional history confirmation when a family member can be contacted or arrives.  At this time no evidence of intracranial bleeding.  Currently this appears consistent with a mechanical fall.  There is no focal neurologic deficits to suggest acute stroke.  Patient does not endorse any chest pain or shortness of breath.  EKG has left bundle with ST elevation and T wave inversions inferiorly.  This is consistent with prior EKG with left bundle however inferior T waves are new from last available which is 85 years old.  We will proceed with troponins.  But at this time no chest pain or dyspnea to suggest acute MI.  We will continue to monitor.  Patient is severely hypertensive.  Will start hydralazine.  Heart rate is 67.  09: 29 patient is not showing response to hydralazine for blood pressure control.  She continues to have significantly elevated diastolic pressure with systolics remaining 190s to 220s.  Husband has brought medications.  She is on metoprolol at home.  Will give IV dose of labetalol.  May need to transition to continuous infusion if patient does not show some improvement in diastolic blood pressures.  At this time, mental status is staying at baseline.  Patient complains of posterior headache.  This could be  secondary to hypertension but also patient clearly has a traumatic head injury but no bleeding on CT.  Patient's EKG shows left bundle with repolarization abnormality.  Slight increase as  compared to older tracing 2017 but similar with left bundle and anterior repolarization elevation.  Some dynamic changes from EMS tracing in the inferior leads.  Possible ischemia but first troponin negative.  Patient did not any point time endorse chest pain or shortness of breath.  Patient did arrive with significant hypertension.  This is now normalized.  Still no complaints of chest pain. Final Clinical Impression(s) / ED Diagnoses Final diagnoses:  Fall, initial encounter  Concussion without loss of consciousness, initial encounter  Hypertensive urgency  Dementia without behavioral disturbance, unspecified dementia type (HCC)  Contusion of right elbow, initial encounter    Rx / DC Orders ED Discharge Orders     None        Arby Barrette, MD 10/07/20 1315

## 2020-10-07 NOTE — ED Notes (Signed)
Admitting at bedside 

## 2020-10-07 NOTE — ED Notes (Signed)
Pt to CT with TRN  

## 2020-10-07 NOTE — ED Notes (Addendum)
Pt removed monitoring equipment, attempting to get out of bed. RN and NT at bedside, got pt back into bed. Pt removed IV

## 2020-10-07 NOTE — ED Notes (Signed)
Pt c/o leg pain, offered pain medication. Pt agreeable to take medication

## 2020-10-07 NOTE — H&P (Addendum)
History and Physical    [name] EHU:[id number] DOB: [birth year] DOA: 10/07/2020  Referring MD/NP/PA: Arby Barrette, MD PCP: Gweneth Dimitri, MD  Patient coming from: Home  Chief Complaint: Fall I have personally briefly reviewed patient's old medical records in [facility]   HPI: [name] is a 87 y.o. female with medical history significant of hypertension, hypothyroidism, and dementia who presents after having a fall at home.  History is obtained from review of records and is present at bedside as she has some dementia.  Patient cannot tell he how or why she fell and is not totally clear if she lost consciousness or not.  Has been reports that she had gone out to get the mail and was using her cane.  Husband went to check on her after she did not come back in the house after recent round of time found her laying on the ground with blood coming from back of her scalp.  She complains of pain in her right elbow.  Denies having any shortness of breath, chest pain, nausea, vomiting, or diarrhea symptoms.  She repeatedly states that her husband does not work and can take her home now.  At baseline patient husband reports that she has dementia and she was alert and oriented to person and sometimes date.  However she is unable to tell me where she is, the year, or date at this time.  At home he notes that she has been refusing to take her medications at times and her primary care provider had been working to get better control over her blood pressures.  Review of records on Care Everywhere does show that that patient's blood pressures have been elevated into the 200s at last few office visits.  ED Course: Upon admission to the emergency department patient was noted to be afebrile, pulse 58-73, respirations 15-25, blood pressure was elevated up to 256/93, and O2 saturation maintained on room air.  CT scan of the head and cervical spine showed no evidence of acute intracranial cervical  spine injury, but did note a scalp hematoma without calvarial fracture.  Labs significant for BUN 30 and creatinine 1.06.  Influenza and COVID-19 screening were negative.  Chest and pelvic x-rays showed no acute abnormality.  Urinalysis showed no significant signs of infection.  X-ray was also done of the right elbow with no acute bony abnormality appreciated but there was question of a possible soft tissue injury or small laceration patient had been given a total of hydralazine 30 mg IV with some improvement in blood pressure  Review of Systems  Unable to perform ROS: Dementia  Respiratory:  Negative for shortness of breath.   Cardiovascular:  Negative for chest pain.  Gastrointestinal:  Negative for abdominal pain, nausea and vomiting.  Musculoskeletal:  Positive for falls and joint pain.  Psychiatric/Behavioral:  Positive for memory loss. The patient is nervous/anxious.     Past Medical History  Diagnosis Date   Hypertension     Thyroid disease     Hemorrhoids      History of left lower extremity DVT following arthroscopy and also     a second time she had multiple blood clots in her leg. Urinary incontinence.          Past Surgical History  Procedure Laterality Date   Cholecystectomy       Appendectomy       Tubal ligation       Joint replacement  left knee   Hysterectomy in 1976. Cataract surgery in 2008, and again in 2010. Colonoscopy procedure.    FAMILY HISTORY:  Father deceased in his 14s with kidney issues, he had an urostomy.  Mother deceased in her 74s.   SOCIAL HISTORY:  Married, retired Engineer, civil (consulting), nonsmoker.  No alcohol or illicit drug use.  She has 2 steps to entering her home.  She does have a living will.     Allergies  Allergen Reactions   Elemental Sulfur Anaphylaxis   Penicillins Anaphylaxis   Cortisone     Break out   Sulfa Antibiotics     History reviewed. No pertinent family history.  Prior to Admission medications   Medication Sig  Start Date End Date Taking? Authorizing Provider  amoxicillin (AMOXIL) 500 MG capsule Take 500 mg by mouth as needed (dental procedures).   Yes [provider]  latanoprost (XALATAN) 0.005 % ophthalmic solution Place 1 drop into both eyes at bedtime. 09/05/20  Yes [provider]  levothyroxine (SYNTHROID) 137 MCG tablet Take 137 mcg by mouth every morning. 07/31/20  Yes [provider]  losartan (COZAAR) 100 MG tablet Take 100 mg by mouth daily. 08/07/20  Yes [provider]  metoprolol succinate (TOPROL-XL) 100 MG 24 hr tablet Take 100 mg by mouth daily. 06/03/20  Yes [provider]  rosuvastatin (CRESTOR) 10 MG tablet Take 10 mg by mouth 2 (two) times a week. 06/03/20  Yes [provider]  vitamin B-12 (CYANOCOBALAMIN) 1000 MCG tablet Take 1,000 mcg by mouth every other day.   Yes [provider]    Physical Exam:  Constitutional: Elderly female who appears to be in some distress but able to follow commands Vitals:   10/07/20 1230 10/07/20 1300 10/07/20 1315 10/07/20 1330  BP: (!) 157/56 (!) 150/58 (!) 157/60 (!) 165/64  Pulse: 61 60 63 64  Resp: (!) Temp:      TempSrc:      SpO2: 96% 97% 96% 95%  Weight:      Height:       Eyes: PERRL, lids and conjunctivae normal ENMT: Mucous membranes are moist. Posterior pharynx clear of any exudate or lesions.  Neck: normal, supple, no masses, no thyromegaly Respiratory: clear to auscultation bilaterally, no wheezing, no crackles. Normal respiratory effort. No accessory muscle use.  Cardiovascular: Regular rate and rhythm, no murmurs / rubs / gallops.  Trace lower extremity edema. 2+ pedal pulses. No carotid bruits.  Abdomen: no tenderness, no masses palpated. No hepatosplenomegaly. Bowel sounds positive.  Musculoskeletal: no clubbing / cyanosis. No joint deformity upper and lower extremities. Good ROM, no contractures. Normal muscle tone.  Skin: Contusion with dried blood  present to the posterior right-sided scalp.  Abrasion noted to the right elbow. Neurologic: CN 2-12 grossly intact. Sensation intact, DTR normal. Strength 5/5 in all 4.  Psychiatric: Poor recent memory.  Alert and oriented x person.  Anxious mood and intermittently tearful    Labs on Admission: I have personally reviewed following labs and imaging studies  CBC: Recent Labs  Lab 10/07/20 0734 10/07/20 0739  WBC 7.1  --   NEUTROABS 4.5  --   HGB 14.7 16.0*  HCT 46.4* 47.0*  MCV 93.0  --   PLT 207  --    Basic Metabolic Panel: Recent Labs  Lab 10/07/20 0734 10/07/20 0739  NA 139 141  K 4.5 4.6  CL 104 104  CO2 26  --   GLUCOSE 100*  100*  BUN 30* 41*  CREATININE 1.06* 1.10*  CALCIUM 9.7  --    GFR: Estimated Creatinine Clearance: 36.6 mL/min (A) (by C-G formula based on SCr of 1.1 mg/dL (H)). Liver Function Tests: Recent Labs  Lab 10/07/20 0734  AST 37  ALT 26  ALKPHOS 62  BILITOT 0.8  PROT 7.6  ALBUMIN 4.0   No results for input(s): LIPASE, AMYLASE in the last 168 hours. No results for input(s): AMMONIA in the last 168 hours. Coagulation Profile: Recent Labs  Lab 10/07/20 0734  INR 1.0   Cardiac Enzymes: No results for input(s): CKTOTAL, CKMB, CKMBINDEX, TROPONINI in the last 168 hours. BNP (last 3 results) No results for input(s): PROBNP in the last 8760 hours. HbA1C: No results for input(s): HGBA1C in the last 72 hours. CBG: No results for input(s): GLUCAP in the last 168 hours. Lipid Profile: No results for input(s): CHOL, HDL, LDLCALC, TRIG, CHOLHDL, LDLDIRECT in the last 72 hours. Thyroid Function Tests: No results for input(s): TSH, T4TOTAL, FREET4, T3FREE, THYROIDAB in the last 72 hours. Anemia Panel: No results for input(s): VITAMINB12, FOLATE, FERRITIN, TIBC, IRON, RETICCTPCT in the last 72 hours. Urine analysis:    Component Value Date/Time   COLORURINE STRAW (A) 10/07/2020 0752   APPEARANCEUR CLEAR 10/07/2020 0752   LABSPEC 1.008  10/07/2020 0752   PHURINE 7.0 10/07/2020 0752   GLUCOSEU NEGATIVE 10/07/2020 0752   HGBUR SMALL (A) 10/07/2020 0752   BILIRUBINUR NEGATIVE 10/07/2020 0752   KETONESUR NEGATIVE 10/07/2020 0752   PROTEINUR 100 (A) 10/07/2020 0752   NITRITE NEGATIVE 10/07/2020 0752   LEUKOCYTESUR NEGATIVE 10/07/2020 0752   Sepsis Labs: Recent Results (from the past 240 hour(s))  Resp Panel by RT-PCR (Flu A&B, Covid) Nasopharyngeal Swab     Status: None   Collection Time: 10/07/20  9:33 AM   Specimen: Nasopharyngeal Swab; Nasopharyngeal(NP) swabs in vial transport medium  Result Value Ref Range Status   SARS Coronavirus 2 by RT PCR NEGATIVE NEGATIVE Final    Comment: (NOTE) SARS-CoV-2 target nucleic acids are NOT DETECTED.  The SARS-CoV-2 RNA is generally detectable in upper respiratory specimens during the acute phase of infection. The lowest concentration of SARS-CoV-2 viral copies this assay can detect is 138 copies/mL. A negative result does not preclude SARS-Cov-2 infection and should not be used as the sole basis for treatment or other patient management decisions. A negative result may occur with  improper specimen collection/handling, submission of specimen other than nasopharyngeal swab, presence of viral mutation(s) within the areas targeted by this assay, and inadequate number of viral copies(<138 copies/mL). A negative result must be combined with clinical observations, patient history, and epidemiological information. The expected result is Negative.  Fact Sheet for Patients:  BloggerCourse.com  Fact Sheet for Healthcare Providers:  SeriousBroker.it  This test is no t yet approved or cleared by the [place] FDA and  has been authorized for detection and/or diagnosis of SARS-CoV-2 by FDA under an Emergency Use Authorization (EUA). This EUA will remain  in effect (meaning this test can be used) for the duration of the COVID-19  declaration under Section 564(b)(1) of the Act, 21 U.S.C.section 360bbb-3(b)(1), unless the authorization is terminated  or revoked sooner.       Influenza A by PCR NEGATIVE NEGATIVE Final   Influenza B by PCR NEGATIVE NEGATIVE Final    Comment: (NOTE) The Xpert Xpress SARS-CoV-2/FLU/RSV plus assay is intended as an aid in the diagnosis of influenza from Nasopharyngeal swab specimens and should not be  used as a sole basis for treatment. Nasal washings and aspirates are unacceptable for Xpert Xpress SARS-CoV-2/FLU/RSV testing.  Fact Sheet for Patients: BloggerCourse.com  Fact Sheet for Healthcare Providers: SeriousBroker.it  This test is not yet approved or cleared by the [place] FDA and has been authorized for detection and/or diagnosis of SARS-CoV-2 by FDA under an Emergency Use Authorization (EUA). This EUA will remain in effect (meaning this test can be used) for the duration of the COVID-19 declaration under Section 564(b)(1) of the Act, 21 U.S.C. section 360bbb-3(b)(1), unless the authorization is terminated or revoked.  Performed at [facility], [address] [place], [place] [address]      Radiological Exams on Admission: DG Elbow Complete Right  Result Date: 10/07/2020 CLINICAL DATA:  Fall.  Burning right elbow pain after fall. EXAM: RIGHT ELBOW - COMPLETE 3+ VIEW COMPARISON:  None. FINDINGS: Negative for fracture, dislocation or large joint effusion. Normal alignment of the right elbow. Subtle soft tissue lucency near the olecranon and cannot exclude a small laceration. IMPRESSION: No acute bone abnormality to the right elbow. Question a soft tissue injury or small laceration. Electronically Signed   By: Richarda Overlie M.D.   On: 10/07/2020 11:08   CT Head Wo Contrast  Result Date: 10/07/2020 CLINICAL DATA:  Penetrating head trauma. EXAM: CT HEAD WITHOUT CONTRAST CT CERVICAL SPINE WITHOUT CONTRAST TECHNIQUE:  Multidetector CT imaging of the head and cervical spine was performed following the standard protocol without intravenous contrast. Multiplanar CT image reconstructions of the cervical spine were also generated. COMPARISON:  None. FINDINGS: CT HEAD FINDINGS Brain: Generalized atrophy. Chronic lacunar infarct at the left caudate nucleus. No evidence of acute infarct, hemorrhage, hydrocephalus, or mass. Vascular: No hyperdense vessel or unexpected calcification. Skull: Right posterior scalp hematoma.  No calvarial fracture Sinuses/Orbits: Bilateral cataract resection.  No visible injury. CT CERVICAL SPINE FINDINGS Alignment: No traumatic malalignment. Degenerative anterolisthesis at C3-4, C4-5, and C7-T1 Skull base and vertebrae: No acute fracture. No primary bone lesion or focal pathologic process. Soft tissues and spinal canal: No prevertebral fluid or swelling. No visible canal hematoma. Disc levels: Diffuse degenerative disc narrowing and ridging. Multilevel facet spurring with C2-3 and C4-5 facet ankylosis. Advanced C1-2 facet osteoarthritis. Upper chest: Right apical pleural based scar like appearance. IMPRESSION: 1. No evidence of acute intracranial or cervical spine injury. 2. Scalp hematoma without calvarial fracture. Electronically Signed   By: Marnee Spring M.D.   On: 10/07/2020 08:16   CT Cervical Spine Wo Contrast  Result Date: 10/07/2020 CLINICAL DATA:  Penetrating head trauma. EXAM: CT HEAD WITHOUT CONTRAST CT CERVICAL SPINE WITHOUT CONTRAST TECHNIQUE: Multidetector CT imaging of the head and cervical spine was performed following the standard protocol without intravenous contrast. Multiplanar CT image reconstructions of the cervical spine were also generated. COMPARISON:  None. FINDINGS: CT HEAD FINDINGS Brain: Generalized atrophy. Chronic lacunar infarct at the left caudate nucleus. No evidence of acute infarct, hemorrhage, hydrocephalus, or mass. Vascular: No hyperdense vessel or unexpected  calcification. Skull: Right posterior scalp hematoma.  No calvarial fracture Sinuses/Orbits: Bilateral cataract resection.  No visible injury. CT CERVICAL SPINE FINDINGS Alignment: No traumatic malalignment. Degenerative anterolisthesis at C3-4, C4-5, and C7-T1 Skull base and vertebrae: No acute fracture. No primary bone lesion or focal pathologic process. Soft tissues and spinal canal: No prevertebral fluid or swelling. No visible canal hematoma. Disc levels: Diffuse degenerative disc narrowing and ridging. Multilevel facet spurring with C2-3 and C4-5 facet ankylosis. Advanced C1-2 facet osteoarthritis. Upper chest: Right  apical pleural based scar like appearance. IMPRESSION: 1. No evidence of acute intracranial or cervical spine injury. 2. Scalp hematoma without calvarial fracture. Electronically Signed   By: Marnee SpringJonathon  Watts M.D.   On: 10/07/2020 08:16   DG Pelvis Portable  Result Date: 10/07/2020 CLINICAL DATA:  Fall on blood thinners EXAM: PORTABLE PELVIS 1-2 VIEWS COMPARISON:  None. FINDINGS: There is no evidence of pelvic fracture or diastasis. No pelvic bone lesions are seen. Generalized osteopenia. The sacrum is largely obscured by bowel gas. IMPRESSION: Negative for fracture. Electronically Signed   By: Marnee SpringJonathon  Watts M.D.   On: 10/07/2020 08:17   DG Chest Portable 1 View  Result Date: 10/07/2020 CLINICAL DATA:  Fall on blood thinners. EXAM: PORTABLE CHEST 1 VIEW COMPARISON:  05/09/2015 FINDINGS: Borderline cardiomegaly. Mitral annular calcification/ossification. Interstitial prominence which is stable. There is no edema, consolidation, effusion, or pneumothorax. No visible fracture. IMPRESSION: No evidence of acute disease. Electronically Signed   By: Marnee SpringJonathon  Watts M.D.   On: 10/07/2020 08:19    EKG: Independently reviewed.  Sinus rhythm at 67 bpm with old inferior infarct and prolonged QT interval of 537  Assessment/Plan Fall, at home head contusion: Acute.  Patient had a unwitnessed fall  in the driveway while going to to get the mail.  At baseline patient walks with the use of a cane for it is unclear if patient just lost her footing and slipped or what caused the fall.  CT imaging of the head and cervical spine did not show any acute signs of fracture or intracranial abnormality, but did note scalp hematoma for which patient had been noted to be bleeding from her head but did not require staples.  Hemoglobin appears to be stable.  Husband was worried that he could not care for her at home and wanted to make sure she was steady on her feet. -Admit to a medical telemetry -Recheck H&H in a.m. -PT/OT to evaluate and treat in a.m. -Hydrocodone 2.5 to 5 - 325 mg every 6 hours as needed pain due to tramadol possibly worsening QT interval -Follow-up telemetry overnight -Transitions of care consulted, for possible need of home health   Hypertensive urgency/possible emergency: Acute.  On admission patient blood pressure was noted to be elevated i.  Home blood pressure medications include losartan 100 mg daily and metoprolol 100 mg daily. -Resume home blood pressure regimen -Hydralazine IV as needed for elevated blood pressures greater than 180  Chronic kidney disease stage IIIa: On admission patient's creatinine is 1.1 with BUN 41.  The elevated BUN to creatinine ratio gives concern for prerenal cause of symptoms.  Patient may have an aspect of dehydration which contributed to her fall given her history of dementia. -Gentle IV fluids at 50 mL/h overnight x1 L -Recheck kidney function in a.m.  Elevated troponin: High-sensitivity troponin was initially 13 with repeat 26.  Patient denies any complaints of chest pain at this time.  Possibly secondary to demand in the setting of patient's elevated blood pressures. -Continue to trend cardiac troponin  Dementia: Patient appears only oriented to person at this time.  She does seem to get very anxious and tearful during her evaluation. -Delirium  precautions -Bed alarm on  Prolonged QT interval: QTC was 537. -Avoid any QT prolonging medications  Hypothyroidism -Add-on TSH -Continue levothyroxine  Hyperlipidemia -Continue Crestor  DVT prophylaxis: Lovenox  code Status: Full Family Communication: Husband updated at bedside Disposition Plan: To be determined Consults called: None Admission status: Observation requiring less than 24-hour  stay  Clydie Braun MD [facility]   If 7PM-7AM, please contact night-coverage   10/07/2020, 2:00 PM

## 2020-10-07 NOTE — ED Provider Notes (Signed)
[facility]-EMERGENCY DEPT Provider Note   CSN: [id number] Arrival date & time: 12/21/15  2409    This note somehow has been erroneously signed a date of 10\5\17.  There is a full note dictated from the patient's encounter to which this note belongs.  This note appears to be the first draft for visit 7\23\2022.  Note should be disregarded but cannot be declined or deleted. History Chief Complaint  Patient presents with   Leg Injury   Leg Pain    [name] is a 87 y.o. female.  HPI Patient reportedly was out side going to get her mail.  She does not know how or why she fell.  Reportedly, she seemed that she was outside little longer than anticipated and patient's husband found her down in the driveway.  Patient reports she has posterior headache.  She denies other pain.    Past Medical History:  Diagnosis Date   DVT, lower extremity (HCC)    Hemorrhoids    Hypertension    Thyroid disease     Patient Active Problem List   Diagnosis Date Noted   Acute bronchitis with asthma 05/09/2015   Acute bronchitis 05/09/2015   Essential hypertension 06/23/2014   Hyperlipidemia 06/23/2014   Bilateral lower extremity edema 06/23/2014   HYPOTHYROIDISM, POST-RADIATION 10/21/2007   OSTEOARTHRITIS 07/20/2007   DEEP VENOUS THROMBOPHLEBITIS, HX OF 07/20/2007    Past Surgical History:  Procedure Laterality Date   ABDOMINAL HYSTERECTOMY     APPENDECTOMY     CHOLECYSTECTOMY     JOINT REPLACEMENT     left knee   right knee replacement     TUBAL LIGATION       OB History   No obstetric history on file.     Family History  Problem Relation Age of Onset   Cancer Father     Social History   Tobacco Use   Smoking status: Never   Smokeless tobacco: Never  Substance Use Topics   Alcohol use: No   Drug use: No    Home Medications Prior to Admission medications   Medication Sig Start Date End Date Taking? Authorizing Provider  amLODipine (NORVASC)  5 MG tablet Take 5 mg by mouth daily.     Yes [provider]  cholecalciferol (VITAMIN D) 1000 units tablet Take 2,000 Units by mouth daily.   Yes [provider]  clobetasol ointment (TEMOVATE) 0.05 % Apply 1 application topically every other day.  11/06/15  Yes [provider]  levothyroxine (SYNTHROID, LEVOTHROID) 125 MCG tablet Take 62.5-125 mcg by mouth daily. Takes everyday except on Sunday's takes 62.   Yes [provider]  losartan (COZAAR) 100 MG tablet Take 100 mg by mouth daily.     Yes [provider]    Allergies    Contrast media [iodinated diagnostic agents], Penicillins, and Codeine  Review of Systems   Review of Systems Disregard other note is done Physical Exam Updated Vital Signs BP 191/67 (BP Location: Left Arm)   Pulse 64   Temp 98.7 F (37.1 C) (Oral)   Resp 17   Ht 5\' 6"  (1.676 m)   Wt 99.3 kg   SpO2 99%   BMI 35.35 kg/m   Physical Exam Disregard ED Results / Procedures / Treatments   Labs (all labs ordered are listed, but only abnormal results are displayed) Labs Reviewed - No data to display  EKG None  Radiology No results found.  Procedures Procedures   Medications  Ordered in ED Medications - No data to display  ED Course  I have reviewed the triage vital signs and the nursing notes.  Pertinent labs & imaging results that were available during my care of the patient were reviewed by me and considered in my medical decision making (see chart for details).    MDM Rules/Calculators/A&P                            Final Clinical Impression(s) / ED Diagnoses Final diagnoses:  Contusion of left knee, initial encounter    Rx / DC Orders ED Discharge Orders     None        Arby Barrette, MD 10/13/20 [id number]

## 2020-10-07 NOTE — ED Triage Notes (Signed)
Patient presents to ED via [facility] states he was walking in her driveway to get the paper and fell unsure how or what caused her to fall. C/o headache , large amt of dried blood on posterior  right side of her head. Questionable LOC ,

## 2020-10-07 NOTE — ED Notes (Addendum)
Pt's husband, Louanne Skye at bedside.  Husband states pt has a hx of dementia and hypertension.  She is in fact not on a blood thinner.

## 2020-10-07 NOTE — ED Notes (Signed)
Trauma Response Nurse Note-  Reason for Call / Reason for Trauma activation:   - L2 fall on thinners  Initial Focused Assessment (If applicable, or please see trauma documentation):  - Blood to R side of head w/ lac and scabbing over - Lac to R elbow - Makeshift pelvic binder around pt's pelvis w/ poss shortening to LL. - Pt confused and perseverating - hypertensive on arrival manual BP 220/120  Interventions:  - trauma Labs - chest and pelvic x-rays (neg) - Head and neck CT (neg) - hydralazine given - tylenol given due to c/o 10 out of 10 HA - Spoke with pt's husband and received pt hx and med hx - Cleaned head with hibiclens  Plan of Care as of this note:  - Attempt to lower BP - May admit due to hypertension per EDP. - Will hold labetalol for now due to BP being 113/75  Event Summary:   - Pt was walking in driveway attempting to receive newspaper and claims her feet slipped out from under her.  She fell on her R side injuring the right side of her head and R elbow.  Husband witnessed fall and called EMS.  No LOC.  Only complains of HA.   [place], [place] [phone]

## 2020-10-07 NOTE — ED Notes (Signed)
Pt agreeable to taking metoprolol. 100mg  given, see MAR

## 2020-10-07 NOTE — ED Notes (Addendum)
Pt requesting to go home. I messaged Dr. Katrinka Blazing and he said if they want to go home they will have to sign out AMA. Pt's RN informed.

## 2020-10-08 ENCOUNTER — Encounter (HOSPITAL_COMMUNITY): Payer: Self-pay | Admitting: Family Medicine

## 2020-10-08 DIAGNOSIS — S060X0A Concussion without loss of consciousness, initial encounter: Secondary | ICD-10-CM

## 2020-10-08 DIAGNOSIS — D72828 Other elevated white blood cell count: Secondary | ICD-10-CM | POA: Diagnosis present

## 2020-10-08 DIAGNOSIS — W19XXXA Unspecified fall, initial encounter: Secondary | ICD-10-CM | POA: Diagnosis not present

## 2020-10-08 DIAGNOSIS — I4581 Long QT syndrome: Secondary | ICD-10-CM | POA: Diagnosis present

## 2020-10-08 DIAGNOSIS — Y92014 Private driveway to single-family (private) house as the place of occurrence of the external cause: Secondary | ICD-10-CM | POA: Diagnosis not present

## 2020-10-08 DIAGNOSIS — M7989 Other specified soft tissue disorders: Secondary | ICD-10-CM | POA: Diagnosis not present

## 2020-10-08 DIAGNOSIS — R778 Other specified abnormalities of plasma proteins: Secondary | ICD-10-CM | POA: Diagnosis present

## 2020-10-08 DIAGNOSIS — Z20822 Contact with and (suspected) exposure to covid-19: Secondary | ICD-10-CM | POA: Diagnosis present

## 2020-10-08 DIAGNOSIS — E785 Hyperlipidemia, unspecified: Secondary | ICD-10-CM | POA: Diagnosis present

## 2020-10-08 DIAGNOSIS — E860 Dehydration: Secondary | ICD-10-CM | POA: Diagnosis present

## 2020-10-08 DIAGNOSIS — R600 Localized edema: Secondary | ICD-10-CM | POA: Diagnosis present

## 2020-10-08 DIAGNOSIS — Z96652 Presence of left artificial knee joint: Secondary | ICD-10-CM | POA: Diagnosis present

## 2020-10-08 DIAGNOSIS — Z86718 Personal history of other venous thrombosis and embolism: Secondary | ICD-10-CM | POA: Diagnosis not present

## 2020-10-08 DIAGNOSIS — S5001XA Contusion of right elbow, initial encounter: Secondary | ICD-10-CM | POA: Diagnosis present

## 2020-10-08 DIAGNOSIS — I447 Left bundle-branch block, unspecified: Secondary | ICD-10-CM | POA: Diagnosis present

## 2020-10-08 DIAGNOSIS — Z90710 Acquired absence of both cervix and uterus: Secondary | ICD-10-CM | POA: Diagnosis not present

## 2020-10-08 DIAGNOSIS — F0391 Unspecified dementia with behavioral disturbance: Secondary | ICD-10-CM | POA: Diagnosis present

## 2020-10-08 DIAGNOSIS — N179 Acute kidney failure, unspecified: Secondary | ICD-10-CM | POA: Diagnosis present

## 2020-10-08 DIAGNOSIS — M79606 Pain in leg, unspecified: Secondary | ICD-10-CM | POA: Diagnosis not present

## 2020-10-08 DIAGNOSIS — W1839XA Other fall on same level, initial encounter: Secondary | ICD-10-CM | POA: Diagnosis present

## 2020-10-08 DIAGNOSIS — E039 Hypothyroidism, unspecified: Secondary | ICD-10-CM | POA: Diagnosis present

## 2020-10-08 DIAGNOSIS — Y939 Activity, unspecified: Secondary | ICD-10-CM | POA: Diagnosis not present

## 2020-10-08 DIAGNOSIS — Z880 Allergy status to penicillin: Secondary | ICD-10-CM | POA: Diagnosis not present

## 2020-10-08 DIAGNOSIS — S0003XA Contusion of scalp, initial encounter: Secondary | ICD-10-CM | POA: Diagnosis present

## 2020-10-08 DIAGNOSIS — I129 Hypertensive chronic kidney disease with stage 1 through stage 4 chronic kidney disease, or unspecified chronic kidney disease: Secondary | ICD-10-CM | POA: Diagnosis present

## 2020-10-08 DIAGNOSIS — N1831 Chronic kidney disease, stage 3a: Secondary | ICD-10-CM | POA: Diagnosis present

## 2020-10-08 DIAGNOSIS — R32 Unspecified urinary incontinence: Secondary | ICD-10-CM | POA: Diagnosis present

## 2020-10-08 DIAGNOSIS — I160 Hypertensive urgency: Secondary | ICD-10-CM | POA: Diagnosis present

## 2020-10-08 DIAGNOSIS — R441 Visual hallucinations: Secondary | ICD-10-CM | POA: Diagnosis present

## 2020-10-08 DIAGNOSIS — S50311A Abrasion of right elbow, initial encounter: Secondary | ICD-10-CM | POA: Diagnosis present

## 2020-10-08 LAB — BASIC METABOLIC PANEL
Anion gap: 13 (ref 5–15)
BUN: 28 mg/dL — ABNORMAL HIGH (ref 8–23)
CO2: 22 mmol/L (ref 22–32)
Calcium: 9.5 mg/dL (ref 8.9–10.3)
Chloride: 104 mmol/L (ref 98–111)
Creatinine, Ser: 1.08 mg/dL — ABNORMAL HIGH (ref 0.44–1.00)
GFR, Estimated: 49 mL/min — ABNORMAL LOW (ref >60)
Glucose, Bld: 132 mg/dL — ABNORMAL HIGH (ref 70–99)
Potassium: 3.7 mmol/L (ref 3.5–5.1)
Sodium: 139 mmol/L (ref 135–145)

## 2020-10-08 LAB — TYPE AND SCREEN
ABO/RH(D): A POS
ABO/RH(D): A POS
Antibody Screen: NEGATIVE
Antibody Screen: NEGATIVE

## 2020-10-08 LAB — CBC
HCT: 43.4 % (ref 36.0–46.0)
Hemoglobin: 13.9 g/dL (ref 12.0–15.0)
MCH: 29.3 pg (ref 26.0–34.0)
MCHC: 32 g/dL (ref 30.0–36.0)
MCV: 91.6 fL (ref 80.0–100.0)
Platelets: 225 10*3/uL (ref 150–400)
RBC: 4.74 MIL/uL (ref 3.87–5.11)
RDW: 14.2 % (ref 11.5–15.5)
WBC: 11.4 10*3/uL — ABNORMAL HIGH (ref 4.0–10.5)
nRBC: 0 % (ref 0.0–0.2)

## 2020-10-08 LAB — TSH: TSH: 0.303 u[IU]/mL — ABNORMAL LOW (ref 0.350–4.500)

## 2020-10-08 LAB — TROPONIN I (HIGH SENSITIVITY): Troponin I (High Sensitivity): 88 ng/L — ABNORMAL HIGH (ref <18)

## 2020-10-08 MED ORDER — ALPRAZOLAM 0.25 MG PO TABS
0.2500 mg | ORAL_TABLET | Freq: Two times a day (BID) | ORAL | Status: DC | PRN
  Administered 2020-10-09: 0.25 mg via ORAL
  Filled 2020-10-08: qty 1

## 2020-10-08 MED ORDER — ALPRAZOLAM 0.25 MG PO TABS
0.2500 mg | ORAL_TABLET | Freq: Once | ORAL | Status: AC
  Administered 2020-10-08: 0.25 mg via ORAL
  Filled 2020-10-08: qty 1

## 2020-10-08 MED ORDER — LORAZEPAM 2 MG/ML IJ SOLN: 0.5000 mg | Freq: Once | INTRAMUSCULAR | Status: DC

## 2020-10-08 NOTE — TOC Initial Note (Addendum)
Transition of Care [facility]) - Initial/Assessment Note    Patient Details  Name: [name] MRN: [id number] Date of Birth: 1929-11-04  Transition of Care [facility]) CM/SW Contact:    Levada Schilling Phone Number: 10/08/2020, 12:45 PM  Clinical Narrative:                  CSW spoke with pt's spouse concerning Possible SNF/Memory Care for pt.  Pt and Pt's spouse has a son who is not able to care for pt due to poor health according to pt's spouse.  Pt's spouse is no longer able to care for spouse's medical needs at home. Pt's spouse has been taking care of pt at home for 11 years.  Pt's spouse has had HH agencies come into the home however it was not successful due to the pt not letting HH agencies complete their duties.  Pt's health has been declining for the last 6 months as reported by pt's spouse.  Pt's spouse is agreeable to SNF/Memory Care for pt.  Pt's spouse has given verbal agreement for CSW to send pt out in hub..  Pt has received 2 vaccines and 2 boosters. CSW will leave a list of SNF/Memory Care agencies at Nurses station for pt's spouse. TOC will continue to assist with discharge planning. 3: 15pm: CSW received consult from nurse's station that pt's spouse needed to speak with CSW.  Pt's spouse needed to discuss SNF/ memory care options for pt.  CSW updated pt's spouse with information on SNF/memorycare. CSW gave pt's spouse medicare.gov list with SNF with memory care. Pt's spouse gave CSW a copy of pt's covid boosters.  Pt's covid 1 & 2 vaccine are not listed on card however pt's spouse reports pt received shots at [facility] in Feb 2022. Covid vaccinations are on pt's chart.       Patient Goals and CMS Choice        Expected Discharge Plan and Services                                                Prior Living Arrangements/Services                       Activities of Daily Living      Permission Sought/Granted                  Emotional  Assessment              Admission diagnosis:  Fall [W19.XXXA] Hypertensive urgency [I16.0] Concussion without loss of consciousness, initial encounter [S06.0X0A] Contusion of right elbow, initial encounter [S50.01XA] Fall, initial encounter [W19.XXXA] Dementia without behavioral disturbance, unspecified dementia type [facility]) [F03.90] Patient Active Problem List   Diagnosis Date Noted   Concussion with no loss of consciousness    Fall 10/07/2020   Prolonged QT interval 10/07/2020   Dementia (HCC) 10/07/2020   CKD (chronic kidney disease), stage III (HCC) 10/07/2020   Hypothyroidism 10/07/2020   Head contusion 10/07/2020   Hypertensive urgency 10/07/2020   PCP:  Gweneth Dimitri, MD Pharmacy:   [id number] [place], [place] - [address] [place] [address] Phone: [phone] Fax: [phone]     Social Determinants of Health (SDOH) Interventions    Readmission Risk Interventions No flowsheet data found.

## 2020-10-08 NOTE — Progress Notes (Signed)
Pt's troponin level is 79 per lab. Pt denies any chest pain or discomforts however tries to get out of the bed and wants to leave. TRH on call X.Blount was notified. Pt was very restless, anxious and agitated. 4 nursing staffs had to come and put pt back on the bed. Pt was kicking and pushing staffs. Xanax 0.25 mg p.o was ordered and tried to give to the pt but pt spitted out the pill. Second attempt was made and pill was given with applesauce. Non-violent restraint was ordered and applied due to pt's behavior towards staff. Nursing will continue to monitor the pt.

## 2020-10-08 NOTE — NC FL2 (Signed)
[place] MEDICAID FL2 LEVEL OF CARE SCREENING TOOL     IDENTIFICATION  Patient Name: [name] Birthdate: Sep 02, 1929 Sex: female Admission Date (Current Location): 10/07/2020  [facility] and [place] Number:  Producer, television/film/video and Address:  [facility] [place]. [facility], [address], [place], [place] [address]      Provider Number: [id number]  Attending Physician Name and Address:  Rhetta Mura, MD  Relative Name and Phone Number:       Current Level of Care: Hospital Recommended Level of Care: Skilled Nursing Facility Prior Approval Number:    Date Approved/Denied: 10/08/20 PASRR Number: [id number]  Discharge Plan: SNF    Current Diagnoses: Patient Active Problem List   Diagnosis Date Noted   Concussion with no loss of consciousness    Fall 10/07/2020   Prolonged QT interval 10/07/2020   Dementia (HCC) 10/07/2020   CKD (chronic kidney disease), stage III (HCC) 10/07/2020   Hypothyroidism 10/07/2020   Head contusion 10/07/2020   Hypertensive urgency 10/07/2020    Orientation RESPIRATION BLADDER Height & Weight     Self  Normal Continent Weight: 165 lb (74.8 kg) Height:  5\' 7"  (170.2 cm)  BEHAVIORAL SYMPTOMS/MOOD NEUROLOGICAL BOWEL NUTRITION STATUS      Continent Diet (Regular)  AMBULATORY STATUS COMMUNICATION OF NEEDS Skin   Extensive Assist Verbally Normal                       Personal Care Assistance Level of Assistance  Bathing, Dressing, Feeding Bathing Assistance: Limited assistance Feeding assistance: Limited assistance Dressing Assistance: Maximum assistance     Functional Limitations Info  Sight, Hearing, Speech Sight Info: Adequate Hearing Info: Adequate Speech Info: Adequate    SPECIAL CARE FACTORS FREQUENCY  PT (By licensed PT), OT (By licensed OT)     PT Frequency: 5x weekly OT Frequency: 5x weekly            Contractures Contractures Info: Not present    Additional Factors Info  Code  Status, Allergies Code Status Info: Full Allergies Info: Elemental Sulfur, Penicillin, Cortisone, Sulfa Antibiotics           Current Medications (10/08/2020):  This is the current hospital active medication list Current Facility-Administered Medications  Medication Dose Route Frequency Provider Last Rate Last Admin   0.9 %  sodium chloride infusion   Intravenous Once 10/10/2020, Rondell A, MD       ALPRAZolam Katrinka Blazing) tablet 0.25 mg  0.25 mg Oral BID PRN Prudy Feeler, MD       enoxaparin (LOVENOX) injection 40 mg  40 mg Subcutaneous Q24H Smith, Rondell A, MD   40 mg at 10/07/20 2225   hydrALAZINE (APRESOLINE) injection 10 mg  10 mg Intravenous Q4H PRN 2226 A, MD   10 mg at 10/08/20 1142   HYDROcodone-acetaminophen (NORCO/VICODIN) 5-325 MG per tablet 0.5-1 tablet  0.5-1 tablet Oral Q6H PRN 10/10/20 A, MD   1 tablet at 10/07/20 2113   latanoprost (XALATAN) 0.005 % ophthalmic solution 1 drop  1 drop Both Eyes QHS Smith, Rondell A, MD       levothyroxine (SYNTHROID) tablet 137 mcg  137 mcg Oral q morning Smith, Rondell A, MD       LORazepam (ATIVAN) injection 0.5 mg  0.5 mg Intravenous Once 2114 T, NP       losartan (COZAAR) tablet 100 mg  100 mg Oral Daily Audrea Muscat, Rondell A, MD   100 mg at 10/07/20 2001  metoprolol succinate (TOPROL-XL) 24 hr tablet 100 mg  100 mg Oral Daily Smith, Rondell A, MD   50 mg at 10/08/20 1145   [START ON 10/09/2020] rosuvastatin (CRESTOR) tablet 10 mg  10 mg Oral Once per day on Mon Thu Smith, Rondell A, MD       sodium chloride flush (NS) 0.9 % injection 3 mL  3 mL Intravenous Q12H Madelyn Flavors A, MD   3 mL at 10/07/20 2225     Discharge Medications: Please see discharge summary for a list of discharge medications.  Relevant Imaging Results:  Relevant Lab Results:   Additional Information SSN: [id number] Patient fully vaccinated, had Pfizer x4  Inis Sizer, LCSW

## 2020-10-08 NOTE — Progress Notes (Signed)
Pt becomes combative and tries to squeeze NT's arm while providing care. Pt has been up all night with periods of getting out of the bed and screaming out. VS were unable to obtain due to pt's inability to keep still. Troponin trend down form 79-88. Pt denies any chest pain or discomforts. Will continue to monitor pt.

## 2020-10-08 NOTE — Progress Notes (Signed)
Pt tries to climb out of the bed rails with her legs. Pt would scream out when staffs tries to put her back on the bed. Frequent reorientation provided but unsuccessful.

## 2020-10-08 NOTE — Progress Notes (Signed)
PROGRESS NOTE   [name]  EXB:[id number] DOB: Aug 05, 1929 DOA: 10/07/2020 PCP: Gweneth Dimitri, MD  Brief Narrative:  87 year old white female community dwelling (note patient has 2 charts) Underlying COPD hypothyroid, LLE DVT in about 2012, known moderate to severe dementia with recent worsening of compliance with medications, resistance to taking meds Presented to ED via EMS [place] 7/23 walking in driveway found-headache large amount dried blood right side head Presented as a level 2 trauma Work-up revealed no bleeding on head CT, pelvic x-rays negative, EKG left bundle branch block ST-T wave inversions inferiorly which are new from prior one 5 years ago hypertension noted marked (256/93) BUN/creatinine 30/1.2 Elbow x-ray also negative.  Troponin secondary to demand  Started on hydrocodone pain  Hospital-Problem based course  Accidental fall She has a scalp hematoma only but is not on any blood thinners other than prophylactic pain seems uncontrolled at this time so continue Norco 0.5-1 every 6 as needed Patient not safe to discharge home per PT and will need skilled AKI on admission Continue saline 50 cc/H repeat labs a.m. Decrease ARB dose if no resolution Left lower extremity DVT 2012 History of-not on any anticoagulation at this time Hypertension Continue Toprol-XL 100 losartan 100 hydralazine as as needed Hyperthyroid?  Status post I 31 Status posttreatment I-131 now under the care of PCP- Previously seen by Dr. Everardo All Outpatient nonemergent repeat labs Severe dementia with behavioral disturbances hallucinations Diagnosed about 5 years prior, failed certain psychotropic medications per husband and refuses  othera Patient's behaviors have escalated over the past several years to the point that she becomes physically violent vs him TOC engaged for input in terms of placement Continue Ativan tablets 0.25 twice daily as needed and attempt to remove restraints/gently  redirect    DVT prophylaxis: Lovenox Code Status: Full Family Communication: Discussed with husband in detail at bedside Disposition:  Status is: Observation  The patient remains OBS appropriate and will d/c before 2 midnights.  Dispo: The patient is from: Home              Anticipated d/c is to:  Probable memory care unit versus skilled facility              Patient currently is not medically stable to d/c.   Difficult to place patient No   Consultants:  None currently  Procedures: No  Antimicrobials: No  Subjective: Pleasantly confused awake coherent no distress In restraints in upper extremities She cannot orient to time place or person but is quite pleasant She tells me she is hungry   Objective: Vitals:   10/07/20 2219 10/07/20 2220 10/08/20 0052 10/08/20 0052  BP: (!) 191/65 (!) 191/65 (!) 171/78 (!) 171/78  Pulse: 67 67 77 77  Resp: 18 18  18   Temp: 98 F (36.7 C) 98 F (36.7 C) (!) 97.4 F (36.3 C) (!) 97.4 F (36.3 C)  TempSrc: Oral Oral Axillary Oral  SpO2:  97% 98% 98%  Weight:      Height:       No intake or output data in the 24 hours ending 10/08/20 1046 Filed Weights   10/07/20 0833  Weight: 74.8 kg    Examination:  inCoherent white female no distress moderate dentition Neck soft supple S1-S2 no murmur Chest clear no added sound anterior laterally Left knee scar No edema  Data Reviewed: personally reviewed   CBC    Component Value Date/Time   WBC 11.4 (H) 10/08/2020 0014   RBC 4.74  10/08/2020 0014   HGB 13.9 10/08/2020 0014   HCT 43.4 10/08/2020 0014   PLT 225 10/08/2020 0014   MCV 91.6 10/08/2020 0014   MCH 29.3 10/08/2020 0014   MCHC 32.0 10/08/2020 0014   RDW 14.2 10/08/2020 0014   LYMPHSABS 1.7 10/07/2020 0734   MONOABS 0.6 10/07/2020 0734   EOSABS 0.1 10/07/2020 0734   BASOSABS 0.0 10/07/2020 0734   CMP Latest Ref Rng & Units 10/08/2020 10/07/2020 10/07/2020  Glucose 70 - 99 mg/dL 678(L) 381(O) 175(Z)  BUN 8 - 23  mg/dL 02(H) 85(I) 77(O)  Creatinine 0.44 - 1.00 mg/dL 2.42(P) 5.36(R) 4.43(X)  Sodium 135 - 145 mmol/L 139 141 139  Potassium 3.5 - 5.1 mmol/L 3.7 4.6 4.5  Chloride 98 - 111 mmol/L 104 104 104  CO2 22 - 32 mmol/L 22 - 26  Calcium 8.9 - 10.3 mg/dL 9.5 - 9.7  Total Protein 6.5 - 8.1 g/dL - - 7.6  Total Bilirubin 0.3 - 1.2 mg/dL - - 0.8  Alkaline Phos 38 - 126 U/L - - 62  AST 15 - 41 U/L - - 37  ALT 0 - 44 U/L - - 26     Radiology Studies: DG Elbow Complete Right  Result Date: 10/07/2020 CLINICAL DATA:  Fall.  Burning right elbow pain after fall. EXAM: RIGHT ELBOW - COMPLETE 3+ VIEW COMPARISON:  None. FINDINGS: Negative for fracture, dislocation or large joint effusion. Normal alignment of the right elbow. Subtle soft tissue lucency near the olecranon and cannot exclude a small laceration. IMPRESSION: No acute bone abnormality to the right elbow. Question a soft tissue injury or small laceration. Electronically Signed   By: Richarda Overlie M.D.   On: 10/07/2020 11:08   CT Head Wo Contrast  Result Date: 10/07/2020 CLINICAL DATA:  Penetrating head trauma. EXAM: CT HEAD WITHOUT CONTRAST CT CERVICAL SPINE WITHOUT CONTRAST TECHNIQUE: Multidetector CT imaging of the head and cervical spine was performed following the standard protocol without intravenous contrast. Multiplanar CT image reconstructions of the cervical spine were also generated. COMPARISON:  None. FINDINGS: CT HEAD FINDINGS Brain: Generalized atrophy. Chronic lacunar infarct at the left caudate nucleus. No evidence of acute infarct, hemorrhage, hydrocephalus, or mass. Vascular: No hyperdense vessel or unexpected calcification. Skull: Right posterior scalp hematoma.  No calvarial fracture Sinuses/Orbits: Bilateral cataract resection.  No visible injury. CT CERVICAL SPINE FINDINGS Alignment: No traumatic malalignment. Degenerative anterolisthesis at C3-4, C4-5, and C7-T1 Skull base and vertebrae: No acute fracture. No primary bone lesion or focal  pathologic process. Soft tissues and spinal canal: No prevertebral fluid or swelling. No visible canal hematoma. Disc levels: Diffuse degenerative disc narrowing and ridging. Multilevel facet spurring with C2-3 and C4-5 facet ankylosis. Advanced C1-2 facet osteoarthritis. Upper chest: Right apical pleural based scar like appearance. IMPRESSION: 1. No evidence of acute intracranial or cervical spine injury. 2. Scalp hematoma without calvarial fracture. Electronically Signed   By: Marnee Spring M.D.   On: 10/07/2020 08:16   CT Cervical Spine Wo Contrast  Result Date: 10/07/2020 CLINICAL DATA:  Penetrating head trauma. EXAM: CT HEAD WITHOUT CONTRAST CT CERVICAL SPINE WITHOUT CONTRAST TECHNIQUE: Multidetector CT imaging of the head and cervical spine was performed following the standard protocol without intravenous contrast. Multiplanar CT image reconstructions of the cervical spine were also generated. COMPARISON:  None. FINDINGS: CT HEAD FINDINGS Brain: Generalized atrophy. Chronic lacunar infarct at the left caudate nucleus. No evidence of acute infarct, hemorrhage, hydrocephalus, or mass. Vascular: No hyperdense vessel or unexpected calcification. Skull: Right  posterior scalp hematoma.  No calvarial fracture Sinuses/Orbits: Bilateral cataract resection.  No visible injury. CT CERVICAL SPINE FINDINGS Alignment: No traumatic malalignment. Degenerative anterolisthesis at C3-4, C4-5, and C7-T1 Skull base and vertebrae: No acute fracture. No primary bone lesion or focal pathologic process. Soft tissues and spinal canal: No prevertebral fluid or swelling. No visible canal hematoma. Disc levels: Diffuse degenerative disc narrowing and ridging. Multilevel facet spurring with C2-3 and C4-5 facet ankylosis. Advanced C1-2 facet osteoarthritis. Upper chest: Right apical pleural based scar like appearance. IMPRESSION: 1. No evidence of acute intracranial or cervical spine injury. 2. Scalp hematoma without calvarial  fracture. Electronically Signed   By: Marnee Spring M.D.   On: 10/07/2020 08:16   DG Pelvis Portable  Result Date: 10/07/2020 CLINICAL DATA:  Fall on blood thinners EXAM: PORTABLE PELVIS 1-2 VIEWS COMPARISON:  None. FINDINGS: There is no evidence of pelvic fracture or diastasis. No pelvic bone lesions are seen. Generalized osteopenia. The sacrum is largely obscured by bowel gas. IMPRESSION: Negative for fracture. Electronically Signed   By: Marnee Spring M.D.   On: 10/07/2020 08:17   DG Chest Portable 1 View  Result Date: 10/07/2020 CLINICAL DATA:  Fall on blood thinners. EXAM: PORTABLE CHEST 1 VIEW COMPARISON:  05/09/2015 FINDINGS: Borderline cardiomegaly. Mitral annular calcification/ossification. Interstitial prominence which is stable. There is no edema, consolidation, effusion, or pneumothorax. No visible fracture. IMPRESSION: No evidence of acute disease. Electronically Signed   By: Marnee Spring M.D.   On: 10/07/2020 08:19     Scheduled Meds:  enoxaparin (LOVENOX) injection  40 mg Subcutaneous Q24H   latanoprost  1 drop Both Eyes QHS   levothyroxine  137 mcg Oral q morning   LORazepam  0.5 mg Intravenous Once   losartan  100 mg Oral Daily   metoprolol succinate  100 mg Oral Daily   [START ON 10/09/2020] rosuvastatin  10 mg Oral Once per day on Mon Thu   sodium chloride flush  3 mL Intravenous Q12H   Continuous Infusions:  sodium chloride       LOS: 0 days   Time spent: 23  Rhetta Mura, MD [facility] To contact the attending provider between 7A-7P or the covering provider during after hours 7P-7A, please log into the web site [email or web address] and access using universal [place] password for that web site. If you do not have the password, please call the hospital operator.  10/08/2020, 10:46 AM

## 2020-10-08 NOTE — Plan of Care (Signed)
  Problem: Safety: Goal: Non-violent Restraint(s) Outcome: Not Progressing   Problem: Education: Goal: Knowledge of General Education information will improve Description: Including pain rating scale, medication(s)/side effects and non-pharmacologic comfort measures Outcome: Not Progressing   Problem: Health Behavior/Discharge Planning: Goal: Ability to manage health-related needs will improve Outcome: Not Progressing  Pt has impaired memory

## 2020-10-08 NOTE — Evaluation (Signed)
Physical Therapy Evaluation Patient Details Name: [name] MRN: [id number] DOB: 06-02-1929 Today's Date: 10/08/2020   History of Present Illness  87 y.o. F admitted to [facility] on 10/07/20 due to a fall where she hit her head on concrete. X-rays and CT negative for fx in extremities. Pt's prior medical history significant of hypertension, hypothyroidism, and dementia.  Clinical Impression  Pt was able to get up and move about the room with two person min/mod assist.  She remains confused    Follow Up Recommendations SNF    Equipment Recommendations  None recommended by PT    Recommendations for Other Services       Precautions / Restrictions Precautions Precautions: Fall Precaution Comments: Pt requires a lot of redirection, will use RW, however frequently lets go of it. Restrictions Weight Bearing Restrictions: No      Mobility  Bed Mobility Overal bed mobility: Needs Assistance Bed Mobility: Supine to Sit;Sit to Supine     Supine to sit: Mod assist;+2 for physical assistance;+2 for safety/equipment Sit to supine: Mod assist;+2 for physical assistance;+2 for safety/equipment   General bed mobility comments: Mod A +2, 1 person assist with trunk elevation, 1 person assists with BLE.    Transfers Overall transfer level: Needs assistance Equipment used: Rolling walker (2 wheeled);2 person hand held assist Transfers: Sit to/from [employer] Sit to Stand: Min assist;+2 physical assistance;+2 safety/equipment Stand pivot transfers: Min assist;+2 physical assistance;+2 safety/equipment       General transfer comment: Min A + 2 to power up and safety due to constant redirection needed and impulsivity.  Ambulation/Gait Ambulation/Gait assistance: Min assist;+2 safety/equipment Gait Distance (Feet): 15 Feet Assistive device: Rolling walker (2 wheeled);2 person hand held assist Gait Pattern/deviations: Step-through pattern;Antalgic     General Gait  Details: Pt has sore/antalgic gait pattern, shuffles, used RW initially, but kept letting go of it, switched to two person hand held assist for safety and redirection as well as support over weak and sore legs.  Stairs            Wheelchair Mobility    Modified Rankin (Stroke Patients Only)       Balance Overall balance assessment: Mild deficits observed, not formally tested                                           Pertinent Vitals/Pain Pain Assessment: Faces Faces Pain Scale: Hurts little more Pain Location: generalized - grimacing, repeating "dammit, dammit" as mobilizing in bed, unable to say where the pain is, however agrees that she is sore. Pain Descriptors / Indicators: Discomfort;Grimacing;Sore Pain Intervention(s): Limited activity within patient's tolerance;Monitored during session;Repositioned    Home Living Family/patient expects to be discharged to:: Private residence Living Arrangements: Spouse/significant other (husband is elderly and not sure on his feet) Available Help at Discharge: Family;Available PRN/intermittently (he goes out to the gym and store) Type of Home: House Home Access: Stairs to enter Entrance Stairs-Rails: Doctor, general practice of Steps: 2 Home Layout: One level Home Equipment: Environmental consultant - 2 wheels;Cane - single point Additional Comments: Pt is from home with husband who is frail and has his own medical issues. Husband expresses concerns of being able to continue caring for her at home, alone.    Prior Function Level of Independence: Independent;Needs assistance   Gait / Transfers Assistance Needed: Uses a cane for mobility  ADL's / Homemaking  Assistance Needed: Able to complete all ADL's with no assist, likes to help with housekeeping. Due to dementia, pt has episodes of violence/wandering/increased confusion, requiring increased assist and redirection  Comments: pt does not drive, husband drives, has son  that lives in town who occasionally cooks for them, but he has health issues and cannot assist physically with his mother.     Hand Dominance   Dominant Hand: Right    Extremity/Trunk Assessment   Upper Extremity Assessment Upper Extremity Assessment: Defer to OT evaluation    Lower Extremity Assessment Lower Extremity Assessment: Generalized weakness    Cervical / Trunk Assessment Cervical / Trunk Assessment: Kyphotic  Communication   Communication: No difficulties  Cognition Arousal/Alertness: Awake/alert (once aroused and EOB) Behavior During Therapy: Restless;Impulsive Overall Cognitive Status: History of cognitive impairments - at baseline (seems worse than baseline)                                 General Comments: Pt has baseline of dementia, which appears to be progressing      General Comments General comments (skin integrity, edema, etc.): VSS, easily distracted, likes to pull at things that hang down, including hair and name badges    Exercises     Assessment/Plan    PT Assessment Patient needs continued PT services  PT Problem List Decreased strength;Decreased activity tolerance;Decreased balance;Decreased mobility;Decreased cognition;Decreased knowledge of use of DME;Decreased safety awareness;Decreased knowledge of precautions;Pain       PT Treatment Interventions DME instruction;Stair training;Gait training;Functional mobility training;Therapeutic activities;Therapeutic exercise;Balance training;Patient/family education;Modalities    PT Goals (Current goals can be found in the Care Plan section)  Acute Rehab PT Goals Patient Stated Goal: Husband reports "I want her to get stronger and be safe" PT Goal Formulation: With family Time For Goal Achievement: 10/22/20 Potential to Achieve Goals: Good    Frequency Min 2X/week   Barriers to discharge Decreased caregiver support husband is having a hard time taking care of her as her dementia  progresses.    Co-evaluation PT/OT/SLP Co-Evaluation/Treatment: Yes Reason for Co-Treatment: For patient/therapist safety (due to advanced dementia) PT goals addressed during session: Mobility/safety with mobility;Balance;Proper use of DME         AM-PAC PT "6 Clicks" Mobility  Outcome Measure Help needed turning from your back to your side while in a flat bed without using bedrails?: A Lot Help needed moving from lying on your back to sitting on the side of a flat bed without using bedrails?: A Lot Help needed moving to and from a bed to a chair (including a wheelchair)?: A Little Help needed standing up from a chair using your arms (e.g., wheelchair or bedside chair)?: A Little Help needed to walk in hospital room?: A Little Help needed climbing 3-5 steps with a railing? : A Lot 6 Click Score: 15    End of Session   Activity Tolerance: Patient limited by pain;Other (comment) (limited by confusion) Patient left: in bed;with call bell/phone within reach;with bed alarm set;with family/visitor present;with restraints reapplied;with nursing/sitter in room Nurse Communication: Mobility status PT Visit Diagnosis: Muscle weakness (generalized) (M62.81);Difficulty in walking, not elsewhere classified (R26.2);Pain Pain - Right/Left:  (generlized) Pain - part of body:  (generalized)    Time: 8101-7510 PT Time Calculation (min) (ACUTE ONLY): 35 min   Charges:   PT Evaluation $PT Eval Moderate Complexity: 1 Mod         Corinna Capra, PT, DPT  Acute Rehabilitation Ortho Tech Supervisor [phone] pager [phone]) [phone] office

## 2020-10-08 NOTE — Hospital Course (Signed)
87 year old white female community dwelling (note patient has 2 charts) Underlying COPD hypothyroid, LLE DVT in about 2012, known moderate to severe dementia with recent worsening of compliance with medications, resistance to taking meds Presented to ED via [facility] 7/23 walking in driveway found-headache large amount dried blood right side head Presented as a level 2 trauma Work-up revealed no bleeding on head CT, pelvic x-rays negative, EKG left bundle branch block ST-T wave inversions inferiorly which are new from prior one 5 years ago hypertension noted marked (256/93) BUN/creatinine 30/1.2 Elbow x-ray also negative.  Troponin secondary to demand  Started on hydrocodone pain

## 2020-10-08 NOTE — Evaluation (Signed)
Occupational Therapy Evaluation Patient Details Name: [name] MRN: [id number] DOB: [birth year] Today's Date: 10/08/2020    History of Present Illness 87 y.o. F admitted to [facility] on 10/07/20 due to a fall where she hit her head on concrete. Pt's prior medical history significant of hypertension, hypothyroidism, and dementia.   Clinical Impression   Pt admitted for concerns listed above. PTA pt's husband reported that she was independent ambulating with a cane and completing ADL's. Additionally, pt would assist with housekeeping. At this time, pt presents with increased weakness, requiring min A +2 for OOB mobility, Mod A +2 for bed mobility, and min-max A +2 for ADL's. Pt also requires increased cueing verbally and tactility due to impulsivity, difficulty initiating and follow commands.  Pt will benefit from skilled therapy at a SNF to maximize her ability to improve her ADL performance and functional mobility. OT will follow acutely.    Follow Up Recommendations  SNF;Supervision/Assistance - 24 hour    Equipment Recommendations  None recommended by OT    Recommendations for Other Services       Precautions / Restrictions Precautions Precautions: Fall Precaution Comments: Pt requires a lot of redirection, will use RW, however frequently lets go of it. Restrictions Weight Bearing Restrictions: No      Mobility Bed Mobility Overal bed mobility: Needs Assistance Bed Mobility: Supine to Sit;Sit to Supine     Supine to sit: Mod assist;+2 for physical assistance;+2 for safety/equipment Sit to supine: Mod assist;+2 for physical assistance;+2 for safety/equipment   General bed mobility comments: Mod A +2, 1 person assist with trunk elevation, 1 person assists with BLE.    Transfers Overall transfer level: Needs assistance Equipment used: Rolling walker (2 wheeled);2 person hand held assist Transfers: Sit to/from [employer] Sit to Stand: Min assist;+2  physical assistance;+2 safety/equipment Stand pivot transfers: Min assist;+2 physical assistance;+2 safety/equipment       General transfer comment: Min A + 2 to power up and safety due to constant redirection needed and impulsivity.    Balance Overall balance assessment: Mild deficits observed, not formally tested                                         ADL either performed or assessed with clinical judgement   ADL Overall ADL's : Needs assistance/impaired Eating/Feeding: Set up;Sitting   Grooming: Minimal assistance;Sitting;Cueing for sequencing   Upper Body Bathing: Minimal assistance;Sitting;Cueing for sequencing   Lower Body Bathing: Maximal assistance;+2 for safety/equipment;Cueing for sequencing;Sit to/from stand;Sitting/lateral leans   Upper Body Dressing : Minimal assistance;Cueing for sequencing;Sitting   Lower Body Dressing: Maximal assistance;+2 for safety/equipment;Cueing for sequencing;Sitting/lateral leans;Sit to/from stand   Toilet Transfer: Minimal assistance;+2 for safety/equipment;+2 for physical assistance;Stand-pivot;Ambulation   Toileting- Clothing Manipulation and Hygiene: Maximal assistance;+2 for safety/equipment;Cueing for sequencing;Sitting/lateral lean;Sit to/from stand   Tub/ Shower Transfer: Minimal assistance;+2 for physical assistance;+2 for safety/equipment;Stand-pivot;Ambulation   Functional mobility during ADLs: Minimal assistance;+2 for safety/equipment;Rolling walker General ADL Comments: Pt requires constant redirection and cueing for initiation/sequencing of tasks. Pt reports "I don't want to do that" when asked about completing grooming/hygiene tasks.     Vision   Vision Assessment?: No apparent visual deficits     Perception Perception Perception Tested?: No   Praxis Praxis Praxis tested?: Not tested    Pertinent Vitals/Pain Pain Assessment: Faces Faces Pain Scale: Hurts little more Pain Location: generalized  - grimacing,  repeating "dammit, dammit" as mobilizing in bed, unable to say where the pain is, however agrees that she is sore. Pain Descriptors / Indicators: Discomfort;Grimacing;Sore Pain Intervention(s): Monitored during session;Repositioned     Hand Dominance Right   Extremity/Trunk Assessment Upper Extremity Assessment Upper Extremity Assessment: Overall WFL for tasks assessed   Lower Extremity Assessment Lower Extremity Assessment: Defer to PT evaluation   Cervical / Trunk Assessment Cervical / Trunk Assessment: Kyphotic   Communication Communication Communication: No difficulties   Cognition Arousal/Alertness: Awake/alert Behavior During Therapy: Restless;Impulsive Overall Cognitive Status: History of cognitive impairments - at baseline                                 General Comments: Pt has baseline of dementia, which appears to be progressing   General Comments  VSS, easily distracted, likes to pull at things that hang down, including hair and name badges    Exercises     Shoulder Instructions      Home Living Family/patient expects to be discharged to:: Skilled nursing facility                                 Additional Comments: Pt is from home with husband who is frail and has his own medical issues. Husband expresses concerns of being able to continue caring for her at home, alone.      Prior Functioning/Environment Level of Independence: Independent;Needs assistance  Gait / Transfers Assistance Needed: Uses a cane for mobility ADL's / Homemaking Assistance Needed: Able to complete all ADL's with no assist, likes to help with housekeeping. Due to dementia, pt has episodes of violence/wandering/increased confusion, requiring increased assist and redirection            OT Problem List: Decreased strength;Decreased activity tolerance;Impaired balance (sitting and/or standing);Decreased coordination;Decreased cognition;Decreased  safety awareness;Decreased knowledge of use of DME or AE      OT Treatment/Interventions: Self-care/ADL training;Therapeutic exercise;Energy conservation;DME and/or AE instruction;Therapeutic activities;Cognitive remediation/compensation;Patient/family education;Balance training    OT Goals(Current goals can be found in the care plan section) Acute Rehab OT Goals Patient Stated Goal: Husband reports "I want her to get stronger and be safe" OT Goal Formulation: With patient/family Time For Goal Achievement: 10/22/20 Potential to Achieve Goals: Fair ADL Goals Pt Will Perform Grooming: with supervision;standing Pt Will Perform Upper Body Dressing: with modified independence;sitting Pt Will Perform Lower Body Dressing: with supervision;sitting/lateral leans;sit to/from stand Pt Will Transfer to Toilet: with supervision;ambulating Pt Will Perform Toileting - Clothing Manipulation and hygiene: with supervision;sitting/lateral leans;sit to/from stand  OT Frequency: Min 2X/week   Barriers to D/C:    Pt is weak and has balance deficits, husband is caregiver and between his health problems and her health problems and violent episode due to dementia, pt not safe to return home.       Co-evaluation              AM-PAC OT "6 Clicks" Daily Activity     Outcome Measure Help from another person eating meals?: A Little Help from another person taking care of personal grooming?: A Little Help from another person toileting, which includes using toliet, bedpan, or urinal?: A Lot Help from another person bathing (including washing, rinsing, drying)?: A Lot Help from another person to put on and taking off regular upper body clothing?: A Little Help from another person to put  on and taking off regular lower body clothing?: A Lot 6 Click Score: 15   End of Session Equipment Utilized During Treatment: Engineer, water Communication: Mobility status  Activity Tolerance: Patient tolerated  treatment well Patient left: in bed;with call bell/phone within reach;with bed alarm set;with nursing/sitter in room;with restraints reapplied  OT Visit Diagnosis: Unsteadiness on feet (R26.81);Other abnormalities of gait and mobility (R26.89);Muscle weakness (generalized) (M62.81)                Time: 1884-1660 OT Time Calculation (min): 35 min Charges:  OT General Charges $OT Visit: 1 Visit OT Evaluation $OT Eval Moderate Complexity: 1 Mod  Brion Hedges H., OTR/L Acute Rehabilitation  Kolby Schara Elane Kortney Potvin 10/08/2020, 10:30 AM

## 2020-10-09 ENCOUNTER — Inpatient Hospital Stay (HOSPITAL_COMMUNITY): Payer: Medicare Other

## 2020-10-09 ENCOUNTER — Encounter (HOSPITAL_COMMUNITY): Payer: Medicare Other

## 2020-10-09 DIAGNOSIS — M7989 Other specified soft tissue disorders: Secondary | ICD-10-CM | POA: Diagnosis not present

## 2020-10-09 DIAGNOSIS — N1831 Chronic kidney disease, stage 3a: Secondary | ICD-10-CM

## 2020-10-09 DIAGNOSIS — M79606 Pain in leg, unspecified: Secondary | ICD-10-CM

## 2020-10-09 DIAGNOSIS — W19XXXA Unspecified fall, initial encounter: Secondary | ICD-10-CM

## 2020-10-09 LAB — COMPREHENSIVE METABOLIC PANEL
ALT: 29 U/L (ref 0–44)
AST: 73 U/L — ABNORMAL HIGH (ref 15–41)
Albumin: 3.4 g/dL — ABNORMAL LOW (ref 3.5–5.0)
Alkaline Phosphatase: 56 U/L (ref 38–126)
Anion gap: 14 (ref 5–15)
BUN: 19 mg/dL (ref 8–23)
CO2: 24 mmol/L (ref 22–32)
Calcium: 9.3 mg/dL (ref 8.9–10.3)
Chloride: 103 mmol/L (ref 98–111)
Creatinine, Ser: 0.99 mg/dL (ref 0.44–1.00)
GFR, Estimated: 55 mL/min — ABNORMAL LOW (ref >60)
Glucose, Bld: 113 mg/dL — ABNORMAL HIGH (ref 70–99)
Potassium: 3.4 mmol/L — ABNORMAL LOW (ref 3.5–5.1)
Sodium: 141 mmol/L (ref 135–145)
Total Bilirubin: 1.6 mg/dL — ABNORMAL HIGH (ref 0.3–1.2)
Total Protein: 6.6 g/dL (ref 6.5–8.1)

## 2020-10-09 LAB — CBC WITH DIFFERENTIAL/PLATELET
Abs Immature Granulocytes: 0.04 10*3/uL (ref 0.00–0.07)
Basophils Absolute: 0 10*3/uL (ref 0.0–0.1)
Basophils Relative: 0 %
Eosinophils Absolute: 0.1 10*3/uL (ref 0.0–0.5)
Eosinophils Relative: 1 %
HCT: 44.6 % (ref 36.0–46.0)
Hemoglobin: 14.4 g/dL (ref 12.0–15.0)
Immature Granulocytes: 0 %
Lymphocytes Relative: 7 %
Lymphs Abs: 0.8 10*3/uL (ref 0.7–4.0)
MCH: 29.4 pg (ref 26.0–34.0)
MCHC: 32.3 g/dL (ref 30.0–36.0)
MCV: 91 fL (ref 80.0–100.0)
Monocytes Absolute: 1 10*3/uL (ref 0.1–1.0)
Monocytes Relative: 8 %
Neutro Abs: 9.9 10*3/uL — ABNORMAL HIGH (ref 1.7–7.7)
Neutrophils Relative %: 84 %
Platelets: 205 10*3/uL (ref 150–400)
RBC: 4.9 MIL/uL (ref 3.87–5.11)
RDW: 14.1 % (ref 11.5–15.5)
WBC: 11.9 10*3/uL — ABNORMAL HIGH (ref 4.0–10.5)
nRBC: 0 % (ref 0.0–0.2)

## 2020-10-09 LAB — T4, FREE: Free T4: 1.08 ng/dL (ref 0.61–1.12)

## 2020-10-09 MED ORDER — POTASSIUM CHLORIDE CRYS ER 20 MEQ PO TBCR
40.0000 meq | EXTENDED_RELEASE_TABLET | Freq: Once | ORAL | Status: AC
  Administered 2020-10-09: 40 meq via ORAL
  Filled 2020-10-09: qty 2

## 2020-10-09 MED ORDER — ZOLPIDEM TARTRATE 5 MG PO TABS
5.0000 mg | ORAL_TABLET | Freq: Every evening | ORAL | Status: DC | PRN
  Administered 2020-10-09: 5 mg via ORAL
  Filled 2020-10-09: qty 1

## 2020-10-09 MED ORDER — ACETAMINOPHEN 650 MG RE SUPP: 650.0000 mg | Freq: Four times a day (QID) | RECTAL | Status: DC | PRN

## 2020-10-09 MED ORDER — LACTATED RINGERS IV SOLN: INTRAVENOUS | Status: DC

## 2020-10-09 MED ORDER — ACETAMINOPHEN 325 MG PO TABS: 650.0000 mg | ORAL_TABLET | Freq: Four times a day (QID) | ORAL | Status: DC | PRN

## 2020-10-09 MED ORDER — DOCUSATE SODIUM 100 MG PO CAPS
100.0000 mg | ORAL_CAPSULE | Freq: Two times a day (BID) | ORAL | Status: DC
  Administered 2020-10-09 – 2020-10-12 (×7): 100 mg via ORAL
  Filled 2020-10-09 (×7): qty 1

## 2020-10-09 MED ORDER — CLONIDINE HCL 0.1 MG/24HR TD PTWK
0.1000 mg | MEDICATED_PATCH | TRANSDERMAL | Status: DC
  Administered 2020-10-09: 0.1 mg via TRANSDERMAL
  Filled 2020-10-09: qty 1

## 2020-10-09 NOTE — Progress Notes (Addendum)
[facility] Progress Note  Patient: [name]    GEZ:[id number]  DOA: 10/07/2020     Date of Service: the patient was seen and examined on 10/09/2020  Brief hospital course: Past medical history of HTN, hypothyroidism, dementia.  Presents with a fall, unwitnessed. Work-up so far unremarkable for any acute fracture.  Also had hypertensive urgency currently being corrected. Currently plan is to look for safe discharge plan.  Subjective: Confused.  Per RN patient has significant hallucination.  No nausea no vomiting.  No fever no chills.  Minimal oral intake.  No diarrhea.  Assessment and Plan: 1.  Unwitnessed fall at home. Head contusion. CT head and C-spine negative for any acute fracture or intracranial abnormality. Does not appear to have any focal deficit. Has mild scalp hematoma. X-ray elbow, pelvis as well as x-ray chest negative for any acute fracture as well. PT OT recommend SNF. Continue pain control for now.  2.  Hypertensive urgency Blood pressure significantly elevated on admission. On losartan 100 mg as well as Toprol 100 mg daily. Currently will add clonidine as well.  3.  Dehydration CKD 3a with mild renal insufficiency Minimal oral intake. Continue with IV hydration.  Monitor. On presentation serum creatinine 1.26, BUN 30.  Currently improving but clinically appears to be dehydrated.  Will monitor response.  4.  Hypothyroidism Low TSH and free T4 normal. Continue current Synthroid dose.  5.  Dementia with behavioral issues Has visual hallucination. Anxious and tearful at her baseline. Currently has restraints. Unfortunately occasionally becomes physically violent to the hospital although no violence in the hospital. On Ativan as needed.  6. Bilateral edema Doppler negative for DVT.  7.  LBBB. Prolonged QT. EKG shows evidence of LBBB.  Chronic, present in 2017 as well. Due to this.  QTC is incorrectly calculated as prolonged. Currently QTC  based on the Bazett calculation 495 ms.  Still prolonged.  Monitor.  Scheduled Meds:  cloNIDine  0.1 mg Transdermal Weekly   docusate sodium  100 mg Oral BID   enoxaparin (LOVENOX) injection  40 mg Subcutaneous Q24H   latanoprost  1 drop Both Eyes QHS   levothyroxine  137 mcg Oral q morning   losartan  100 mg Oral Daily   metoprolol succinate  100 mg Oral Daily   sodium chloride flush  3 mL Intravenous Q12H   Continuous Infusions:  lactated ringers     PRN Meds: acetaminophen **OR** acetaminophen, ALPRAZolam, hydrALAZINE, HYDROcodone-acetaminophen, zolpidem  Body mass index is 25.84 kg/m.        DVT Prophylaxis:   enoxaparin (LOVENOX) injection 40 mg Start: 10/07/20 2200 SCDs Start: 10/07/20 1404    Advance goals of care discussion: Pt is Full code.  Family Communication: no family was present at bedside, at the time of interview.  Data Reviewed: I have personally reviewed and interpreted daily labs, tele strips, imaging. Potassium 3.4.  AST mildly elevated with hemoglobin stable.  Physical Exam:  General: Appear in mild distress, no Rash; Oral Mucosa Clear, moist. no Abnormal Neck Mass Or lumps, Conjunctiva normal  Cardiovascular: S1 and S2 Present, no Murmur, Respiratory: good respiratory effort, Bilateral Air entry present and CTA, no Crackles, no wheezes Abdomen: Bowel Sound present, Soft and no tenderness Extremities: no Pedal edema Neurology: alert and oriented to time, place, and person affect appropriate. no new focal deficit Gait not checked due to patient safety concerns   Vitals:   10/09/20 0810 10/09/20 0938 10/09/20 1010 10/09/20 1215  BP: (!) 212/88 Marland Kitchen)  164/111 (!) 163/80 (!) 151/86  Pulse: 73 73 76 (!) 119  Resp: 19  18 19   Temp: 99.9 F (37.7 C)  97.6 F (36.4 C) 99.3 F (37.4 C)  TempSrc: Oral  Oral Oral  SpO2: 100%  98% 96%  Weight:      Height:        Disposition:  Status is: Inpatient  Remains inpatient appropriate because:Altered  mental status and Unsafe d/c plan  Dispo: The patient is from: Home              Anticipated d/c is to: SNF              Patient currently is not medically stable to d/c.   Difficult to place patient No  Time spent: 35 minutes. I reviewed all nursing notes, pharmacy notes, vitals, pertinent old records. I have discussed plan of care as described above with RN.  Author: , MD [facility] 10/09/2020 4:05 PM  To reach On-call, see care teams to locate the attending and reach out via [email or web address]. Between 7PM-7AM, please contact night-coverage If you still have difficulty reaching the attending provider, please page the [facility] (Director on Call) for [facility] on amion for assistance.

## 2020-10-09 NOTE — Progress Notes (Signed)
Bilateral lower venous study  has been completed. Refer to [facility] under chart review to view preliminary results.   10/09/2020  3:28 PM [name], [name]

## 2020-10-09 NOTE — TOC CAGE-AID Note (Signed)
Transition of Care [facility]) - CAGE-AID Screening   Patient Details  Name: [name] MRN: [id number] Date of Birth: [birth year]  Transition of Care [facility]) CM/SW Contact:    Immaculate Crutcher C Tarpley-Carter, LCSWA Phone Number: 10/09/2020, 9:51 AM   Clinical Narrative: Pt is unable to participate in Cage Aid. Pt is inappropriate for assessment.  Arpan Eskelson Tarpley-Carter, MSW, LCSW-A Pronouns:  She/Her/Hers [facility] HealthTransitions of Care Clinical Social Worker Direct Number:  [phone] [email or web address]   CAGE-AID Screening: Substance Abuse Screening unable to be completed due to: : Patient unable to participate ((Pt is inappropriate for assessment.))

## 2020-10-09 NOTE — Plan of Care (Signed)
  Problem: Safety: Goal: Non-violent Restraint(s) Outcome: Progressing   Problem: Activity: Goal: Risk for activity intolerance will decrease Outcome: Progressing   Problem: Pain Managment: Goal: General experience of comfort will improve Outcome: Progressing   Problem: Safety: Goal: Ability to remain free from injury will improve Outcome: Progressing   Problem: Skin Integrity: Goal: Risk for impaired skin integrity will decrease Outcome: Progressing   

## 2020-10-09 NOTE — Plan of Care (Signed)
  Problem: Safety: Goal: Non-violent Restraint(s) Outcome: Progressing   Problem: Clinical Measurements: Goal: Will remain free from infection Outcome: Progressing   Problem: Clinical Measurements: Goal: Ability to maintain clinical measurements within normal limits will improve Outcome: Progressing Goal: Will remain free from infection Outcome: Progressing Goal: Diagnostic test results will improve Outcome: Progressing Goal: Respiratory complications will improve Outcome: Progressing Goal: Cardiovascular complication will be avoided Outcome: Progressing   Problem: Nutrition: Goal: Adequate nutrition will be maintained Outcome: Progressing   Problem: Coping: Goal: Level of anxiety will decrease Outcome: Progressing   Problem: Safety: Goal: Ability to remain free from injury will improve Outcome: Progressing   Problem: Skin Integrity: Goal: Risk for impaired skin integrity will decrease Outcome: Progressing   Problem: Pain Managment: Goal: General experience of comfort will improve Outcome: Progressing

## 2020-10-10 LAB — BASIC METABOLIC PANEL
Anion gap: 11 (ref 5–15)
BUN: 31 mg/dL — ABNORMAL HIGH (ref 8–23)
CO2: 23 mmol/L (ref 22–32)
Calcium: 9.1 mg/dL (ref 8.9–10.3)
Chloride: 106 mmol/L (ref 98–111)
Creatinine, Ser: 1.23 mg/dL — ABNORMAL HIGH (ref 0.44–1.00)
GFR, Estimated: 42 mL/min — ABNORMAL LOW (ref >60)
Glucose, Bld: 99 mg/dL (ref 70–99)
Potassium: 3.9 mmol/L (ref 3.5–5.1)
Sodium: 140 mmol/L (ref 135–145)

## 2020-10-10 LAB — CBC
HCT: 44.9 % (ref 36.0–46.0)
Hemoglobin: 14.8 g/dL (ref 12.0–15.0)
MCH: 30 pg (ref 26.0–34.0)
MCHC: 33 g/dL (ref 30.0–36.0)
MCV: 90.9 fL (ref 80.0–100.0)
Platelets: 227 10*3/uL (ref 150–400)
RBC: 4.94 MIL/uL (ref 3.87–5.11)
RDW: 14.1 % (ref 11.5–15.5)
WBC: 16.4 10*3/uL — ABNORMAL HIGH (ref 4.0–10.5)
nRBC: 0 % (ref 0.0–0.2)

## 2020-10-10 LAB — MAGNESIUM: Magnesium: 1.8 mg/dL (ref 1.7–2.4)

## 2020-10-10 MED ORDER — AMLODIPINE BESYLATE 5 MG PO TABS
5.0000 mg | ORAL_TABLET | Freq: Every day | ORAL | Status: DC
  Administered 2020-10-10: 5 mg via ORAL
  Filled 2020-10-10: qty 1

## 2020-10-10 NOTE — Progress Notes (Signed)
[facility] Progress Note  Patient: [name]    PNT:[id number]  DOA: 10/07/2020     Date of Service: the patient was seen and examined on 10/10/2020  Brief hospital course: Past medical history of HTN, hypothyroidism, dementia.  Presents with a fall, unwitnessed. Work-up so far unremarkable for any acute fracture.  Also had hypertensive urgency currently being corrected. Currently plan is to look for safe discharge plan.  Subjective: Confused.  While more appropriate during the daytime.  Tells me that she actually has a daughter and parents at home for which she has to go home.  No nausea no vomiting.  No diarrhea.  Oral intake improving.  Assessment and Plan: 1.  Unwitnessed fall at home. Head contusion. CT head and C-spine negative for any acute fracture or intracranial abnormality. Does not appear to have any focal deficit. Has mild scalp hematoma. X-ray elbow, pelvis as well as x-ray chest negative for any acute fracture as well. PT OT recommend SNF. Continue pain control for now.  2.  Hypertensive urgency Blood pressure significantly elevated on admission. On losartan 100 mg as well as Toprol 100 mg daily. Currently will add clonidine as well. Norvasc was added about due to rapid correction and the blood pressure currently discontinued.  3.  Dehydration AKI with CKD 3A. Minimal oral intake.  At risk for further worsening tension currently receiving IV hydration. Will monitor response.  4.  Hypothyroidism Low TSH and free T4 normal. Continue current Synthroid dose.  5.  Dementia with behavioral issues Has visual hallucination. Anxious and tearful at her baseline. Currently has restraints. Unfortunately occasionally becomes physically violent to the hospital although no violence in the hospital. On Ativan as needed.  6. Bilateral edema Doppler negative for DVT.  7.  LBBB. Prolonged QT. EKG shows evidence of LBBB.  Chronic, present in 2017 as  well. Due to this.  QTC is incorrectly calculated as prolonged. Currently QTC based on the Bazett calculation 495 ms.  Still prolonged.  Monitor.  8.  Leukocytosis. Likely from dehydration. Will monitor.  Scheduled Meds:  cloNIDine  0.1 mg Transdermal Weekly   docusate sodium  100 mg Oral BID   enoxaparin (LOVENOX) injection  40 mg Subcutaneous Q24H   latanoprost  1 drop Both Eyes QHS   levothyroxine  137 mcg Oral q morning   metoprolol succinate  100 mg Oral Daily   sodium chloride flush  3 mL Intravenous Q12H   Continuous Infusions:  lactated ringers 75 mL/hr at 10/10/20 0844   PRN Meds: acetaminophen **OR** acetaminophen, ALPRAZolam, hydrALAZINE, HYDROcodone-acetaminophen, zolpidem  Body mass index is 25.84 kg/m.        DVT Prophylaxis:   enoxaparin (LOVENOX) injection 40 mg Start: 10/07/20 2200 SCDs Start: 10/07/20 1404    Advance goals of care discussion: Pt is Full code.  Family Communication: no family was present at bedside, at the time of interview.  Unable to reach the family on phone.  Data Reviewed: I have personally reviewed and interpreted daily labs, tele strips, imaging. Potassium 3.4.  Serum creatinine 1.23.  WBC 16.4.  Physical Exam:  General: Appear in mild distress, no Rash; Oral Mucosa Clear, moist. no Abnormal Neck Mass Or lumps, Conjunctiva normal  Cardiovascular: S1 and S2 Present, no Murmur, Respiratory: good respiratory effort, Bilateral Air entry present and CTA, no Crackles, no wheezes Abdomen: Bowel Sound present, Soft and no tenderness Extremities: no Pedal edema Neurology: alert and pleasantly confused, not oriented to time, place, and person affect appropriate.  no new focal deficit Gait not checked due to patient safety concerns  Vitals:   10/10/20 0915 10/10/20 1000 10/10/20 1115 10/10/20 1500  BP: (!) 163/88  (!) 122/57 (!) 166/63  Pulse: 83  64 66  Resp:   18 17  Temp: 97.8 F (36.6 C) 97.8 F (36.6 C) 98.1 F (36.7 C) 98.9  F (37.2 C)  TempSrc: Oral  Oral Oral  SpO2:   95% 98%  Weight:      Height:        Disposition:  Status is: Inpatient  Remains inpatient appropriate because:Altered mental status and Unsafe d/c plan  Dispo: The patient is from: Home              Anticipated d/c is to: SNF              Patient currently is not medically stable to d/c.   Difficult to place patient No  Time spent: 35 minutes. I reviewed all nursing notes, pharmacy notes, vitals, pertinent old records. I have discussed plan of care as described above with RN.  Author: Lynden Oxford, MD [facility] 10/10/2020 6:04 PM  To reach On-call, see care teams to locate the attending and reach out via [email or web address]. Between 7PM-7AM, please contact night-coverage If you still have difficulty reaching the attending provider, please page the [facility] (Director on Call) for [facility] on amion for assistance.

## 2020-10-10 NOTE — Plan of Care (Addendum)
Restraints removed from patient. Patient removed IV on nightshift. New IV placed and patient placed in mittens due to pulling out previous IV.   Problem: Education: Goal: Knowledge of General Education information will improve Description: Including pain rating scale, medication(s)/side effects and non-pharmacologic comfort measures Outcome: Progressing   Problem: Activity: Goal: Risk for activity intolerance will decrease Outcome: Progressing   Problem: Pain Managment: Goal: General experience of comfort will improve Outcome: Progressing   Problem: Safety: Goal: Ability to remain free from injury will improve Outcome: Progressing   Problem: Skin Integrity: Goal: Risk for impaired skin integrity will decrease Outcome: Progressing

## 2020-10-10 NOTE — Plan of Care (Signed)
Pt. Was able to ambulate in room today and was actively much more responsive today and we were able to dc the restraints.   Problem: Education: Goal: Knowledge of General Education information will improve Description: Including pain rating scale, medication(s)/side effects and non-pharmacologic comfort measures Outcome: Progressing   Problem: Health Behavior/Discharge Planning: Goal: Ability to manage health-related needs will improve Outcome: Progressing   Problem: Activity: Goal: Risk for activity intolerance will decrease Outcome: Progressing   Problem: Nutrition: Goal: Adequate nutrition will be maintained Outcome: Progressing   Problem: Coping: Goal: Level of anxiety will decrease Outcome: Progressing   Problem: Safety: Goal: Ability to remain free from injury will improve Outcome: Progressing   Problem: Skin Integrity: Goal: Risk for impaired skin integrity will decrease Outcome: Progressing

## 2020-10-10 NOTE — Plan of Care (Signed)

## 2020-10-10 NOTE — Progress Notes (Signed)
Physical Therapy Treatment Patient Details Name: [name] MRN: [id number] DOB: January 15, 1930 Today's Date: 10/10/2020    History of Present Illness 87 y.o. F admitted to [facility] on 10/07/20 due to a fall where she hit her head on concrete. X-rays and CT negative for fx in extremities. Pt's prior medical history significant of hypertension, hypothyroidism, and dementia.    PT Comments    Pt supine in bed.  Found to be soiled with bowel and bladder incontinence.  Pt required min to mod assistance to mobilize.  Performed short bout of gait training in room.  Continue to recommend snf placement at d/c.      Follow Up Recommendations  SNF     Equipment Recommendations  None recommended by PT    Recommendations for Other Services       Precautions / Restrictions Precautions Precautions: Fall Precaution Comments: Pt requires a lot of redirection, will use RW, however frequently lets go of it. Restrictions Weight Bearing Restrictions: No    Mobility  Bed Mobility Overal bed mobility: Needs Assistance Bed Mobility: Supine to Sit;Rolling     Supine to sit: Min assist Sit to supine: Mod assist   General bed mobility comments: Min assistance to roll to R and L side for pericare.  Pt required assistance to elevate trunk into sitting.    Transfers Overall transfer level: Needs assistance Equipment used: Rolling walker (2 wheeled) Transfers: Sit to/from Stand Sit to Stand: Mod assist         General transfer comment: Cues for hand placement and assistance to rise into standing.  Pt lifting RW in standing which causes her to be unsteady, required cues to ground RW to improve stability.  Ambulation/Gait Ambulation/Gait assistance: Min assist Gait Distance (Feet): 12 Feet Assistive device: Rolling walker (2 wheeled) Gait Pattern/deviations: Step-through pattern;Trunk flexed;Decreased stride length     General Gait Details: Pt able to follow directions to progress steps  from bed to recliner across room.  No complaints of pain.  Mildly unsteady.   Stairs             Wheelchair Mobility    Modified Rankin (Stroke Patients Only)       Balance Overall balance assessment: Mild deficits observed, not formally tested;Needs assistance   Sitting balance-Leahy Scale: Fair       Standing balance-Leahy Scale: Poor                              Cognition Arousal/Alertness: Awake/alert Behavior During Therapy: WFL for tasks assessed/performed Overall Cognitive Status: History of cognitive impairments - at baseline                                 General Comments: Pt has baseline of dementia, which appears to be progressing. very pleasant today.      Exercises      General Comments        Pertinent Vitals/Pain Pain Assessment: No/denies pain    Home Living                      Prior Function            PT Goals (current goals can now be found in the care plan section) Acute Rehab PT Goals Patient Stated Goal: to go home to my husband Potential to Achieve Goals: Good Progress towards PT goals:  Progressing toward goals    Frequency    Min 2X/week      PT Plan Current plan remains appropriate    Co-evaluation              AM-PAC PT "6 Clicks" Mobility   Outcome Measure  Help needed turning from your back to your side while in a flat bed without using bedrails?: A Little Help needed moving from lying on your back to sitting on the side of a flat bed without using bedrails?: A Lot Help needed moving to and from a bed to a chair (including a wheelchair)?: A Lot Help needed standing up from a chair using your arms (e.g., wheelchair or bedside chair)?: A Lot Help needed to walk in hospital room?: A Little Help needed climbing 3-5 steps with a railing? : A Lot 6 Click Score: 14    End of Session Equipment Utilized During Treatment: Gait belt Activity Tolerance: Patient tolerated  treatment well Patient left: in chair;with call bell/phone within reach;with chair alarm set (with sitter alarm belt.) Nurse Communication: Mobility status PT Visit Diagnosis: Muscle weakness (generalized) (M62.81);Difficulty in walking, not elsewhere classified (R26.2);Pain Pain - Right/Left:  (generalized)     Time: 1203-1230 PT Time Calculation (min) (ACUTE ONLY): 27 min  Charges:  $Gait Training: 8-22 mins $Therapeutic Activity: 8-22 mins                     Bonney Leitz , PTA Acute Rehabilitation Services Pager [phone] Office [phone]    Dearis Danis Artis Delay 10/10/2020, 12:49 PM

## 2020-10-11 LAB — CBC
HCT: 40.8 % (ref 36.0–46.0)
Hemoglobin: 13.3 g/dL (ref 12.0–15.0)
MCH: 29.9 pg (ref 26.0–34.0)
MCHC: 32.6 g/dL (ref 30.0–36.0)
MCV: 91.7 fL (ref 80.0–100.0)
Platelets: 197 10*3/uL (ref 150–400)
RBC: 4.45 MIL/uL (ref 3.87–5.11)
RDW: 13.9 % (ref 11.5–15.5)
WBC: 11.6 10*3/uL — ABNORMAL HIGH (ref 4.0–10.5)
nRBC: 0 % (ref 0.0–0.2)

## 2020-10-11 LAB — BASIC METABOLIC PANEL
Anion gap: 11 (ref 5–15)
BUN: 37 mg/dL — ABNORMAL HIGH (ref 8–23)
CO2: 21 mmol/L — ABNORMAL LOW (ref 22–32)
Calcium: 8.6 mg/dL — ABNORMAL LOW (ref 8.9–10.3)
Chloride: 106 mmol/L (ref 98–111)
Creatinine, Ser: 1.28 mg/dL — ABNORMAL HIGH (ref 0.44–1.00)
GFR, Estimated: 40 mL/min — ABNORMAL LOW (ref >60)
Glucose, Bld: 92 mg/dL (ref 70–99)
Potassium: 3.7 mmol/L (ref 3.5–5.1)
Sodium: 138 mmol/L (ref 135–145)

## 2020-10-11 LAB — SARS CORONAVIRUS 2 (TAT 6-24 HRS): SARS Coronavirus 2: NEGATIVE

## 2020-10-11 LAB — MAGNESIUM: Magnesium: 1.8 mg/dL (ref 1.7–2.4)

## 2020-10-11 MED ORDER — LEVOTHYROXINE SODIUM 25 MCG PO TABS
125.0000 ug | ORAL_TABLET | Freq: Every morning | ORAL | Status: DC
  Administered 2020-10-12: 125 ug via ORAL
  Filled 2020-10-11: qty 1

## 2020-10-11 MED ORDER — AMLODIPINE BESYLATE 2.5 MG PO TABS
2.5000 mg | ORAL_TABLET | Freq: Every day | ORAL | Status: DC
  Administered 2020-10-11 – 2020-10-12 (×2): 2.5 mg via ORAL
  Filled 2020-10-11 (×2): qty 1

## 2020-10-11 NOTE — Progress Notes (Signed)
Occupational Therapy Treatment Patient Details Name: [name] MRN: [id number] DOB: [birth year] Today's Date: 10/11/2020    History of present illness 87 y.o. F admitted to [facility] on 10/07/20 due to a fall where she hit her head on concrete. X-rays and CT negative for fx in extremities. Pt's prior medical history significant of hypertension, hypothyroidism, and dementia.   OT comments  Pt pleasant and readily willing to work with OT, but declined remaining up in chair stating the bed was warmer. Min assist to transfer to [facility], total assist for pericare after BM. Pt sat EOB and completed grooming. Assisted pt to place call to her husband.   Follow Up Recommendations  SNF;Supervision/Assistance - 24 hour    Equipment Recommendations  None recommended by OT    Recommendations for Other Services      Precautions / Restrictions Precautions Precautions: Fall       Mobility Bed Mobility Overal bed mobility: Needs Assistance Bed Mobility: Supine to Sit;Sit to Supine     Supine to sit: Supervision;HOB elevated Sit to supine: Min assist   General bed mobility comments: increased time, min assist for LEs back into bed    Transfers Overall transfer level: Needs assistance Equipment used: Rolling walker (2 wheeled) Transfers: Sit to/from Stand Sit to Stand: Min assist Stand pivot transfers: Min assist       General transfer comment: pt releases walker prior to completing transfer, cues for safety, increased time, pt fearful of falling    Balance Overall balance assessment: Needs assistance   Sitting balance-Leahy Scale: Fair       Standing balance-Leahy Scale: Poor                             ADL either performed or assessed with clinical judgement   ADL Overall ADL's : Needs assistance/impaired     Grooming: Brushing hair;Oral care;Sitting;Minimal assistance Grooming Details (indicate cue type and reason): assist for thoroughness                  Toilet Transfer: Minimal assistance;Stand-pivot;BSC;RW   Toileting- Clothing Manipulation and Hygiene: Total assistance;Sit to/from stand               Vision       Perception     Praxis      Cognition Arousal/Alertness: Awake/alert Behavior During Therapy: WFL for tasks assessed/performed Overall Cognitive Status: History of cognitive impairments - at baseline                                 General Comments: pt with no memory of her fall, why she is in the hospital, recalled her telephone number, but needed assist to place call        Exercises     Shoulder Instructions       General Comments      Pertinent Vitals/ Pain       Pain Assessment: Faces Faces Pain Scale: Hurts little more Pain Location: behind her knees Pain Descriptors / Indicators: Discomfort;Grimacing;Sore Pain Intervention(s): Monitored during session;Repositioned  Home Living                                          Prior Functioning/Environment  Frequency  Min 2X/week        Progress Toward Goals  OT Goals(current goals can now be found in the care plan section)  Progress towards OT goals: Progressing toward goals  Acute Rehab OT Goals Patient Stated Goal: to go home to my husband OT Goal Formulation: With patient/family Time For Goal Achievement: 10/22/20 Potential to Achieve Goals: Fair  Plan Discharge plan remains appropriate    Co-evaluation                 AM-PAC OT "6 Clicks" Daily Activity     Outcome Measure   Help from another person eating meals?: None Help from another person taking care of personal grooming?: A Little Help from another person toileting, which includes using toliet, bedpan, or urinal?: Total Help from another person bathing (including washing, rinsing, drying)?: A Lot Help from another person to put on and taking off regular upper body clothing?: A Little Help from another person to  put on and taking off regular lower body clothing?: Total 6 Click Score: 14    End of Session Equipment Utilized During Treatment: Gait belt;Rolling walker  OT Visit Diagnosis: Unsteadiness on feet (R26.81);Other abnormalities of gait and mobility (R26.89);Muscle weakness (generalized) (M62.81)   Activity Tolerance Patient tolerated treatment well   Patient Left in bed;with call bell/phone within reach;with bed alarm set   Nurse Communication Other (comment) (NT to replace purewick)        Time: 4081-4481 OT Time Calculation (min): 23 min  Charges: OT General Charges $OT Visit: 1 Visit OT Treatments $Self Care/Home Management : 23-37 mins  Martie Round, OTR/L Acute Rehabilitation Services Pager: [phone] Office: [phone]    Evern Bio 10/11/2020, 9:42 AM

## 2020-10-11 NOTE — Progress Notes (Signed)
[facility] Progress Note  Patient: [name]    BDZ:[id number]  DOA: 10/07/2020     Date of Service: the patient was seen and examined on 10/11/2020  Brief hospital course: Past medical history of HTN, hypothyroidism, dementia.  Presents with a fall, unwitnessed. Work-up so far unremarkable for any acute fracture.  Also had hypertensive urgency currently being corrected. Currently plan is to look for safe discharge plan.  Subjective: Confusion still present but improving.  No nausea no vomiting.  No agitation.  No fever no chills.  Oral intake adequate.  Assessment and Plan: 1.  Unwitnessed fall at home. Head contusion. CT head and C-spine negative for any acute fracture or intracranial abnormality. Does not appear to have any focal deficit. Has mild scalp hematoma. X-ray elbow, pelvis as well as x-ray chest negative for any acute fracture as well. PT OT recommend SNF. Continue pain control for now.  2.  Hypertensive urgency Blood pressure significantly elevated on admission. On losartan 100 mg as well as Toprol 100 mg daily. Currently will add clonidine as well. Norvasc was added but due to rapid correction in the blood pressure dose reduced.  3.  Dehydration AKI with CKD 3A. Minimal oral intake.  At risk for further worsening  currently receiving IV hydration.  We will stop and monitor  4.  Hypothyroidism Low TSH and free T4 normal. Continue current Synthroid dose.  5.  Dementia with behavioral issues Has visual hallucination. Anxious and tearful at her baseline. Restraint free for 24 hours.  No agitation. Unfortunately reportedly becomes physically violent to the hospital although no violence in the hospital. On Ativan as needed.  6. Bilateral edema Doppler negative for DVT.  7.  LBBB. Prolonged QT. EKG shows evidence of LBBB.  Chronic, present in 2017 as well. Due to this.  QTC is incorrectly calculated as prolonged. Currently QTC based on the  Bazett calculation 495 ms.  Still prolonged.  Monitor.  8.  Leukocytosis. Likely from dehydration. Will monitor.  Scheduled Meds:  amLODipine  2.5 mg Oral Daily   cloNIDine  0.1 mg Transdermal Weekly   docusate sodium  100 mg Oral BID   enoxaparin (LOVENOX) injection  40 mg Subcutaneous Q24H   latanoprost  1 drop Both Eyes QHS   [START ON 10/12/2020] levothyroxine  125 mcg Oral q morning   metoprolol succinate  100 mg Oral Daily   sodium chloride flush  3 mL Intravenous Q12H   Continuous Infusions:   PRN Meds: acetaminophen **OR** acetaminophen, ALPRAZolam, hydrALAZINE, HYDROcodone-acetaminophen, zolpidem  Body mass index is 25.84 kg/m.        DVT Prophylaxis:   enoxaparin (LOVENOX) injection 40 mg Start: 10/07/20 2200 SCDs Start: 10/07/20 1404    Advance goals of care discussion: Pt is Full code.  Family Communication: no family was present at bedside, at the time of interview.   Data Reviewed: I have personally reviewed and interpreted daily labs, tele strips, imaging. Sodium improving to 3.7.  Serum creatinine 1.28.  Leukocytosis improving as well.  Physical Exam:  General: Appear in mild distress, no Rash; Oral Mucosa Clear, moist. no Abnormal Neck Mass Or lumps, Conjunctiva normal  Cardiovascular: S1 and S2 Present, no Murmur, Respiratory: good respiratory effort, Bilateral Air entry present and CTA, no Crackles, no wheezes Abdomen: Bowel Sound present, Soft and no tenderness Extremities: trace bilateral  Pedal edema Neurology: alert and oriented to person affect appropriate. no new focal deficit Gait not checked due to patient safety concerns  Vitals:   10/10/20 1115 10/10/20 1500 10/10/20 1948 10/11/20 0812  BP: (!) 122/57 (!) 166/63 (!) 165/55 (!) 180/65  Pulse: 64 66 68 63  Resp: 18 17 17 19   Temp: 98.1 F (36.7 C) 98.9 F (37.2 C) 98.2 F (36.8 C) 98 F (36.7 C)  TempSrc: Oral Oral Oral Oral  SpO2: 95% 98% 96% 98%  Weight:      Height:         Disposition:  Status is: Inpatient  Remains inpatient appropriate because:Altered mental status and Unsafe d/c plan  Dispo: The patient is from: Home              Anticipated d/c is to: SNF              Patient currently is not medically stable to d/c.   Difficult to place patient No  Time spent: 35 minutes. I reviewed all nursing notes, pharmacy notes, vitals, pertinent old records. I have discussed plan of care as described above with RN.  Author: , MD [facility] 10/11/2020 4:30 PM  To reach On-call, see care teams to locate the attending and reach out via [email or web address]. Between 7PM-7AM, please contact night-coverage If you still have difficulty reaching the attending provider, please page the [facility] (Director on Call) for [facility] on amion for assistance.

## 2020-10-11 NOTE — Progress Notes (Signed)
Physical Therapy Treatment Patient Details Name: [name] MRN: [id number] DOB: [birth year] Today's Date: 10/11/2020    History of Present Illness 87 y.o. F admitted to [facility] on 10/07/20 due to a fall where she hit her head on concrete. X-rays and CT negative for fx in extremities. Pt's prior medical history significant of hypertension, hypothyroidism, and dementia.    PT Comments    Seated in recliner. Continues to require min assistance. Remains to benefit from rehab in a post acute setting to improve strength and function before returning home.     Follow Up Recommendations  SNF     Equipment Recommendations  None recommended by PT    Recommendations for Other Services       Precautions / Restrictions Precautions Precautions: Fall Precaution Comments: Pt requires a lot of redirection, will use RW, however frequently lets go of it. Restrictions Weight Bearing Restrictions: No    Mobility  Bed Mobility               General bed mobility comments: Pt seated in recliner on arrival.    Transfers Overall transfer level: Needs assistance Equipment used: Rolling walker (2 wheeled) Transfers: Sit to/from Stand Sit to Stand: Min assist Stand pivot transfers: Min assist       General transfer comment: Min assistance to boost into standing.  Cues for hand placement to and from seated surface.  Pt required cues to keep hands on RW.  Ambulation/Gait Ambulation/Gait assistance: Min assist Gait Distance (Feet): 150 Feet Assistive device: Rolling walker (2 wheeled) Gait Pattern/deviations: Step-through pattern;Trunk flexed;Decreased stride length     General Gait Details: Cues for upper trunk control and RW safety.   Pt able to make great progress in distance but continues to require min assistance.   Stairs             Wheelchair Mobility    Modified Rankin (Stroke Patients Only)       Balance Overall balance assessment: Needs assistance    Sitting balance-Leahy Scale: Fair       Standing balance-Leahy Scale: Poor                              Cognition Arousal/Alertness: Awake/alert Behavior During Therapy: WFL for tasks assessed/performed Overall Cognitive Status: History of cognitive impairments - at baseline                                 General Comments: pt with no memory of her fall, why she is in the hospital, recalled her telephone number, but needed assist to place call      Exercises General Exercises - Lower Extremity Ankle Circles/Pumps: AROM;Both;10 reps;Supine Long Arc Quad: AROM;Both;10 reps;Seated Hip Flexion/Marching: AROM;Both;10 reps;Seated Other Exercises Other Exercises: B shoulder flexion x 10 reps with limited ROM past 90 Other Exercises: B chest presses x 10 reps.    General Comments        Pertinent Vitals/Pain Pain Assessment: Faces Faces Pain Scale: Hurts little more Pain Location: behind her knees Pain Descriptors / Indicators: Discomfort;Grimacing;Sore Pain Intervention(s): Monitored during session;Repositioned    Home Living                      Prior Function            PT Goals (current goals can now be found in the care plan  section) Acute Rehab PT Goals Patient Stated Goal: to go home to my husband Potential to Achieve Goals: Good Progress towards PT goals: Progressing toward goals    Frequency    Min 2X/week      PT Plan Current plan remains appropriate    Co-evaluation              AM-PAC PT "6 Clicks" Mobility   Outcome Measure  Help needed turning from your back to your side while in a flat bed without using bedrails?: A Little Help needed moving from lying on your back to sitting on the side of a flat bed without using bedrails?: A Lot Help needed moving to and from a bed to a chair (including a wheelchair)?: A Lot Help needed standing up from a chair using your arms (e.g., wheelchair or bedside chair)?:  A Lot Help needed to walk in hospital room?: A Little Help needed climbing 3-5 steps with a railing? : A Lot 6 Click Score: 14    End of Session Equipment Utilized During Treatment: Gait belt Activity Tolerance: Patient tolerated treatment well Patient left: in chair;with call bell/phone within reach;with chair alarm set (with sitter alarm belt applied ( patient able to remove at any time )) Nurse Communication: Mobility status PT Visit Diagnosis: Muscle weakness (generalized) (M62.81);Difficulty in walking, not elsewhere classified (R26.2);Pain Pain - Right/Left:  (generalized) Pain - part of body:  (generalized)     Time: 0092-3300 PT Time Calculation (min) (ACUTE ONLY): 17 min  Charges:  $Gait Training: 8-22 mins                     Bonney Leitz , PTA Acute Rehabilitation Services Pager [phone] Office [phone]    Nasri Boakye Artis Delay 10/11/2020, 2:10 PM

## 2020-10-11 NOTE — Plan of Care (Signed)

## 2020-10-11 NOTE — Plan of Care (Signed)

## 2020-10-11 NOTE — TOC Progression Note (Addendum)
Transition of Care [facility]) - Progression Note    Patient Details  Name: [name] MRN: [id number] Date of Birth: January 13, 1930  Transition of Care [facility] CM/SW Contact  Ralene Bathe, LCSWA Phone Number: 10/11/2020, 12:49 PM  Clinical Narrative:    CSW spoke with the patient and husband at bedside.  The patient was alert and oriented to person and place.  CSW discussed possible discharge plans for the patient.  The husband mentioned that he has looked into Assisted living facilities and is still researching.  CSW spoke with the patient's husband about the need for rehab that assisted living facilities may not provide.  The patient's husband then reported that he would like for the patient to go to a SNF, observe how the patient progresses, and then decide on next steps.  The patient's husband asked the CSW look into [place], [facility], [address], [facility], [address], and [facility].  CSW contacted the following agencies.   [facility]- left VM [facility]- left message [facility]- declined [facility]- declined [facility]- no beds available [facility]- left message.  Pending -SNF bed offer  14:23- CSW received a returned call from [facility].  They can accept the patient tomorrow.    Pending: COVID test  13:30-  CSW spoke with the patient and patient's husband at bedside.  The patient's husband is going to view the facility today.    Expected Discharge Plan: Skilled Nursing Facility (SNF with [facility])   Expected Discharge Plan and Services Expected Discharge Plan: Skilled Nursing Facility (SNF with [facility])       Living arrangements for the past 2 months: Single Family Home                                       Social Determinants of Health (SDOH) Interventions    Readmission Risk Interventions No flowsheet data found.

## 2020-10-11 NOTE — Care Management Important Message (Signed)
Important Message  Patient Details  Name: [name] MRN: [id number] Date of Birth: [birth year]   Medicare Important Message Given:  Yes     [name] 10/11/2020, 2:17 PM

## 2020-10-12 ENCOUNTER — Encounter: Payer: Self-pay | Admitting: Podiatry

## 2020-10-12 LAB — BASIC METABOLIC PANEL
Anion gap: 10 (ref 5–15)
BUN: 28 mg/dL — ABNORMAL HIGH (ref 8–23)
CO2: 23 mmol/L (ref 22–32)
Calcium: 8.8 mg/dL — ABNORMAL LOW (ref 8.9–10.3)
Chloride: 104 mmol/L (ref 98–111)
Creatinine, Ser: 1.02 mg/dL — ABNORMAL HIGH (ref 0.44–1.00)
GFR, Estimated: 53 mL/min — ABNORMAL LOW (ref >60)
Glucose, Bld: 107 mg/dL — ABNORMAL HIGH (ref 70–99)
Potassium: 3.4 mmol/L — ABNORMAL LOW (ref 3.5–5.1)
Sodium: 137 mmol/L (ref 135–145)

## 2020-10-12 MED ORDER — CLONIDINE 0.1 MG/24HR TD PTWK: 0.1000 mg | MEDICATED_PATCH | TRANSDERMAL | 12 refills | Status: DC

## 2020-10-12 MED ORDER — AMLODIPINE BESYLATE 2.5 MG PO TABS: 2.5000 mg | ORAL_TABLET | Freq: Every day | ORAL | 0 refills | Status: DC

## 2020-10-12 MED ORDER — ALPRAZOLAM 0.25 MG PO TABS: 0.2500 mg | ORAL_TABLET | Freq: Two times a day (BID) | ORAL | 0 refills | Status: DC | PRN

## 2020-10-12 MED ORDER — POTASSIUM CHLORIDE CRYS ER 20 MEQ PO TBCR
20.0000 meq | EXTENDED_RELEASE_TABLET | Freq: Once | ORAL | Status: AC
  Administered 2020-10-12: 20 meq via ORAL
  Filled 2020-10-12: qty 1

## 2020-10-12 MED ORDER — LEVOTHYROXINE SODIUM 125 MCG PO TABS: 125.0000 ug | ORAL_TABLET | Freq: Every morning | ORAL | 0 refills | Status: DC

## 2020-10-12 MED ORDER — DOCUSATE SODIUM 100 MG PO CAPS: 100.0000 mg | ORAL_CAPSULE | Freq: Two times a day (BID) | ORAL | 0 refills | Status: DC

## 2020-10-12 NOTE — TOC Transition Note (Signed)
Transition of Care [facility]) - CM/SW Discharge Note   Patient Details  Name: [name] MRN: [id number] Date of Birth: 27-Jul-1929  [facility] CM/SW Contact:  Ralene Bathe, LCSWA Phone Number: 10/12/2020, 11:59 AM   Clinical Narrative:    Patient will DC to: [facility] Anticipated DC date: 10/12/2020 Family notified: Yes Transport by: Sharin Mons   Per MD patient ready for DC to SNF. RN to call report prior to discharge [phone]. RN, patient, patient's family, and facility notified of DC. Discharge Summary and FL2 sent to facility. DC packet on chart. Ambulance transport requested for patient.   CSW will sign off for now as social work intervention is no longer needed. Please consult [place] again if new needs arise.     Final next level of care: Skilled Nursing Facility Barriers to Discharge: No Barriers Identified   Patient Goals and CMS Choice Patient states their goals for this hospitalization and ongoing recovery are:: Per pt's spouse: "Want her to be hospitable." CMS Medicare.gov Compare Post Acute Care list provided to:: Patient Represenative (must comment) (Pt has dementia. Information will be given to spouse.) Choice offered to / list presented to : Spouse  Discharge Placement              Patient chooses bed at:  [facility]) Patient to be transferred to facility by: [facility] Name of family member notified: Ailynn Gow, son Patient and family notified of of transfer: 10/12/20  Discharge Plan and Services                                     Social Determinants of Health (SDOH) Interventions     Readmission Risk Interventions No flowsheet data found.

## 2020-10-12 NOTE — Progress Notes (Signed)
Called facility and gave report to Shanda Bumps, all questions answered. Pt not in distress, to discharge to SNF with belongings via [facility].

## 2020-10-12 NOTE — Plan of Care (Signed)
  Problem: Education: Goal: Knowledge of General Education information will improve Description: Including pain rating scale, medication(s)/side effects and non-pharmacologic comfort measures Outcome: Progressing   Problem: Activity: Goal: Risk for activity intolerance will decrease Outcome: Progressing   Problem: Nutrition: Goal: Adequate nutrition will be maintained Outcome: Progressing   Problem: Coping: Goal: Level of anxiety will decrease Outcome: Progressing   Problem: Elimination: Goal: Will not experience complications related to bowel motility Outcome: Progressing   Problem: Pain Managment: Goal: General experience of comfort will improve Outcome: Progressing   Problem: Skin Integrity: Goal: Risk for impaired skin integrity will decrease Outcome: Progressing   

## 2020-10-12 NOTE — Discharge Summary (Signed)
[facility] Discharge Summary   Patient: [name] JXB:[id number]  PCP: Gweneth Dimitri, MD  Date of admission: 10/07/2020   Date of discharge:  10/12/2020     Discharge Diagnoses:  Principal Problem:   Fall Active Problems:   Prolonged QT interval   Dementia (HCC)   CKD (chronic kidney disease), stage III (HCC)   Hypothyroidism   Head contusion   Hypertensive urgency   Concussion with no loss of consciousness  Admitted From: home Disposition:  SNF   Recommendations for Outpatient Follow-up:  PCP: please follow up with PCP in 1 week   Follow-up Information     Gweneth Dimitri, MD. Schedule an appointment as soon as possible for a visit in 2 week(s).   Specialty: Family Medicine Contact information: [address] [place] [address] [phone]                Discharge Instructions     Diet - low sodium heart healthy   Complete by: As directed    Increase activity slowly   Complete by: As directed        Diet recommendation: Cardiac diet  Activity: The patient is advised to gradually reintroduce usual activities, as tolerated  Discharge Condition: stable  Code Status: Full code   History of present illness: As per the H and P dictated on admission, "[name] is a 87 y.o. female with medical history significant of hypertension, hypothyroidism, and dementia who presents after having a fall at home.  History is obtained from review of records and is present at bedside as she has some dementia.  Patient cannot tell he how or why she fell and is not totally clear if she lost consciousness or not.  Has been reports that she had gone out to get the mail and was using her cane.  Husband went to check on her after she did not come back in the house after recent round of time found her laying on the ground with blood coming from back of her scalp.  She complains of pain in her right elbow.  Denies having any shortness of breath, chest  pain, nausea, vomiting, or diarrhea symptoms.  She repeatedly states that her husband does not work and can take her home now.  At baseline patient husband reports that she has dementia and she was alert and oriented to person and sometimes date.  However she is unable to tell me where she is, the year, or date at this time.  At home he notes that she has been refusing to take her medications at times and her primary care provider had been working to get better control over her blood pressures.  Review of records on Care Everywhere does show that that patient's blood pressures have been elevated into the 200s at last few office visits.   ED Course: Upon admission to the emergency department patient was noted to be afebrile, pulse 58-73, respirations 15-25, blood pressure was elevated up to 256/93, and O2 saturation maintained on room air.  CT scan of the head and cervical spine showed no evidence of acute intracranial cervical spine injury, but did note a scalp hematoma without calvarial fracture.  Labs significant for BUN 30 and creatinine 1.06.  Influenza and COVID-19 screening were negative.  Chest and pelvic x-rays showed no acute abnormality.  Urinalysis showed no significant signs of infection.  X-ray was also done of the right elbow with no acute bony abnormality appreciated but there was question  of a possible soft tissue injury or small laceration patient had been given a total of hydralazine 30 mg IV with some improvement in blood pressure"  Hospital Course:  Summary of her active problems in the hospital is as following. 1.  Unwitnessed fall at home. Head contusion. CT head and C-spine negative for any acute fracture or intracranial abnormality. Does not appear to have any focal deficit. Has mild scalp hematoma. X-ray elbow, pelvis as well as x-ray chest negative for any acute fracture as well. PT OT recommend SNF.   2.  Hypertensive urgency Blood pressure significantly elevated on  admission. On losartan 100 mg as well as Toprol 100 mg daily. Currently will add clonidine as well. Norvasc was added but due to rapid correction in the blood pressure dose reduced.   3.  Dehydration AKI with CKD 3A. Minimal oral intake.  At risk for further worsening Was receiving IV hydration. Improved off of fluids.    4.  Hypothyroidism Low TSH and free T4 normal. Continue current Synthroid dose.   5.  Dementia with behavioral issues Has visual hallucination. Anxious and tearful at her baseline. Restraint free for 24 hours.  No agitation. Unfortunately reportedly becomes physically violent to the hospital although no violence in the hospital. On Ativan as needed.   6. Bilateral edema Doppler negative for DVT.   7.  LBBB. Prolonged QT. EKG shows evidence of LBBB.  Chronic, present in 2017 as well. Due to this.  QTC is incorrectly calculated as prolonged. Currently QTC based on the Bazett calculation 495 ms.  Still prolonged.  Monitor.   8.  Leukocytosis. Likely from dehydration. Will monitor.  Patient was seen by physical therapy, who recommended SNF. On the day of the discharge the patient's vitals were stable, and no other new acute medical condition were reported. The patient was felt safe to be discharge at SNF with Therapy.  Consultants: none Procedures: none  DISCHARGE MEDICATION: Allergies as of 10/12/2020       Reactions   Elemental Sulfur Anaphylaxis   Penicillins Anaphylaxis   Cortisone    Break out   Sulfa Antibiotics         Medication List     STOP taking these medications    losartan 100 MG tablet Commonly known as: COZAAR       TAKE these medications    ALPRAZolam 0.25 MG tablet Commonly known as: XANAX Take 1 tablet (0.25 mg total) by mouth 2 (two) times daily as needed for anxiety (Anxiety or delirium).   amLODipine 2.5 MG tablet Commonly known as: NORVASC Take 1 tablet (2.5 mg total) by mouth daily. Start taking on: October 13, 2020   amoxicillin 500 MG capsule Commonly known as: AMOXIL Take 500 mg by mouth as needed (dental procedures).   cloNIDine 0.1 mg/24hr patch Commonly known as: CATAPRES - Dosed in mg/24 hr Place 1 patch (0.1 mg total) onto the skin once a week. Start taking on: October 16, 2020   docusate sodium 100 MG capsule Commonly known as: COLACE Take 1 capsule (100 mg total) by mouth 2 (two) times daily.   latanoprost 0.005 % ophthalmic solution Commonly known as: XALATAN Place 1 drop into both eyes at bedtime.   levothyroxine 125 MCG tablet Commonly known as: SYNTHROID Take 1 tablet (125 mcg total) by mouth every morning. Start taking on: October 13, 2020 What changed:  medication strength how much to take   metoprolol succinate 100 MG 24 hr tablet Commonly known as:  TOPROL-XL Take 100 mg by mouth daily.   rosuvastatin 10 MG tablet Commonly known as: CRESTOR Take 10 mg by mouth 2 (two) times a week.   vitamin B-12 1000 MCG tablet Commonly known as: CYANOCOBALAMIN Take 1,000 mcg by mouth every other day.        Discharge Exam: Filed Weights   10/07/20 0833  Weight: 74.8 kg   Vitals:   10/11/20 2200 10/12/20 0731  BP: (!) 149/80 (!) 196/75  Pulse:  64  Resp:  16  Temp:  97.9 F (36.6 C)  SpO2:  97%   General: Appear in mild distress, no Rash; Oral Mucosa Clear, moist. no Abnormal Neck Mass Or lumps, Conjunctiva normal  Cardiovascular: S1 and S2 Present, no Murmur, Respiratory: good respiratory effort, Bilateral Air entry present and CTA, no Crackles, no wheezes Abdomen: Bowel Sound present, Soft and no tenderness Extremities: no Pedal edema Neurology: alert and NOT oriented to time, place, and person affect appropriate. no new focal deficit Gait not checked due to patient safety concerns   The results of significant diagnostics from this hospitalization (including imaging, microbiology, ancillary and laboratory) are listed below for reference.    Significant  Diagnostic Studies: DG Elbow Complete Right  Result Date: 10/07/2020 CLINICAL DATA:  Fall.  Burning right elbow pain after fall. EXAM: RIGHT ELBOW - COMPLETE 3+ VIEW COMPARISON:  None. FINDINGS: Negative for fracture, dislocation or large joint effusion. Normal alignment of the right elbow. Subtle soft tissue lucency near the olecranon and cannot exclude a small laceration. IMPRESSION: No acute bone abnormality to the right elbow. Question a soft tissue injury or small laceration. Electronically Signed   By: Richarda Overlie M.D.   On: 10/07/2020 11:08   CT Head Wo Contrast  Result Date: 10/07/2020 CLINICAL DATA:  Penetrating head trauma. EXAM: CT HEAD WITHOUT CONTRAST CT CERVICAL SPINE WITHOUT CONTRAST TECHNIQUE: Multidetector CT imaging of the head and cervical spine was performed following the standard protocol without intravenous contrast. Multiplanar CT image reconstructions of the cervical spine were also generated. COMPARISON:  None. FINDINGS: CT HEAD FINDINGS Brain: Generalized atrophy. Chronic lacunar infarct at the left caudate nucleus. No evidence of acute infarct, hemorrhage, hydrocephalus, or mass. Vascular: No hyperdense vessel or unexpected calcification. Skull: Right posterior scalp hematoma.  No calvarial fracture Sinuses/Orbits: Bilateral cataract resection.  No visible injury. CT CERVICAL SPINE FINDINGS Alignment: No traumatic malalignment. Degenerative anterolisthesis at C3-4, C4-5, and C7-T1 Skull base and vertebrae: No acute fracture. No primary bone lesion or focal pathologic process. Soft tissues and spinal canal: No prevertebral fluid or swelling. No visible canal hematoma. Disc levels: Diffuse degenerative disc narrowing and ridging. Multilevel facet spurring with C2-3 and C4-5 facet ankylosis. Advanced C1-2 facet osteoarthritis. Upper chest: Right apical pleural based scar like appearance. IMPRESSION: 1. No evidence of acute intracranial or cervical spine injury. 2. Scalp hematoma without  calvarial fracture. Electronically Signed   By: Marnee Spring M.D.   On: 10/07/2020 08:16   CT Cervical Spine Wo Contrast  Result Date: 10/07/2020 CLINICAL DATA:  Penetrating head trauma. EXAM: CT HEAD WITHOUT CONTRAST CT CERVICAL SPINE WITHOUT CONTRAST TECHNIQUE: Multidetector CT imaging of the head and cervical spine was performed following the standard protocol without intravenous contrast. Multiplanar CT image reconstructions of the cervical spine were also generated. COMPARISON:  None. FINDINGS: CT HEAD FINDINGS Brain: Generalized atrophy. Chronic lacunar infarct at the left caudate nucleus. No evidence of acute infarct, hemorrhage, hydrocephalus, or mass. Vascular: No hyperdense vessel or unexpected calcification. Skull: Right  posterior scalp hematoma.  No calvarial fracture Sinuses/Orbits: Bilateral cataract resection.  No visible injury. CT CERVICAL SPINE FINDINGS Alignment: No traumatic malalignment. Degenerative anterolisthesis at C3-4, C4-5, and C7-T1 Skull base and vertebrae: No acute fracture. No primary bone lesion or focal pathologic process. Soft tissues and spinal canal: No prevertebral fluid or swelling. No visible canal hematoma. Disc levels: Diffuse degenerative disc narrowing and ridging. Multilevel facet spurring with C2-3 and C4-5 facet ankylosis. Advanced C1-2 facet osteoarthritis. Upper chest: Right apical pleural based scar like appearance. IMPRESSION: 1. No evidence of acute intracranial or cervical spine injury. 2. Scalp hematoma without calvarial fracture. Electronically Signed   By: Marnee SpringJonathon  Watts M.D.   On: 10/07/2020 08:16   DG Pelvis Portable  Result Date: 10/07/2020 CLINICAL DATA:  Fall on blood thinners EXAM: PORTABLE PELVIS 1-2 VIEWS COMPARISON:  None. FINDINGS: There is no evidence of pelvic fracture or diastasis. No pelvic bone lesions are seen. Generalized osteopenia. The sacrum is largely obscured by bowel gas. IMPRESSION: Negative for fracture. Electronically  Signed   By: Marnee SpringJonathon  Watts M.D.   On: 10/07/2020 08:17   DG Chest Portable 1 View  Result Date: 10/07/2020 CLINICAL DATA:  Fall on blood thinners. EXAM: PORTABLE CHEST 1 VIEW COMPARISON:  05/09/2015 FINDINGS: Borderline cardiomegaly. Mitral annular calcification/ossification. Interstitial prominence which is stable. There is no edema, consolidation, effusion, or pneumothorax. No visible fracture. IMPRESSION: No evidence of acute disease. Electronically Signed   By: Marnee SpringJonathon  Watts M.D.   On: 10/07/2020 08:19   DG Abd Portable 1V  Result Date: 10/09/2020 CLINICAL DATA:  87 year old female with a history of abdominal pain with nausea and vomiting EXAM: PORTABLE ABDOMEN - 1 VIEW COMPARISON:  None. FINDINGS: Gas within stomach small bowel and colon. No abnormal distension. No air-fluid levels on this single supine image. No significant formed stool burden. Surgical changes of cholecystectomy. Presumably pelvic phleboliths. No unexpected radiopaque foreign body. No unexpected soft tissue density or calcifications. Degenerative changes of the spine. Mild apex left curvature. No acute displaced fracture. IMPRESSION: Nonobstructive bowel gas pattern. Electronically Signed   By: Gilmer MorJaime  Wagner D.O.   On: 10/09/2020 14:57   VAS US LOWER EXTREMITY VENOUS (DVT)  Result Date: 10/09/2020  Lower Venous DVT Study Patient Name:  [name]  Date of Exam:   10/09/2020 Medical Rec #: [id number]           Accession #:    [id number] Date of Birth: 1930/01/28           Patient Gender: F Patient Age:   89Y [facility] Procedure:      VAS US LOWER EXTREMITY VENOUS (DVT) Referring Phys: [id number] [facility] Zyasia Halbleib --------------------------------------------------------------------------------  Indications: Swelling, and Pain. Other Indications: PMH note: History of left lower extremity DVT following                    arthroscopy and also multiple blood clots in her leg. Comparison Study: No prior  studies on file. Performing Technologist: Marilynne Halstedita Sturdivant RDMS, RVT  Examination Guidelines: A complete evaluation includes B-mode imaging, spectral Doppler, color Doppler, and power Doppler as needed of all accessible portions of each vessel. Bilateral testing is considered an integral part of a complete examination. Limited examinations for reoccurring indications may be performed as noted. The reflux portion of the exam is performed with the patient in reverse Trendelenburg.  +---------+---------------+---------+-----------+----------+------------------+ RIGHT    CompressibilityPhasicitySpontaneityPropertiesThrombus Aging     +---------+---------------+---------+-----------+----------+------------------+ CFV  Full           Yes      Yes                                     +---------+---------------+---------+-----------+----------+------------------+ SFJ      Partial                                                         +---------+---------------+---------+-----------+----------+------------------+ FV Prox  Full                                                            +---------+---------------+---------+-----------+----------+------------------+ FV Mid   Full                                         patent narrow vein +---------+---------------+---------+-----------+----------+------------------+ FV DistalFull                                         patent narrow vein +---------+---------------+---------+-----------+----------+------------------+ PFV      Full                                                            +---------+---------------+---------+-----------+----------+------------------+ POP      Full           Yes      Yes                                     +---------+---------------+---------+-----------+----------+------------------+ PTV      Full                                                             +---------+---------------+---------+-----------+----------+------------------+ PERO     Full                                                            +---------+---------------+---------+-----------+----------+------------------+   +---------+---------------+---------+-----------+----------+-------------------+ LEFT     CompressibilityPhasicitySpontaneityPropertiesThrombus Aging      +---------+---------------+---------+-----------+----------+-------------------+ CFV      Full           Yes      Yes                                      +---------+---------------+---------+-----------+----------+-------------------+  SFJ      Full                                                             +---------+---------------+---------+-----------+----------+-------------------+ FV Prox  Full                                                             +---------+---------------+---------+-----------+----------+-------------------+ FV Mid   Full                                                             +---------+---------------+---------+-----------+----------+-------------------+ FV DistalFull                                                             +---------+---------------+---------+-----------+----------+-------------------+ PFV      Full                                                             +---------+---------------+---------+-----------+----------+-------------------+ POP      Full           Yes      Yes                                      +---------+---------------+---------+-----------+----------+-------------------+ PTV                                                   Patent with some                                                          chronic changes     +---------+---------------+---------+-----------+----------+-------------------+ PERO                                                  Patent with some  chronic changes     +---------+---------------+---------+-----------+----------+-------------------+     Summary: RIGHT: - There is no evidence of deep vein thrombosis in the lower extremity.  - No cystic structure found in the popliteal fossa.  LEFT: - No evidence of acute deep vein thrombosis in the left lower extremity. The posterior tibial and peroenal veins appear patent with some chronic changes noted.  *See table(s) above for measurements and observations. Electronically signed by Fabienne Bruns MD on 10/09/2020 at 4:31:30 PM.    Final     Microbiology: Recent Results (from the past 240 hour(s))  Resp Panel by RT-PCR (Flu A&B, Covid) Nasopharyngeal Swab     Status: None   Collection Time: 10/07/20  9:33 AM   Specimen: Nasopharyngeal Swab; Nasopharyngeal(NP) swabs in vial transport medium  Result Value Ref Range Status   SARS Coronavirus 2 by RT PCR NEGATIVE NEGATIVE Final    Comment: (NOTE) SARS-CoV-2 target nucleic acids are NOT DETECTED.  The SARS-CoV-2 RNA is generally detectable in upper respiratory specimens during the acute phase of infection. The lowest concentration of SARS-CoV-2 viral copies this assay can detect is 138 copies/mL. A negative result does not preclude SARS-Cov-2 infection and should not be used as the sole basis for treatment or other patient management decisions. A negative result may occur with  improper specimen collection/handling, submission of specimen other than nasopharyngeal swab, presence of viral mutation(s) within the areas targeted by this assay, and inadequate number of viral copies(<138 copies/mL). A negative result must be combined with clinical observations, patient history, and epidemiological information. The expected result is Negative.  Fact Sheet for Patients:  BloggerCourse.com  Fact Sheet for Healthcare Providers:   SeriousBroker.it  This test is no t yet approved or cleared by the [place] FDA and  has been authorized for detection and/or diagnosis of SARS-CoV-2 by FDA under an Emergency Use Authorization (EUA). This EUA will remain  in effect (meaning this test can be used) for the duration of the COVID-19 declaration under Section 564(b)(1) of the Act, 21 U.S.C.section 360bbb-3(b)(1), unless the authorization is terminated  or revoked sooner.       Influenza A by PCR NEGATIVE NEGATIVE Final   Influenza B by PCR NEGATIVE NEGATIVE Final    Comment: (NOTE) The Xpert Xpress SARS-CoV-2/FLU/RSV plus assay is intended as an aid in the diagnosis of influenza from Nasopharyngeal swab specimens and should not be used as a sole basis for treatment. Nasal washings and aspirates are unacceptable for Xpert Xpress SARS-CoV-2/FLU/RSV testing.  Fact Sheet for Patients: BloggerCourse.com  Fact Sheet for Healthcare Providers: SeriousBroker.it  This test is not yet approved or cleared by the [place] FDA and has been authorized for detection and/or diagnosis of SARS-CoV-2 by FDA under an Emergency Use Authorization (EUA). This EUA will remain in effect (meaning this test can be used) for the duration of the COVID-19 declaration under Section 564(b)(1) of the Act, 21 U.S.C. section 360bbb-3(b)(1), unless the authorization is terminated or revoked.  Performed at [facility], [address], [place], [place] [address]   SARS CORONAVIRUS 2 (TAT 6-24 HRS) Nasopharyngeal Nasopharyngeal Swab     Status: None   Collection Time: 10/11/20  2:42 PM   Specimen: Nasopharyngeal Swab  Result Value Ref Range Status   SARS Coronavirus 2 NEGATIVE NEGATIVE Final    Comment: (NOTE) SARS-CoV-2 target nucleic acids are NOT DETECTED.  The SARS-CoV-2 RNA is generally detectable in upper and lower respiratory specimens during the  acute phase of infection.  Negative results do not preclude SARS-CoV-2 infection, do not rule out co-infections with other pathogens, and should not be used as the sole basis for treatment or other patient management decisions. Negative results must be combined with clinical observations, patient history, and epidemiological information. The expected result is Negative.  Fact Sheet for Patients: HairSlick.no  Fact Sheet for Healthcare Providers: quierodirigir.com  This test is not yet approved or cleared by the [place] FDA and  has been authorized for detection and/or diagnosis of SARS-CoV-2 by FDA under an Emergency Use Authorization (EUA). This EUA will remain  in effect (meaning this test can be used) for the duration of the COVID-19 declaration under Se ction 564(b)(1) of the Act, 21 U.S.C. section 360bbb-3(b)(1), unless the authorization is terminated or revoked sooner.  Performed at [facility], [address], [place], [place] [address]      Labs: CBC: Recent Labs  Lab 10/07/20 [phone] 10/07/20 0739 10/08/20 0014 10/09/20 0155 10/10/20 0500 10/11/20 0445  WBC 7.1  --  11.4* 11.9* 16.4* 11.6*  NEUTROABS 4.5  --   --  9.9*  --   --   HGB 14.7 16.0* 13.9 14.4 14.8 13.3  HCT 46.4* 47.0* 43.4 44.6 44.9 40.8  MCV 93.0  --  91.6 91.0 90.9 91.7  PLT 207  --  225 205 227 197   Basic Metabolic Panel: Recent Labs  Lab 10/08/20 0014 10/09/20 0155 10/10/20 0500 10/11/20 0445 10/12/20 0118  NA 139 141 140 138 137  K 3.7 3.4* 3.9 3.7 3.4*  CL 104 103 106 106 104  CO2 22 24 23  21* 23  GLUCOSE 132* 113* 99 92 107*  BUN 28* 19 31* 37* 28*  CREATININE 1.08* 0.99 1.23* 1.28* 1.02*  CALCIUM 9.5 9.3 9.1 8.6* 8.8*  MG  --   --  1.8 1.8  --    Liver Function Tests: Recent Labs  Lab 10/07/20 0734 10/09/20 0155  AST 37 73*  ALT 26 29  ALKPHOS 62 56  BILITOT 0.8 1.6*  PROT 7.6 6.6  ALBUMIN 4.0 3.4*    CBG: No results for input(s): GLUCAP in the last 168 hours.  Time spent: 35 minutes  Signed:  10/11/20  [facility]  10/12/2020

## 2020-12-29 ENCOUNTER — Emergency Department (HOSPITAL_COMMUNITY): Payer: Medicare Other

## 2020-12-29 ENCOUNTER — Emergency Department (EMERGENCY_DEPARTMENT_HOSPITAL)
Admit: 2020-12-29 | Discharge: 2020-12-29 | Disposition: A | Discharge status: Home / Self Care | Payer: Medicare Other | Attending: Emergency Medicine | Admitting: Emergency Medicine

## 2020-12-29 ENCOUNTER — Emergency Department (HOSPITAL_COMMUNITY)
Admission: EM | Admit: 2020-12-29 | Discharge: 2020-12-30 | Disposition: A | Discharge status: Home / Self Care | Payer: Medicare Other | Attending: Emergency Medicine | Admitting: Emergency Medicine

## 2020-12-29 ENCOUNTER — Other Ambulatory Visit: Payer: Self-pay

## 2020-12-29 DIAGNOSIS — S0990XA Unspecified injury of head, initial encounter: Secondary | ICD-10-CM | POA: Diagnosis present

## 2020-12-29 DIAGNOSIS — L03116 Cellulitis of left lower limb: Secondary | ICD-10-CM | POA: Diagnosis not present

## 2020-12-29 DIAGNOSIS — W19XXXA Unspecified fall, initial encounter: Secondary | ICD-10-CM | POA: Insufficient documentation

## 2020-12-29 DIAGNOSIS — F0390 Unspecified dementia without behavioral disturbance: Secondary | ICD-10-CM | POA: Diagnosis not present

## 2020-12-29 DIAGNOSIS — N289 Disorder of kidney and ureter, unspecified: Secondary | ICD-10-CM | POA: Diagnosis not present

## 2020-12-29 DIAGNOSIS — Z79899 Other long term (current) drug therapy: Secondary | ICD-10-CM | POA: Diagnosis not present

## 2020-12-29 DIAGNOSIS — S0003XA Contusion of scalp, initial encounter: Secondary | ICD-10-CM | POA: Diagnosis not present

## 2020-12-29 DIAGNOSIS — N183 Chronic kidney disease, stage 3 unspecified: Secondary | ICD-10-CM | POA: Insufficient documentation

## 2020-12-29 DIAGNOSIS — Z96652 Presence of left artificial knee joint: Secondary | ICD-10-CM | POA: Insufficient documentation

## 2020-12-29 DIAGNOSIS — I129 Hypertensive chronic kidney disease with stage 1 through stage 4 chronic kidney disease, or unspecified chronic kidney disease: Secondary | ICD-10-CM | POA: Insufficient documentation

## 2020-12-29 DIAGNOSIS — R609 Edema, unspecified: Secondary | ICD-10-CM | POA: Diagnosis not present

## 2020-12-29 DIAGNOSIS — E039 Hypothyroidism, unspecified: Secondary | ICD-10-CM | POA: Insufficient documentation

## 2020-12-29 DIAGNOSIS — I824Y2 Acute embolism and thrombosis of unspecified deep veins of left proximal lower extremity: Secondary | ICD-10-CM | POA: Insufficient documentation

## 2020-12-29 LAB — CBC WITH DIFFERENTIAL/PLATELET
Abs Immature Granulocytes: 0.05 10*3/uL (ref 0.00–0.07)
Basophils Absolute: 0 10*3/uL (ref 0.0–0.1)
Basophils Relative: 1 %
Eosinophils Absolute: 0.3 10*3/uL (ref 0.0–0.5)
Eosinophils Relative: 4 %
HCT: 37.5 % (ref 36.0–46.0)
Hemoglobin: 11.6 g/dL — ABNORMAL LOW (ref 12.0–15.0)
Immature Granulocytes: 1 %
Lymphocytes Relative: 15 %
Lymphs Abs: 1 10*3/uL (ref 0.7–4.0)
MCH: 30.9 pg (ref 26.0–34.0)
MCHC: 30.9 g/dL (ref 30.0–36.0)
MCV: 100 fL (ref 80.0–100.0)
Monocytes Absolute: 0.8 10*3/uL (ref 0.1–1.0)
Monocytes Relative: 12 %
Neutro Abs: 4.6 10*3/uL (ref 1.7–7.7)
Neutrophils Relative %: 67 %
Platelets: 191 10*3/uL (ref 150–400)
RBC: 3.75 MIL/uL — ABNORMAL LOW (ref 3.87–5.11)
RDW: 17.3 % — ABNORMAL HIGH (ref 11.5–15.5)
WBC: 6.7 10*3/uL (ref 4.0–10.5)
nRBC: 0 % (ref 0.0–0.2)

## 2020-12-29 LAB — BASIC METABOLIC PANEL
Anion gap: 7 (ref 5–15)
BUN: 23 mg/dL (ref 8–23)
CO2: 28 mmol/L (ref 22–32)
Calcium: 8.6 mg/dL — ABNORMAL LOW (ref 8.9–10.3)
Chloride: 105 mmol/L (ref 98–111)
Creatinine, Ser: 1.43 mg/dL — ABNORMAL HIGH (ref 0.44–1.00)
GFR, Estimated: 35 mL/min — ABNORMAL LOW (ref >60)
Glucose, Bld: 104 mg/dL — ABNORMAL HIGH (ref 70–99)
Potassium: 4.1 mmol/L (ref 3.5–5.1)
Sodium: 140 mmol/L (ref 135–145)

## 2020-12-29 MED ORDER — CLONIDINE HCL 0.1 MG PO TABS
0.1000 mg | ORAL_TABLET | Freq: Once | ORAL | Status: AC
  Administered 2020-12-29: 0.1 mg via ORAL
  Filled 2020-12-29: qty 1

## 2020-12-29 MED ORDER — DOXYCYCLINE HYCLATE 100 MG PO CAPS: 100.0000 mg | ORAL_CAPSULE | Freq: Two times a day (BID) | ORAL | 0 refills | Status: DC

## 2020-12-29 MED ORDER — DOXYCYCLINE HYCLATE 100 MG PO TABS
100.0000 mg | ORAL_TABLET | Freq: Once | ORAL | Status: AC
  Administered 2020-12-29: 100 mg via ORAL

## 2020-12-29 MED ORDER — RIVAROXABAN (XARELTO) VTE STARTER PACK (15 & 20 MG): ORAL_TABLET | ORAL | 0 refills | Status: DC

## 2020-12-29 MED ORDER — SODIUM CHLORIDE 0.9 % IV BOLUS
500.0000 mL | Freq: Once | INTRAVENOUS | Status: AC
  Administered 2020-12-29: 500 mL via INTRAVENOUS

## 2020-12-29 MED ORDER — RIVAROXABAN 15 MG PO TABS
15.0000 mg | ORAL_TABLET | Freq: Once | ORAL | Status: AC
  Administered 2020-12-29: 15 mg via ORAL
  Filled 2020-12-29: qty 1

## 2020-12-29 NOTE — Progress Notes (Signed)
Left lower extremity venous duplex has been completed. Preliminary results can be found in CV Proc through chart review.  Results were given to Dr. Silverio Lay.  12/29/20 3:31 PM Olen Cordial RVT

## 2020-12-29 NOTE — ED Notes (Signed)
Pt provided verbal consent to update family as to plan of care.

## 2020-12-29 NOTE — Discharge Instructions (Addendum)
You have a blood clot in your left leg and may be mild cellulitis.   Please take Xarelto as prescribed and also doxycycline as prescribed  You need to repeat your kidney function in a week with your doctor.  Your CT scan today did not show any fractures or bleeding  Return to ER if you have fever, worse leg pain or swelling, trouble breathing   Information on my medicine - XARELTO (rivaroxaban)  This medication education was reviewed with me or my healthcare representative as part of my discharge preparation.  The pharmacist that spoke with me during my hospital stay was:  Lynden Ang, [facility]  WHY WAS Carlena Hurl PRESCRIBED FOR YOU? Xarelto was prescribed to treat blood clots that may have been found in the veins of your legs (deep vein thrombosis) or in your lungs (pulmonary embolism) and to reduce the risk of them occurring again.  What do you need to know about Xarelto? The starting dose is one 15 mg tablet taken TWICE daily with food for the FIRST 21 DAYS then on 01/20/2021  the dose is changed to one 20 mg tablet taken ONCE A DAY with your evening meal.  DO NOT stop taking Xarelto without talking to the health care provider who prescribed the medication.  Refill your prescription for 20 mg tablets before you run out.  After discharge, you should have regular check-up appointments with your healthcare provider that is prescribing your Xarelto.  In the future your dose may need to be changed if your kidney function changes by a significant amount.  What do you do if you miss a dose? If you are taking Xarelto TWICE DAILY and you miss a dose, take it as soon as you remember. You may take two 15 mg tablets (total 30 mg) at the same time then resume your regularly scheduled 15 mg twice daily the next day.  If you are taking Xarelto ONCE DAILY and you miss a dose, take it as soon as you remember on the same day then continue your regularly scheduled once daily regimen the next day. Do not  take two doses of Xarelto at the same time.   Important Safety Information Xarelto is a blood thinner medicine that can cause bleeding. You should call your healthcare provider right away if you experience any of the following: Bleeding from an injury or your nose that does not stop. Unusual colored urine (red or dark brown) or unusual colored stools (red or black). Unusual bruising for unknown reasons. A serious fall or if you hit your head (even if there is no bleeding).  Some medicines may interact with Xarelto and might increase your risk of bleeding while on Xarelto. To help avoid this, consult your healthcare provider or pharmacist prior to using any new prescription or non-prescription medications, including herbals, vitamins, non-steroidal anti-inflammatory drugs (NSAIDs) and supplements.  This website has more information on Xarelto: VisitDestination.com.br.

## 2020-12-29 NOTE — ED Provider Notes (Signed)
[name] Provider Note   CSN: [id number] Arrival date & time: 12/29/20  1434     History Chief Complaint  Patient presents with   [name]    [name] is a 87 y.o. female hx of HTN, dementia, here presenting with fall.  Patient was noted to walk into someone else's room and fell and hit the back of her head.  Patient is demented and unable to give me much details. She told me that her fall was 2 days ago and since then, her left leg has been swollen.  Patient denies passing out.  Denies any chest pain or shortness of breath prior to the fall.  Patient had an admission 3 months ago for AKI and fall.  Patient is demented and unable to give much history  The history is provided by the patient and the EMS personnel.  Level V caveat- dementia     Past Medical History:  Diagnosis Date   Dementia (HCC)    DVT, lower extremity (HCC)    Hemorrhoids    Hypertension    Hypothyroidism    Thyroid disease     Patient Active Problem List   Diagnosis Date Noted   Concussion with no loss of consciousness    Fall 10/07/2020   Prolonged QT interval 10/07/2020   Dementia (HCC) 10/07/2020   CKD (chronic kidney disease), stage III (HCC) 10/07/2020   Hypothyroidism 10/07/2020   Head contusion 10/07/2020   Hypertensive urgency 10/07/2020   Acute bronchitis with asthma 05/09/2015   Acute bronchitis 05/09/2015   Essential hypertension 06/23/2014   Hyperlipidemia 06/23/2014   Bilateral lower extremity edema 06/23/2014   HYPOTHYROIDISM, POST-RADIATION 10/21/2007   OSTEOARTHRITIS 07/20/2007   DEEP VENOUS THROMBOPHLEBITIS, HX OF 07/20/2007    Past Surgical History:  Procedure Laterality Date   ABDOMINAL HYSTERECTOMY     APPENDECTOMY     CHOLECYSTECTOMY     JOINT REPLACEMENT     left knee   NO PAST SURGERIES     right knee replacement     TUBAL LIGATION       OB History   No obstetric history on file.     Family History  Problem  Relation Age of Onset   Cancer Father     Social History   Tobacco Use   Smoking status: Never    Passive exposure: Never   Smokeless tobacco: Never  Vaping Use   Vaping Use: Never used  Substance Use Topics   Alcohol use: No   Drug use: Never    Home Medications Prior to Admission medications   Medication Sig Start Date End Date Taking? Authorizing Provider  ALPRAZolam (XANAX) 0.25 MG tablet Take 1 tablet (0.25 mg total) by mouth 2 (two) times daily as needed for anxiety (Anxiety or delirium). Patient taking differently: Take 0.25 mg by mouth every 12 (twelve) hours as needed for anxiety. 10/12/20  Yes Rolly Salter, MD  amLODipine (NORVASC) 2.5 MG tablet Take 1 tablet (2.5 mg total) by mouth daily. 10/13/20  Yes Rolly Salter, MD  docusate sodium (COLACE) 100 MG capsule Take 1 capsule (100 mg total) by mouth 2 (two) times daily. 10/12/20  Yes Rolly Salter, MD  latanoprost (XALATAN) 0.005 % ophthalmic solution Place 1 drop into both eyes at bedtime. 09/05/20  Yes [provider]  metoprolol succinate (TOPROL-XL) 100 MG 24 hr tablet Take 100 mg by mouth daily. 06/03/20  Yes [provider]  QUEtiapine (SEROQUEL) 25 MG tablet  Take 25 mg by mouth 2 (two) times daily. 12/25/20  Yes [provider]  rosuvastatin (CRESTOR) 10 MG tablet Take 10 mg by mouth 2 (two) times a week. 06/03/20  Yes [provider]  vitamin B-12 (CYANOCOBALAMIN) 1000 MCG tablet Take 1,000 mcg by mouth daily.   Yes [provider]  cloNIDine (CATAPRES - DOSED IN MG/24 HR) 0.1 mg/24hr patch Place 1 patch (0.1 mg total) onto the skin once a week. Patient not taking: Reported on 12/29/2020 10/16/20   Rolly Salter, MD  levothyroxine (SYNTHROID) 125 MCG tablet Take 1 tablet (125 mcg total) by mouth every morning. Patient not taking: Reported on 12/29/2020 10/13/20   Rolly Salter, MD  levothyroxine (SYNTHROID) 150 MCG tablet Take 150 mcg by mouth every morning. Patient  not taking: No sig reported 12/21/20   [provider]  levothyroxine (SYNTHROID) 175 MCG tablet Take 175 mcg by mouth daily before breakfast. Patient not taking: Reported on 12/29/2020    [provider]    Allergies    Contrast media [iodinated diagnostic agents], Elemental sulfur, Penicillins, Penicillins, Codeine, Cortisone, and Sulfa antibiotics  Review of Systems   Review of Systems  Skin:  Positive for color change.  Psychiatric/Behavioral:  Positive for confusion.   All other systems reviewed and are negative.  Physical Exam Updated Vital Signs BP (!) 183/66   Pulse (!) 51   Temp 97.8 F (36.6 C) (Oral)   Resp 17   SpO2 99%   Physical Exam Vitals and nursing note reviewed.  Constitutional:      Comments: Confused  HENT:     Head:     Comments: Posterior scalp hematoma with no obvious laceration    Nose: Nose normal.     Mouth/Throat:     Mouth: Mucous membranes are moist.  Eyes:     Extraocular Movements: Extraocular movements intact.     Pupils: Pupils are equal, round, and reactive to light.  Cardiovascular:     Rate and Rhythm: Normal rate and regular rhythm.     Pulses: Normal pulses.     Heart sounds: Normal heart sounds.  Pulmonary:     Effort: Pulmonary effort is normal.     Breath sounds: Normal breath sounds.  Abdominal:     General: Abdomen is flat.     Palpations: Abdomen is soft.  Musculoskeletal:     Cervical back: Normal range of motion and neck supple.     Comments: Left leg slightly swollen and 1+ edema.  Patient also has some overlying cellulitis versus venous stasis changes.  No obvious spinal tenderness.  Normal range of motion of bilateral hips.  Skin:    General: Skin is warm.     Capillary Refill: Capillary refill takes less than 2 seconds.     Findings: Erythema present.     Comments: Erythema left lower leg  Neurological:     Comments: Demented, moving all extremities  Psychiatric:        Mood and Affect: Mood  normal.    ED Results / Procedures / Treatments   Labs (all labs ordered are listed, but only abnormal results are displayed) Labs Reviewed  CBC WITH DIFFERENTIAL/PLATELET - Abnormal; Notable for the following components:      Result Value   RBC 3.75 (*)    Hemoglobin 11.6 (*)    RDW 17.3 (*)    All other components within normal limits  BASIC METABOLIC PANEL - Abnormal; Notable for the following components:  Glucose, Bld 104 (*)    Creatinine, Ser 1.43 (*)    Calcium 8.6 (*)    GFR, Estimated 35 (*)    All other components within normal limits    EKG None  Radiology DG Chest 1 View  Result Date: 12/29/2020 CLINICAL DATA:  Status post fall. EXAM: CHEST  1 VIEW COMPARISON:  October 07, 2020 FINDINGS: The lungs are hyperinflated. There is no evidence of acute infiltrate, pleural effusion or pneumothorax. The heart size and mediastinal contours are within normal limits. Mild to moderate severity calcification of the aortic arch is noted. The visualized skeletal structures are unremarkable. IMPRESSION: Stable exam without acute cardiopulmonary disease. Electronically Signed   By: Aram Candela M.D.   On: 12/29/2020 16:00   DG Pelvis 1-2 Views  Result Date: 12/29/2020 CLINICAL DATA:  Status post fall. EXAM: PELVIS - 1-2 VIEW COMPARISON:  October 07, 2020 FINDINGS: There is no evidence of pelvic fracture or diastasis. No pelvic bone lesions are seen. Degenerative changes are seen involving the bilateral hips and visualized portion of the lumbar spine. Radiopaque surgical clips are seen within the right upper quadrant. IMPRESSION: No acute findings. Electronically Signed   By: Aram Candela M.D.   On: 12/29/2020 15:58   DG Tibia/Fibula Left  Result Date: 12/29/2020 CLINICAL DATA:  Status post fall. EXAM: LEFT TIBIA AND FIBULA - 2 VIEW COMPARISON:  December 21, 2015 FINDINGS: There is no evidence of acute fracture or other focal bone lesions. The femoral component of a left knee  replacement is seen without evidence of surrounding lucency to suggest the presence of hardware loosening or infection. Prior removal of the tibial component is noted. Soft tissues are unremarkable. IMPRESSION: 1. No acute osseous abnormality. 2. Left knee replacement without evidence of hardware loosening or infection. Electronically Signed   By: Aram Candela M.D.   On: 12/29/2020 16:02   CT HEAD WO CONTRAST ( )  Result Date: 12/29/2020 CLINICAL DATA:  Status post fall. EXAM: CT HEAD WITHOUT CONTRAST TECHNIQUE: Contiguous axial images were obtained from the base of the skull through the vertex without intravenous contrast. COMPARISON:  October 07, 2020 FINDINGS: Brain: There is moderate severity cerebral atrophy with widening of the extra-axial spaces and ventricular dilatation. There are areas of decreased attenuation within the white matter tracts of the supratentorial brain, consistent with microvascular disease changes. Vascular: No hyperdense vessel or unexpected calcification. Skull: Normal. Negative for fracture or focal lesion. Sinuses/Orbits: No acute finding. Other: None. IMPRESSION: 1. Generalized cerebral atrophy. 2. No acute intracranial abnormality. Electronically Signed   By: Aram Candela M.D.   On: 12/29/2020 16:06   CT Cervical Spine Wo Contrast  Result Date: 12/29/2020 CLINICAL DATA:  Status post fall. EXAM: CT CERVICAL SPINE WITHOUT CONTRAST TECHNIQUE: Multidetector CT imaging of the cervical spine was performed without intravenous contrast. Multiplanar CT image reconstructions were also generated. COMPARISON:  October 07, 2020 FINDINGS: Alignment: Approximately 2 mm anterolisthesis of the C3 vertebral body on C4, with approximately 1 mm anterolisthesis of C4 on C5. 1 mm anterolisthesis of C7 is seen on T1. Skull base and vertebrae: No acute fracture. No primary bone lesion or focal pathologic process. Soft tissues and spinal canal: No prevertebral fluid or swelling. No visible  canal hematoma. Disc levels: Mild to moderate severity endplate sclerosis is seen at the levels of C3-C4 C4-C5. Marked severity endplate sclerosis and associated anterior osteophyte formation is seen at the levels of C5-C6, C6-C7 and C7-T1. Marked severity intervertebral disc space narrowing is  seen at the levels of C3-C4, C4-C5, C5-C6, C6-C7 and C7-T1. Bilateral moderate to marked severity facet joint hypertrophy is noted. Upper chest: Stable right apical scarring and/or atelectasis is seen. Other: None. IMPRESSION: 1. No acute osseous abnormality. 2. Marked severity multilevel degenerative changes, as described above. 3. Stable anterolisthesis at the levels of C3-C4, C4-C5 and C7-T1. Electronically Signed   By: Aram Candela M.D.   On: 12/29/2020 16:18   VAS [place] LOWER EXTREMITY VENOUS (DVT) (ONLY [facility] & [facility])  Result Date: 12/29/2020  Lower Venous DVT Study Patient Name:  RAELEE ROSSMANN  Date of Exam:   12/29/2020 Medical Rec #: [id number]           Accession #:    [id number] Date of Birth: [birth year]           Patient Gender: F Patient Age:   76 years Exam Location:  [facility] Procedure:      VAS [place] LOWER EXTREMITY VENOUS (DVT) Referring Phys: Gil Ingwersen --------------------------------------------------------------------------------  Indications: Edema.  Risk Factors: None identified. Limitations: Body habitus, poor ultrasound/tissue interface and patient positioning. Comparison Study: 10/09/2020 - RIGHT:                   - There is no evidence of deep vein thrombosis in the lower                   extremity.                    - No cystic structure found in the popliteal fossa.                    LEFT:                   - No evidence of acute deep vein thrombosis in the left lower                   extremity.                   The posterior tibial and peroenal veins appear patent with                   some chronic                   changes noted. Performing Technologist: Chanda Busing RVT   Examination Guidelines: A complete evaluation includes B-mode imaging, spectral Doppler, color Doppler, and power Doppler as needed of all accessible portions of each vessel. Bilateral testing is considered an integral part of a complete examination. Limited examinations for reoccurring indications may be performed as noted. The reflux portion of the exam is performed with the patient in reverse Trendelenburg.  +-----+---------------+---------+-----------+----------+--------------+ RIGHTCompressibilityPhasicitySpontaneityPropertiesThrombus Aging +-----+---------------+---------+-----------+----------+--------------+ CFV  Full           Yes      Yes                                 +-----+---------------+---------+-----------+----------+--------------+   +---------+---------------+---------+-----------+----------+-------------------+ LEFT     CompressibilityPhasicitySpontaneityPropertiesThrombus Aging      +---------+---------------+---------+-----------+----------+-------------------+ CFV      Full           Yes      Yes                                      +---------+---------------+---------+-----------+----------+-------------------+  SFJ      Full                                                             +---------+---------------+---------+-----------+----------+-------------------+ FV Prox  Partial        Yes      Yes                  Age Indeterminate   +---------+---------------+---------+-----------+----------+-------------------+ FV Mid   Partial        Yes      Yes                  Age Indeterminate   +---------+---------------+---------+-----------+----------+-------------------+ FV Distal               Yes      Yes                                      +---------+---------------+---------+-----------+----------+-------------------+ PFV      Full                                                              +---------+---------------+---------+-----------+----------+-------------------+ POP      Partial        Yes      Yes                  Age Indeterminate   +---------+---------------+---------+-----------+----------+-------------------+ PTV                                                   Not well visualized +---------+---------------+---------+-----------+----------+-------------------+ PERO                                                  Not well visualized +---------+---------------+---------+-----------+----------+-------------------+     Summary: RIGHT: - No evidence of common femoral vein obstruction.  LEFT: - Findings consistent with age indeterminate deep vein thrombosis involving the left femoral vein, and left popliteal vein. - No cystic structure found in the popliteal fossa.  *See table(s) above for measurements and observations. Electronically signed by Sherald Hess MD on 12/29/2020 at 3:37:50 PM.    Final     Procedures Procedures   Medications Ordered in ED Medications  sodium chloride 0.9 % bolus 500 mL (has no administration in time range)  cloNIDine (CATAPRES) tablet 0.1 mg (0.1 mg Oral Given 12/29/20 1732)  Rivaroxaban (XARELTO) tablet 15 mg (15 mg Oral Given 12/29/20 1732)    ED Course  I have reviewed the triage vital signs and the nursing notes.  Pertinent labs & imaging results that were available during my care of the patient were reviewed by me and considered in my medical decision making (see chart for details).    MDM Rules/Calculators/A&P  [name] is a 87 y.o. female history of dementia here presenting with a fall.  Patient also has left leg swelling likely from venous stasis versus cellulitis versus DVT. Will get CT head and neck and x-rays and labs and left leg DVT  5:58 PM CT head and neck is unremarkable.  Creatinine is 1.4 which is slightly increased compared to baseline. Patient's DVT study is  positive on the left popliteal and femoral.  Patient has no signs of PE.  Discussed with pharmacy and patient can get Xarelto starter pack at regular dose.  Stable for discharge back to facility   Final Clinical Impression(s) / ED Diagnoses Final diagnoses:  None    Rx / DC Orders ED Discharge Orders     None        Charlynne Pander, MD 12/29/20 1758

## 2020-12-29 NOTE — ED Notes (Signed)
Pts husband, Gerlene Burdock, provided update as to pts plan of care. All questions addressed.

## 2020-12-29 NOTE — ED Triage Notes (Signed)
Pt BIBA from [facility]-  Pt ambulated into another pt room and fell back, pt claims "hit her head" on hardwood floor. Pt does not take blood thinners. No head trauma. According to staff, pt is cognitively at baseline.

## 2020-12-29 NOTE — ED Notes (Signed)
Pt ambulatory to bathroom, one person assistance with walker.

## 2021-01-27 ENCOUNTER — Emergency Department (HOSPITAL_COMMUNITY): Payer: Medicare Other

## 2021-01-27 ENCOUNTER — Inpatient Hospital Stay (HOSPITAL_COMMUNITY)
Admission: EM | Admit: 2021-01-27 | Discharge: 2021-02-02 | DRG: 481 | Disposition: A | Discharge status: Hospice | Payer: Medicare Other | Source: Skilled Nursing Facility | Attending: Internal Medicine | Admitting: Internal Medicine

## 2021-01-27 ENCOUNTER — Encounter (HOSPITAL_COMMUNITY): Payer: Self-pay | Admitting: Emergency Medicine

## 2021-01-27 DIAGNOSIS — W07XXXA Fall from chair, initial encounter: Secondary | ICD-10-CM | POA: Diagnosis present

## 2021-01-27 DIAGNOSIS — I160 Hypertensive urgency: Secondary | ICD-10-CM | POA: Diagnosis present

## 2021-01-27 DIAGNOSIS — S0083XA Contusion of other part of head, initial encounter: Secondary | ICD-10-CM | POA: Diagnosis present

## 2021-01-27 DIAGNOSIS — Z20822 Contact with and (suspected) exposure to covid-19: Secondary | ICD-10-CM | POA: Diagnosis present

## 2021-01-27 DIAGNOSIS — N179 Acute kidney failure, unspecified: Secondary | ICD-10-CM | POA: Diagnosis present

## 2021-01-27 DIAGNOSIS — S72142A Displaced intertrochanteric fracture of left femur, initial encounter for closed fracture: Principal | ICD-10-CM | POA: Diagnosis present

## 2021-01-27 DIAGNOSIS — Z79890 Hormone replacement therapy (postmenopausal): Secondary | ICD-10-CM

## 2021-01-27 DIAGNOSIS — S72009A Fracture of unspecified part of neck of unspecified femur, initial encounter for closed fracture: Secondary | ICD-10-CM | POA: Diagnosis not present

## 2021-01-27 DIAGNOSIS — F03918 Unspecified dementia, unspecified severity, with other behavioral disturbance: Secondary | ICD-10-CM | POA: Diagnosis present

## 2021-01-27 DIAGNOSIS — R0602 Shortness of breath: Secondary | ICD-10-CM

## 2021-01-27 DIAGNOSIS — Z6824 Body mass index (BMI) 24.0-24.9, adult: Secondary | ICD-10-CM

## 2021-01-27 DIAGNOSIS — Z880 Allergy status to penicillin: Secondary | ICD-10-CM

## 2021-01-27 DIAGNOSIS — I129 Hypertensive chronic kidney disease with stage 1 through stage 4 chronic kidney disease, or unspecified chronic kidney disease: Secondary | ICD-10-CM | POA: Diagnosis present

## 2021-01-27 DIAGNOSIS — E039 Hypothyroidism, unspecified: Secondary | ICD-10-CM | POA: Diagnosis present

## 2021-01-27 DIAGNOSIS — D539 Nutritional anemia, unspecified: Secondary | ICD-10-CM | POA: Diagnosis present

## 2021-01-27 DIAGNOSIS — Z96653 Presence of artificial knee joint, bilateral: Secondary | ICD-10-CM | POA: Diagnosis present

## 2021-01-27 DIAGNOSIS — Y92238 Other place in hospital as the place of occurrence of the external cause: Secondary | ICD-10-CM | POA: Diagnosis present

## 2021-01-27 DIAGNOSIS — Z888 Allergy status to other drugs, medicaments and biological substances status: Secondary | ICD-10-CM

## 2021-01-27 DIAGNOSIS — Z7901 Long term (current) use of anticoagulants: Secondary | ICD-10-CM

## 2021-01-27 DIAGNOSIS — Z885 Allergy status to narcotic agent status: Secondary | ICD-10-CM

## 2021-01-27 DIAGNOSIS — Z91041 Radiographic dye allergy status: Secondary | ICD-10-CM

## 2021-01-27 DIAGNOSIS — E440 Moderate protein-calorie malnutrition: Secondary | ICD-10-CM | POA: Insufficient documentation

## 2021-01-27 DIAGNOSIS — W19XXXA Unspecified fall, initial encounter: Secondary | ICD-10-CM

## 2021-01-27 DIAGNOSIS — Z515 Encounter for palliative care: Secondary | ICD-10-CM

## 2021-01-27 DIAGNOSIS — W1830XA Fall on same level, unspecified, initial encounter: Secondary | ICD-10-CM | POA: Diagnosis present

## 2021-01-27 DIAGNOSIS — E785 Hyperlipidemia, unspecified: Secondary | ICD-10-CM | POA: Diagnosis present

## 2021-01-27 DIAGNOSIS — Z882 Allergy status to sulfonamides status: Secondary | ICD-10-CM

## 2021-01-27 DIAGNOSIS — J45909 Unspecified asthma, uncomplicated: Secondary | ICD-10-CM | POA: Diagnosis present

## 2021-01-27 DIAGNOSIS — N183 Chronic kidney disease, stage 3 unspecified: Secondary | ICD-10-CM | POA: Diagnosis present

## 2021-01-27 DIAGNOSIS — N1832 Chronic kidney disease, stage 3b: Secondary | ICD-10-CM | POA: Diagnosis present

## 2021-01-27 DIAGNOSIS — Y92129 Unspecified place in nursing home as the place of occurrence of the external cause: Secondary | ICD-10-CM

## 2021-01-27 DIAGNOSIS — D509 Iron deficiency anemia, unspecified: Secondary | ICD-10-CM | POA: Diagnosis present

## 2021-01-27 DIAGNOSIS — S72002A Fracture of unspecified part of neck of left femur, initial encounter for closed fracture: Secondary | ICD-10-CM | POA: Diagnosis present

## 2021-01-27 DIAGNOSIS — Z66 Do not resuscitate: Secondary | ICD-10-CM | POA: Diagnosis present

## 2021-01-27 DIAGNOSIS — F0390 Unspecified dementia without behavioral disturbance: Secondary | ICD-10-CM | POA: Diagnosis present

## 2021-01-27 DIAGNOSIS — Z79899 Other long term (current) drug therapy: Secondary | ICD-10-CM

## 2021-01-27 DIAGNOSIS — E875 Hyperkalemia: Secondary | ICD-10-CM | POA: Diagnosis present

## 2021-01-27 DIAGNOSIS — Z90710 Acquired absence of both cervix and uterus: Secondary | ICD-10-CM

## 2021-01-27 DIAGNOSIS — I447 Left bundle-branch block, unspecified: Secondary | ICD-10-CM | POA: Diagnosis present

## 2021-01-27 DIAGNOSIS — D62 Acute posthemorrhagic anemia: Secondary | ICD-10-CM | POA: Diagnosis not present

## 2021-01-27 DIAGNOSIS — I82412 Acute embolism and thrombosis of left femoral vein: Secondary | ICD-10-CM | POA: Diagnosis present

## 2021-01-27 DIAGNOSIS — Z9049 Acquired absence of other specified parts of digestive tract: Secondary | ICD-10-CM

## 2021-01-27 DIAGNOSIS — Z86718 Personal history of other venous thrombosis and embolism: Secondary | ICD-10-CM

## 2021-01-27 LAB — CBC
HCT: 37.9 % (ref 36.0–46.0)
Hemoglobin: 11.6 g/dL — ABNORMAL LOW (ref 12.0–15.0)
MCH: 31.1 pg (ref 26.0–34.0)
MCHC: 30.6 g/dL (ref 30.0–36.0)
MCV: 101.6 fL — ABNORMAL HIGH (ref 80.0–100.0)
Platelets: 227 10*3/uL (ref 150–400)
RBC: 3.73 MIL/uL — ABNORMAL LOW (ref 3.87–5.11)
RDW: 16.2 % — ABNORMAL HIGH (ref 11.5–15.5)
WBC: 7.7 10*3/uL (ref 4.0–10.5)
nRBC: 0 % (ref 0.0–0.2)

## 2021-01-27 LAB — COMPREHENSIVE METABOLIC PANEL
ALT: 11 U/L (ref 0–44)
AST: 28 U/L (ref 15–41)
Albumin: 3.4 g/dL — ABNORMAL LOW (ref 3.5–5.0)
Alkaline Phosphatase: 56 U/L (ref 38–126)
Anion gap: 8 (ref 5–15)
BUN: 40 mg/dL — ABNORMAL HIGH (ref 8–23)
CO2: 25 mmol/L (ref 22–32)
Calcium: 9.3 mg/dL (ref 8.9–10.3)
Chloride: 106 mmol/L (ref 98–111)
Creatinine, Ser: 1.54 mg/dL — ABNORMAL HIGH (ref 0.44–1.00)
GFR, Estimated: 32 mL/min — ABNORMAL LOW (ref >60)
Glucose, Bld: 101 mg/dL — ABNORMAL HIGH (ref 70–99)
Potassium: 4.9 mmol/L (ref 3.5–5.1)
Sodium: 139 mmol/L (ref 135–145)
Total Bilirubin: 0.4 mg/dL (ref 0.3–1.2)
Total Protein: 6.9 g/dL (ref 6.5–8.1)

## 2021-01-27 LAB — URINALYSIS, ROUTINE W REFLEX MICROSCOPIC
Bilirubin Urine: NEGATIVE
Glucose, UA: NEGATIVE mg/dL
Hgb urine dipstick: NEGATIVE
Ketones, ur: NEGATIVE mg/dL
Leukocytes,Ua: NEGATIVE
Nitrite: NEGATIVE
Protein, ur: NEGATIVE mg/dL
Specific Gravity, Urine: 1.008 (ref 1.005–1.030)
pH: 7 (ref 5.0–8.0)

## 2021-01-27 LAB — RESP PANEL BY RT-PCR (FLU A&B, COVID) ARPGX2
Influenza A by PCR: NEGATIVE
Influenza B by PCR: NEGATIVE
SARS Coronavirus 2 by RT PCR: NEGATIVE

## 2021-01-27 LAB — TROPONIN I (HIGH SENSITIVITY)
Troponin I (High Sensitivity): 14 ng/L (ref <18)
Troponin I (High Sensitivity): 12 ng/L (ref <18)

## 2021-01-27 LAB — PROTIME-INR
INR: 1.1 (ref 0.8–1.2)
Prothrombin Time: 14.5 seconds (ref 11.4–15.2)

## 2021-01-27 MED ORDER — LACTATED RINGERS IV BOLUS
500.0000 mL | Freq: Once | INTRAVENOUS | Status: AC
  Administered 2021-01-27: 500 mL via INTRAVENOUS

## 2021-01-27 MED ORDER — HYDROCODONE-ACETAMINOPHEN 5-325 MG PO TABS
1.0000 | ORAL_TABLET | Freq: Once | ORAL | Status: AC
  Administered 2021-01-27: 1 via ORAL
  Filled 2021-01-27: qty 1

## 2021-01-27 MED ORDER — KETOROLAC TROMETHAMINE 60 MG/2ML IM SOLN: 15.0000 mg | Freq: Once | INTRAMUSCULAR | Status: DC

## 2021-01-27 MED ORDER — HYDROCODONE-ACETAMINOPHEN 5-325 MG PO TABS: 1.0000 | ORAL_TABLET | Freq: Once | ORAL | Status: DC

## 2021-01-27 NOTE — Progress Notes (Signed)
   01/27/21 1733  Clinical Encounter Type  Visited With Patient not available  Visit Type Initial;Trauma  Referral From Nurse  Consult/Referral To Chaplain   Chaplain responded to Level 2 trauma. Pt being treated and no family present. Chaplain called to another Pt. Chaplain remains available.  This note was prepared by Paul Half, MDiv. Chaplain remains available as needed through the on-call pager: [phone].

## 2021-01-27 NOTE — ED Notes (Signed)
X-ray at bedside

## 2021-01-27 NOTE — ED Notes (Addendum)
Pt found on floor, lying against the door with a puddle of urine. EDP at bedside. Assisted pt back into bed with 3-person assist. Pt not reporting any new injuries. No new obvious injuries/hematomas. Shortening and rotation noted to left leg. Pt still stating vaginal pain. EDP requested bladder scan. Bladder scan 85ml. VSS. Consulting civil engineer notified. Pt moved in front of nursing station for closer monitoring.

## 2021-01-27 NOTE — Progress Notes (Signed)
Orthopedic Tech Progress Note Patient Details:  [name] 1929/03/28 [id number]  Level 2 trauma  Patient ID: [name], female   DOB: 14-Dec-1929, 87 y.o.   MRN: [id number]  Docia Furl 01/27/2021, 5:48 PM

## 2021-01-27 NOTE — ED Provider Notes (Signed)
[facility] EMERGENCY DEPARTMENT Provider Note   CSN: [id number] Arrival date & time: 01/27/21  1734     History Chief Complaint  Patient presents with   Fall    On thinners    [name] is a 87 y.o. female.  HPI Patient presents after an unwitnessed fall.  She currently lives in a skilled nursing facility.  She has dementia at baseline.  She was found on the ground and staff believe that she fell forward out of a chair.  She was noted to have a area of swelling to her forehead.  She was transported to the ED by EMS.  EMS vital signs notable for hypertension.  Patient is reportedly at her mental baseline.  She does take anticoagulation.  Per chart review, she was last seen in the ED 4 weeks ago after a fall.  At that time, she was found to have a DVT.  She was started on Xarelto.  On her paperwork, brought from skilled nursing facility, it does state that she is on warfarin, and not Xarelto.    Past Medical History:  Diagnosis Date   Dementia ([place])    DVT, lower extremity ([place])    Hemorrhoids    Hypertension    Hypothyroidism    Thyroid disease     Patient Active Problem List   Diagnosis Date Noted   Concussion with no loss of consciousness    Fall 10/07/2020   Prolonged QT interval 10/07/2020   Dementia ([place]) 10/07/2020   CKD (chronic kidney disease), stage III ([place]) 10/07/2020   Hypothyroidism 10/07/2020   Head contusion 10/07/2020   Hypertensive urgency 10/07/2020   Acute bronchitis with asthma 05/09/2015   Acute bronchitis 05/09/2015   Essential hypertension 06/23/2014   Hyperlipidemia 06/23/2014   Bilateral lower extremity edema 06/23/2014   HYPOTHYROIDISM, POST-RADIATION 10/21/2007   OSTEOARTHRITIS 07/20/2007   DEEP VENOUS THROMBOPHLEBITIS, HX OF 07/20/2007    Past Surgical History:  Procedure Laterality Date   ABDOMINAL HYSTERECTOMY     APPENDECTOMY     CHOLECYSTECTOMY     JOINT REPLACEMENT     left knee   NO PAST SURGERIES      right knee replacement     TUBAL LIGATION       OB History   No obstetric history on file.     Family History  Problem Relation Age of Onset   Cancer Father     Social History   Tobacco Use   Smoking status: Never    Passive exposure: Never   Smokeless tobacco: Never  Vaping Use   Vaping Use: Never used  Substance Use Topics   Alcohol use: No   Drug use: Never    Home Medications Prior to Admission medications   Medication Sig Start Date End Date Taking? Authorizing Provider  ALPRAZolam (XANAX) 0.25 MG tablet Take 1 tablet (0.25 mg total) by mouth 2 (two) times daily as needed for anxiety (Anxiety or delirium). Patient taking differently: Take 0.25 mg by mouth every 12 (twelve) hours as needed for anxiety. 10/12/20   Lavina Hamman, MD  amLODipine (NORVASC) 2.5 MG tablet Take 1 tablet (2.5 mg total) by mouth daily. 10/13/20   Lavina Hamman, MD  cloNIDine (CATAPRES - DOSED IN MG/24 HR) 0.1 mg/24hr patch Place 1 patch (0.1 mg total) onto the skin once a week. Patient not taking: Reported on 12/29/2020 10/16/20   Lavina Hamman, MD  docusate sodium (COLACE) 100 MG capsule Take 1 capsule (  100 mg total) by mouth 2 (two) times daily. 10/12/20   Lavina Hamman, MD  doxycycline (VIBRAMYCIN) 100 MG capsule Take 1 capsule (100 mg total) by mouth 2 (two) times daily. One po bid x 7 days 12/29/20   Drenda Freeze, MD  latanoprost (XALATAN) 0.005 % ophthalmic solution Place 1 drop into both eyes at bedtime. 09/05/20   [provider]  levothyroxine (SYNTHROID) 125 MCG tablet Take 1 tablet (125 mcg total) by mouth every morning. Patient not taking: Reported on 12/29/2020 10/13/20   Lavina Hamman, MD  levothyroxine (SYNTHROID) 150 MCG tablet Take 150 mcg by mouth every morning. Patient not taking: No sig reported 12/21/20   [provider]  levothyroxine (SYNTHROID) 175 MCG tablet Take 175 mcg by mouth daily before breakfast. Patient not taking: Reported on  12/29/2020    [provider]  metoprolol succinate (TOPROL-XL) 100 MG 24 hr tablet Take 100 mg by mouth daily. 06/03/20   [provider]  QUEtiapine (SEROQUEL) 25 MG tablet Take 25 mg by mouth 2 (two) times daily. 12/25/20   [provider]  RIVAROXABAN Alveda Reasons) VTE STARTER PACK (15 & 20 MG) Follow package directions: Take one 15mg  tablet by mouth twice a day. On day 22, switch to one 20mg  tablet once a day. Take with food. 12/29/20   Drenda Freeze, MD  rosuvastatin (CRESTOR) 10 MG tablet Take 10 mg by mouth 2 (two) times a week. 06/03/20   [provider]  vitamin B-12 (CYANOCOBALAMIN) 1000 MCG tablet Take 1,000 mcg by mouth daily.    [provider]    Allergies    Contrast media [iodinated diagnostic agents], Elemental sulfur, Penicillins, Penicillins, Codeine, Cortisone, and Sulfa antibiotics  Review of Systems   Review of Systems  Unable to perform ROS: Dementia   Physical Exam Updated Vital Signs BP (!) 190/88   Pulse 62   Temp 98.2 F (36.8 C) (Oral)   Resp (!) 21   Ht 5\' 7"  (1.702 m)   Wt 70.9 kg   SpO2 100%   BMI 24.48 kg/m   Physical Exam Vitals and nursing note reviewed. Exam conducted with a chaperone present.  Constitutional:      General: She is not in acute distress.    Appearance: Normal appearance. She is well-developed. She is not ill-appearing, toxic-appearing or diaphoretic.  HENT:     Head: Normocephalic.     Comments: Small hematoma to anterior forehead, midline of hairline    Right Ear: External ear normal.     Left Ear: External ear normal.     Nose: Nose normal.     Mouth/Throat:     Mouth: Mucous membranes are moist.     Pharynx: Oropharynx is clear.  Eyes:     Extraocular Movements: Extraocular movements intact.     Conjunctiva/sclera: Conjunctivae normal.  Neck:     Comments: Cervical collar in place Cardiovascular:     Rate and Rhythm: Normal rate and regular rhythm.     Heart sounds: No  murmur heard. Pulmonary:     Effort: Pulmonary effort is normal. No respiratory distress.     Breath sounds: Normal breath sounds. No wheezing.  Chest:     Chest wall: No tenderness.  Abdominal:     Palpations: Abdomen is soft.     Tenderness: There is no abdominal tenderness.  Musculoskeletal:     Cervical back: Neck supple.     Comments: Left leg appears to be shortened  Skin:  General: Skin is warm and dry.  Neurological:     General: No focal deficit present.     Mental Status: She is alert. Mental status is at baseline.     Cranial Nerves: No cranial nerve deficit.     Sensory: No sensory deficit.     Motor: No weakness.  Psychiatric:        Mood and Affect: Mood normal.        Behavior: Behavior normal.    ED Results / Procedures / Treatments   Labs (all labs ordered are listed, but only abnormal results are displayed) Labs Reviewed  COMPREHENSIVE METABOLIC PANEL - Abnormal; Notable for the following components:      Result Value   Glucose, Bld 101 (*)    BUN 40 (*)    Creatinine, Ser 1.54 (*)    Albumin 3.4 (*)    GFR, Estimated 32 (*)    All other components within normal limits  CBC - Abnormal; Notable for the following components:   RBC 3.73 (*)    Hemoglobin 11.6 (*)    MCV 101.6 (*)    RDW 16.2 (*)    All other components within normal limits  URINALYSIS, ROUTINE W REFLEX MICROSCOPIC - Abnormal; Notable for the following components:   Color, Urine COLORLESS (*)    All other components within normal limits  RESP PANEL BY RT-PCR (FLU A&B, COVID) ARPGX2  PROTIME-INR  TROPONIN I (HIGH SENSITIVITY)  TROPONIN I (HIGH SENSITIVITY)    EKG EKG Interpretation  Date/Time:  Saturday January 27 2021 17:45:51 EST Ventricular Rate:  93 PR Interval:  192 QRS Duration: 163 QT Interval:  479 QTC Calculation: 596 R Axis:   -38 Text Interpretation: Wandering atrial pacemaker LAE, consider biatrial enlargement Left bundle branch block Confirmed by Gloris Manchester  [phone]) on 01/27/2021 5:51:01 PM  Radiology CT Head Wo Contrast  Result Date: 01/28/2021 CLINICAL DATA:  Fall. EXAM: CT HEAD WITHOUT CONTRAST CT CERVICAL SPINE WITHOUT CONTRAST TECHNIQUE: Multidetector CT imaging of the head and cervical spine was performed following the standard protocol without intravenous contrast. Multiplanar CT image reconstructions of the cervical spine were also generated. COMPARISON:  CT head and cervical spine 01/27/2021. FINDINGS: CT HEAD FINDINGS Brain: No evidence of acute infarction, hemorrhage, hydrocephalus, extra-axial collection or mass lesion/mass effect. Again seen is mild diffuse atrophy. There is stable mild periventricular white matter hypodensity, likely chronic small vessel ischemic change. Vascular: Atherosclerotic calcifications are present within the cavernous internal carotid arteries. Skull: Normal. Negative for fracture or focal lesion. Sinuses/Orbits: No acute finding. Other: None. CT CERVICAL SPINE FINDINGS Alignment: There is stable grade 1 anterolisthesis at C3-C4 and C4-C5 which is favored as degenerative. Spinal alignment is otherwise within normal limits. Skull base and vertebrae: No acute fracture. No primary bone lesion or focal pathologic process. There is stable mild chronic compression deformity of T1. Soft tissues and spinal canal: No prevertebral fluid or swelling. No visible canal hematoma. Disc levels: Again seen is moderate severe disc space narrowing throughout the cervical spine with endplate osteophyte formation most significant at C5-C6. At C5-C6 there is some mild bilateral neural foraminal stenosis, unchanged. There is no significant central canal stenosis identified at any level. Upper chest: Negative. Other: None. IMPRESSION: No acute intracranial process. No acute fracture or traumatic subluxation of the cervical spine. Electronically Signed   By: Darliss Cheney M.D.   On: 01/28/2021 00:01   CT HEAD WO CONTRAST  Result Date:  01/27/2021 CLINICAL DATA:  Head trauma, minor (  Age >= 65y); Neck trauma (Age >= 65y) EXAM: CT HEAD WITHOUT CONTRAST CT CERVICAL SPINE WITHOUT CONTRAST TECHNIQUE: Multidetector CT imaging of the head and cervical spine was performed following the standard protocol without intravenous contrast. Multiplanar CT image reconstructions of the cervical spine were also generated. COMPARISON:  12/29/2020 FINDINGS: CT HEAD FINDINGS Brain: Normal anatomic configuration. Parenchymal volume loss is commensurate with the patient's age. Mild periventricular white matter changes are present likely reflecting the sequela of small vessel ischemia. No abnormal intra or extra-axial mass lesion or fluid collection. No abnormal mass effect or midline shift. No evidence of acute intracranial hemorrhage or infarct. Ventricular size is normal. Cerebellum unremarkable. Vascular: No asymmetric hyperdense vasculature at the skull base. Advanced vascular calcifications are noted within the carotid siphons and terminal vertebral arteries. Skull: Intact Sinuses/Orbits: Paranasal sinuses are clear. The ocular lenses have been removed. Orbits are otherwise unremarkable. Other: Mastoid air cells and middle ear cavities are clear. CT CERVICAL SPINE FINDINGS Alignment: There is straightening of the cervical spine, unchanged from prior examination. There is stable anterolisthesis of C3 upon C4 and C4 upon C5 of approximately 2-3 mm. Stable retrolisthesis of C5 upon C6 of 3 mm. Skull base and vertebrae: Craniocervical alignment is normal. The atlantodental interval is not widened. There are advanced degenerative changes involving the atlantodental articulation as well as the right atlantoaxial articulation. Moderate degenerative arthritis noted involving the occipitoatlantal articulation bilaterally as well as the left atlantoaxial articulation. No acute fracture of the cervical spine. Remote superior endplate fracture of T1 with mild loss of height is  unchanged. There is again noted ankylosis of the right C2-3, C4-5, and C7-T1 facets as well as the left C2-3 and C4-5 facet joints. Soft tissues and spinal canal: No prevertebral fluid or swelling. No visible canal hematoma. No significant canal stenosis. Disc levels: There is marked intervertebral disc space narrowing and endplate remodeling throughout the cervical spine, most severe at C4-C7 in keeping with changes of moderate to severe degenerative disc disease. The prevertebral soft tissues are not thickened on sagittal reformats. Review of the axial images demonstrates multilevel advanced uncovertebral and facet arthrosis resulting in mild-to-moderate left neuroforaminal narrowing at C3-4, mild right and moderate left neuroforaminal narrowing at C5-6. Upper chest: Asymmetric right apical parenchymal scarring. Moderate atherosclerotic calcification within the right carotid bifurcation. Other: None IMPRESSION: No acute intracranial injury.  No calvarial fracture. No acute fracture or listhesis of the cervical spine. Electronically Signed   By: Fidela Salisbury M.D.   On: 01/27/2021 19:51   CT Cervical Spine Wo Contrast  Result Date: 01/28/2021 CLINICAL DATA:  Fall. EXAM: CT HEAD WITHOUT CONTRAST CT CERVICAL SPINE WITHOUT CONTRAST TECHNIQUE: Multidetector CT imaging of the head and cervical spine was performed following the standard protocol without intravenous contrast. Multiplanar CT image reconstructions of the cervical spine were also generated. COMPARISON:  CT head and cervical spine 01/27/2021. FINDINGS: CT HEAD FINDINGS Brain: No evidence of acute infarction, hemorrhage, hydrocephalus, extra-axial collection or mass lesion/mass effect. Again seen is mild diffuse atrophy. There is stable mild periventricular white matter hypodensity, likely chronic small vessel ischemic change. Vascular: Atherosclerotic calcifications are present within the cavernous internal carotid arteries. Skull: Normal. Negative for  fracture or focal lesion. Sinuses/Orbits: No acute finding. Other: None. CT CERVICAL SPINE FINDINGS Alignment: There is stable grade 1 anterolisthesis at C3-C4 and C4-C5 which is favored as degenerative. Spinal alignment is otherwise within normal limits. Skull base and vertebrae: No acute fracture. No primary bone lesion or focal pathologic process.  There is stable mild chronic compression deformity of T1. Soft tissues and spinal canal: No prevertebral fluid or swelling. No visible canal hematoma. Disc levels: Again seen is moderate severe disc space narrowing throughout the cervical spine with endplate osteophyte formation most significant at C5-C6. At C5-C6 there is some mild bilateral neural foraminal stenosis, unchanged. There is no significant central canal stenosis identified at any level. Upper chest: Negative. Other: None. IMPRESSION: No acute intracranial process. No acute fracture or traumatic subluxation of the cervical spine. Electronically Signed   By: Ronney Asters M.D.   On: 01/28/2021 00:01   CT CERVICAL SPINE WO CONTRAST  Result Date: 01/27/2021 CLINICAL DATA:  Head trauma, minor (Age >= 65y); Neck trauma (Age >= 65y) EXAM: CT HEAD WITHOUT CONTRAST CT CERVICAL SPINE WITHOUT CONTRAST TECHNIQUE: Multidetector CT imaging of the head and cervical spine was performed following the standard protocol without intravenous contrast. Multiplanar CT image reconstructions of the cervical spine were also generated. COMPARISON:  12/29/2020 FINDINGS: CT HEAD FINDINGS Brain: Normal anatomic configuration. Parenchymal volume loss is commensurate with the patient's age. Mild periventricular white matter changes are present likely reflecting the sequela of small vessel ischemia. No abnormal intra or extra-axial mass lesion or fluid collection. No abnormal mass effect or midline shift. No evidence of acute intracranial hemorrhage or infarct. Ventricular size is normal. Cerebellum unremarkable. Vascular: No  asymmetric hyperdense vasculature at the skull base. Advanced vascular calcifications are noted within the carotid siphons and terminal vertebral arteries. Skull: Intact Sinuses/Orbits: Paranasal sinuses are clear. The ocular lenses have been removed. Orbits are otherwise unremarkable. Other: Mastoid air cells and middle ear cavities are clear. CT CERVICAL SPINE FINDINGS Alignment: There is straightening of the cervical spine, unchanged from prior examination. There is stable anterolisthesis of C3 upon C4 and C4 upon C5 of approximately 2-3 mm. Stable retrolisthesis of C5 upon C6 of 3 mm. Skull base and vertebrae: Craniocervical alignment is normal. The atlantodental interval is not widened. There are advanced degenerative changes involving the atlantodental articulation as well as the right atlantoaxial articulation. Moderate degenerative arthritis noted involving the occipitoatlantal articulation bilaterally as well as the left atlantoaxial articulation. No acute fracture of the cervical spine. Remote superior endplate fracture of T1 with mild loss of height is unchanged. There is again noted ankylosis of the right C2-3, C4-5, and C7-T1 facets as well as the left C2-3 and C4-5 facet joints. Soft tissues and spinal canal: No prevertebral fluid or swelling. No visible canal hematoma. No significant canal stenosis. Disc levels: There is marked intervertebral disc space narrowing and endplate remodeling throughout the cervical spine, most severe at C4-C7 in keeping with changes of moderate to severe degenerative disc disease. The prevertebral soft tissues are not thickened on sagittal reformats. Review of the axial images demonstrates multilevel advanced uncovertebral and facet arthrosis resulting in mild-to-moderate left neuroforaminal narrowing at C3-4, mild right and moderate left neuroforaminal narrowing at C5-6. Upper chest: Asymmetric right apical parenchymal scarring. Moderate atherosclerotic calcification  within the right carotid bifurcation. Other: None IMPRESSION: No acute intracranial injury.  No calvarial fracture. No acute fracture or listhesis of the cervical spine. Electronically Signed   By: Fidela Salisbury M.D.   On: 01/27/2021 19:51   CT T-SPINE NO CHARGE  Result Date: 01/28/2021 CLINICAL DATA:  Fall EXAM: CT Thoracic and Lumbar spine without contrast TECHNIQUE: Multiplanar CT images of the thoracic and lumbar spine were reconstructed from contemporary CT of the Chest, Abdomen, and Pelvis CONTRAST:  None COMPARISON:  None. FINDINGS:  CT THORACIC SPINE FINDINGS Alignment: Normal thoracic kyphosis. Vertebrae: No acute fracture or focal pathologic process. Paraspinal and other soft tissues: Better evaluated on dedicated CT chest. Disc levels: Mild degenerative changes of the mid/lower thoracic spine. Superior endplate Schmorl's node deformity at T11. Spinal canal is patent. CT LUMBAR SPINE FINDINGS Segmentation: 5 lumbar type vertebral bodies. Alignment: Normal lumbar lordosis. Vertebrae: No acute fracture or focal pathologic process. Paraspinal and other soft tissues: Better evaluated on dedicated CT abdomen/pelvis. Disc levels: Mild multilevel degenerative changes, most prominent at L3-4. Spinal canal is patent. IMPRESSION: No evidence of traumatic injury to the thoracolumbar spine. Mild multilevel degenerative changes, as above. Electronically Signed   By: Julian Hy M.D.   On: 01/28/2021 00:14   CT L-SPINE NO CHARGE  Result Date: 01/28/2021 CLINICAL DATA:  Fall EXAM: CT Thoracic and Lumbar spine without contrast TECHNIQUE: Multiplanar CT images of the thoracic and lumbar spine were reconstructed from contemporary CT of the Chest, Abdomen, and Pelvis CONTRAST:  None COMPARISON:  None. FINDINGS: CT THORACIC SPINE FINDINGS Alignment: Normal thoracic kyphosis. Vertebrae: No acute fracture or focal pathologic process. Paraspinal and other soft tissues: Better evaluated on dedicated CT chest. Disc  levels: Mild degenerative changes of the mid/lower thoracic spine. Superior endplate Schmorl's node deformity at T11. Spinal canal is patent. CT LUMBAR SPINE FINDINGS Segmentation: 5 lumbar type vertebral bodies. Alignment: Normal lumbar lordosis. Vertebrae: No acute fracture or focal pathologic process. Paraspinal and other soft tissues: Better evaluated on dedicated CT abdomen/pelvis. Disc levels: Mild multilevel degenerative changes, most prominent at L3-4. Spinal canal is patent. IMPRESSION: No evidence of traumatic injury to the thoracolumbar spine. Mild multilevel degenerative changes, as above. Electronically Signed   By: Julian Hy M.D.   On: 01/28/2021 00:14   DG Chest Port 1 View  Result Date: 01/27/2021 CLINICAL DATA:  Post fall with left hip pain. EXAM: PORTABLE CHEST 1 VIEW COMPARISON:  12/29/2020 FINDINGS: Stable upper normal heart size. Unchanged mediastinal contours with aortic atherosclerosis. Mitral annulus calcifications. No pneumothorax, pleural effusion, or focal airspace disease. No pulmonary edema. The bones are under mineralized without acute osseous abnormalities. IMPRESSION: No acute chest findings. Electronically Signed   By: Keith Rake M.D.   On: 01/27/2021 18:28   DG Knee Left Port  Result Date: 01/27/2021 CLINICAL DATA:  Fall. EXAM: LEFT FEMUR PORTABLE 1 VIEW; PORTABLE LEFT KNEE - 1-2 VIEW COMPARISON:  The left knee x-ray 12/31/2015. FINDINGS: Left knee: Left knee arthroplasty appears in anatomic alignment. There is no evidence for hardware loosening or acute fracture. There is no significant joint effusion. Vascular calcifications are seen in the soft tissues. Left femur: No acute fracture or dislocation identified. There are vascular calcifications in the soft tissues. IMPRESSION: 1. No acute fracture or dislocation of the left femur or left knee. 2. Left knee arthroplasty in anatomic alignment. Electronically Signed   By: Ronney Asters M.D.   On: 01/27/2021  21:19   DG Hip Unilat W or Wo Pelvis 2-3 Views Left  Result Date: 01/27/2021 CLINICAL DATA:  Post fall with left hip pain. EXAM: DG HIP (WITH OR WITHOUT PELVIS) 2-3V LEFT COMPARISON:  Pelvis radiograph 12/29/2020 FINDINGS: Bones are diffusely under mineralized. No visualized fracture of the pelvis or left hip. Femoral head is seated. Mild left hip osteoarthritis with joint space narrowing and spurring. No visualized pubic rami fracture. Pubic symphysis and sacroiliac joints are congruent. IMPRESSION: No fracture of the pelvis or left hip. Electronically Signed   By: Aurther Loft.D.  On: 01/27/2021 18:30   DG Femur Portable 1 View Left  Result Date: 01/27/2021 CLINICAL DATA:  Fall. EXAM: LEFT FEMUR PORTABLE 1 VIEW; PORTABLE LEFT KNEE - 1-2 VIEW COMPARISON:  The left knee x-ray 12/31/2015. FINDINGS: Left knee: Left knee arthroplasty appears in anatomic alignment. There is no evidence for hardware loosening or acute fracture. There is no significant joint effusion. Vascular calcifications are seen in the soft tissues. Left femur: No acute fracture or dislocation identified. There are vascular calcifications in the soft tissues. IMPRESSION: 1. No acute fracture or dislocation of the left femur or left knee. 2. Left knee arthroplasty in anatomic alignment. Electronically Signed   By: Ronney Asters M.D.   On: 01/27/2021 21:19   CT CHEST ABDOMEN PELVIS WO CONTRAST  Result Date: 01/28/2021 CLINICAL DATA:  Fall, left hip pain EXAM: CT CHEST, ABDOMEN AND PELVIS WITHOUT CONTRAST TECHNIQUE: Multidetector CT imaging of the chest, abdomen and pelvis was performed following the standard protocol without IV contrast. COMPARISON:  None. FINDINGS: CT CHEST FINDINGS Cardiovascular: The heart is normal in size. No pericardial effusion. No evidence of thoracic aortic aneurysm. Atherosclerotic calcifications of the arch. Mitral valve annular calcifications. Mild coronary atherosclerosis of the LAD.  Mediastinum/Nodes: No suspicious mediastinal lymphadenopathy. Visualized thyroid is unremarkable. Lungs/Pleura: Biapical pleural-parenchymal scarring. No suspicious pulmonary nodules. Very mild centrilobular emphysematous changes in the upper lobes. No focal consolidation or aspiration. No pleural effusion or pneumothorax. Musculoskeletal: No fracture is seen. Sternum, clavicles, scapulae, and bilateral ribs are intact. Dedicated thoracic spine has been performed and will be reported separately. CT ABDOMEN PELVIS FINDINGS Hepatobiliary: Scattered hepatic cysts measuring up to 2.9 cm, benign. No perihepatic fluid/hemorrhage. Status post cholecystectomy. No intrahepatic or extrahepatic duct dilatation. Pancreas: Within normal limits. Spleen: Within normal limits.  No perisplenic fluid/hemorrhage. Adrenals/Urinary Tract: Adrenal glands are within normal limits. Bilateral renal cysts, measuring up to 5.3 cm in the anterior left upper kidney, benign. No hydronephrosis. Bladder is within normal limits. Stomach/Bowel: Stomach is notable for a small hiatal hernia. No evidence of bowel obstruction. Appendix is not discretely visualized. Sigmoid diverticulosis, without evidence of diverticulitis. Vascular/Lymphatic: No evidence of abdominal aortic aneurysm. Atherosclerotic calcifications of the abdominal aorta and branch vessels. No suspicious abdominopelvic lymphadenopathy. Reproductive: Status post hysterectomy. Right ovary is within normal limits. 4.2 cm simple left ovarian cyst (series 3/image 99). No follow-up imaging recommended. Note: This recommendation does not apply to premenarchal patients and to those with increased risk (genetic, family history, elevated tumor markers or other high-risk factors) of ovarian cancer. Reference: JACR 2020 Feb; 17(2):248-254 Other: No abdominopelvic ascites. No hemoperitoneum or free air. Musculoskeletal: Comminuted intertrochanteric left hip fracture with mildly displaced lesser  trochanter fragment and mild foreshortening. Overlying minimal subcutaneous bruising/hemorrhage (series 3/image 124). Visualized bony pelvis and right femur are intact. Mild degenerative changes of the bilateral hips. Left gluteal intramuscular lipoma (series 3/image 91), benign. Dedicated lumbar spine has been performed and will be reported separately. IMPRESSION: Comminuted, displaced left hip fracture, as above. Otherwise, no evidence of traumatic injury to the chest/abdomen. Dedicated thoracolumbar spine has been performed and will be reported separately. Electronically Signed   By: Julian Hy M.D.   On: 01/28/2021 00:12    Procedures Procedures   Medications Ordered in ED Medications  fentaNYL (SUBLIMAZE) injection 25 mcg (25 mcg Intravenous Given 01/28/21 0049)  lactated ringers infusion (has no administration in time range)  lactated ringers bolus 500 mL (0 mLs Intravenous Stopped 01/27/21 2040)  HYDROcodone-acetaminophen (NORCO/VICODIN) 5-325 MG per tablet 1 tablet (  1 tablet Oral Given 01/27/21 2325)    ED Course  I have reviewed the triage vital signs and the nursing notes.  Pertinent labs & imaging results that were available during my care of the patient were reviewed by me and considered in my medical decision making (see chart for details).    MDM Rules/Calculators/A&P                          Patient is 87 year old female with history of dementia, who presents for an unwitnessed fall at her skilled nursing facility.  Staff at the facility report that she likely fell forward out of a chair.  She does have a hematoma on her anterior midline forehead.  She is on anticoagulation.  Per SNF paperwork, this is warfarin.  On arrival, vital signs are notable for hypertension.  Small area of bruising and swelling noted to forehead.  No areas of tenderness are noted to thorax, abdomen, or upper extremities.  She does endorse pain in her left leg.  On exam, left leg does appear to be  shortened.  Trauma labs, CT scan of head and neck, in addition to x-rays of chest, pelvis, and left hip were ordered.  Results of imaging studies were negative for any acute injuries.  Patient cervical collar was cleared.  Her INR is subtherapeutic, putting her at minimal risk for bleeding.  Other lab work was notable for slight increase in creatinine.  She was given IV fluids in the ED.  Lab work otherwise showed no abnormalities that would suggest an underlying infectious or metabolic process that would put her at risk for falls.  Patient was able to ambulate in the ED.  Plan was for discharge back to skilled nursing facility.  While in the ED, patient was found seated on the floor at the foot of her stretcher following what is presumed to be another unwitnessed fall.  Following this, she laid onto her left side.  She was helped up off of the ground.  At this point, she had a difficult time bearing weight.  She was eased back into the ED stretcher.  She was complaining of severe pain in the area of her left groin.  Repeat imaging was ordered to assess for any new injuries.  Results of CT imaging showed a comminuted fracture of her left hip.  Orthopedic surgery was consulted.  Patient's husband and medical decision maker, Mr. Yudany Domico, was informed of these events over telephone.  Due to prolonged time awaiting callback from orthopedic surgery, I did speak with hospitalist for admission.  Dr. Renetta Chalk stated that he would like to obtain an operative plan prior to admission.  Care of patient was signed out to oncoming ED provider.  Final Clinical Impression(s) / ED Diagnoses Final diagnoses:  Fall    Rx / DC Orders ED Discharge Orders     None        Godfrey Pick, MD 01/28/21 0225

## 2021-01-27 NOTE — ED Notes (Addendum)
Pt found standing at edge of bed with purewick removed, monitoring leads removed and IV pulled out. Pt assisted back into bed, pt ambulatory with one person assist, cleaned up at this time. Linens changed. Able to obtain some urine for urine sample from purewick. Pt states "I still need to pee." Pt placed on bed pan.

## 2021-01-27 NOTE — ED Notes (Signed)
Pt screaming and yelling reporting pain to left hip. EDP notified for pain medication. Pt reports she is unable to move her left leg d/t pain.

## 2021-01-27 NOTE — Discharge Instructions (Addendum)
INR is subtherapeutic.  [name] will likely need changes in dosing for management of her DVT.  Creatinine was slightly up.  This translates to kidney function slightly down.  Drink plenty of fluids to improve kidney function.  Please return for any further concerns.     POST-OPERATIVE OPIOID TAPER INSTRUCTIONS: It is important to wean off of your opioid medication as soon as possible. If you do not need pain medication after your surgery it is ok to stop day one. Opioids include: Codeine, Hydrocodone(Norco, Vicodin), Oxycodone(Percocet, oxycontin) and hydromorphone amongst others.  Long term and even short term use of opiods can cause: Increased pain response Dependence Constipation Depression Respiratory depression And more.  Withdrawal symptoms can include Flu like symptoms Nausea, vomiting And more Techniques to manage these symptoms Hydrate well Eat regular healthy meals Stay active Use relaxation techniques(deep breathing, meditating, yoga) Do Not substitute Alcohol to help with tapering If you have been on opioids for less than two weeks and do not have pain than it is ok to stop all together.  Plan to wean off of opioids This plan should start within one week post op of your joint replacement. Maintain the same interval or time between taking each dose and first decrease the dose.  Cut the total daily intake of opioids by one tablet each day Next start to increase the time between doses. The last dose that should be eliminated is the evening dose.

## 2021-01-27 NOTE — ED Notes (Signed)
Pt's facility [facility] notified regarding pt's recent fall. Also notified about pt's potential discharge and waiting for PTAR if all new scans result negative.

## 2021-01-28 ENCOUNTER — Inpatient Hospital Stay (HOSPITAL_COMMUNITY): Payer: Medicare Other

## 2021-01-28 ENCOUNTER — Inpatient Hospital Stay (HOSPITAL_COMMUNITY): Payer: Medicare Other | Admitting: Certified Registered Nurse Anesthetist

## 2021-01-28 ENCOUNTER — Encounter (HOSPITAL_COMMUNITY): Payer: Self-pay | Admitting: Internal Medicine

## 2021-01-28 ENCOUNTER — Encounter (HOSPITAL_COMMUNITY)
Admission: EM | Disposition: A | Discharge status: Hospice | Payer: Self-pay | Source: Skilled Nursing Facility | Attending: Internal Medicine

## 2021-01-28 DIAGNOSIS — W1830XA Fall on same level, unspecified, initial encounter: Secondary | ICD-10-CM | POA: Diagnosis present

## 2021-01-28 DIAGNOSIS — S72002A Fracture of unspecified part of neck of left femur, initial encounter for closed fracture: Secondary | ICD-10-CM | POA: Diagnosis not present

## 2021-01-28 DIAGNOSIS — E039 Hypothyroidism, unspecified: Secondary | ICD-10-CM | POA: Diagnosis present

## 2021-01-28 DIAGNOSIS — Y92238 Other place in hospital as the place of occurrence of the external cause: Secondary | ICD-10-CM | POA: Diagnosis present

## 2021-01-28 DIAGNOSIS — Z885 Allergy status to narcotic agent status: Secondary | ICD-10-CM | POA: Diagnosis not present

## 2021-01-28 DIAGNOSIS — Z20822 Contact with and (suspected) exposure to covid-19: Secondary | ICD-10-CM | POA: Diagnosis present

## 2021-01-28 DIAGNOSIS — Z66 Do not resuscitate: Secondary | ICD-10-CM | POA: Diagnosis present

## 2021-01-28 DIAGNOSIS — E440 Moderate protein-calorie malnutrition: Secondary | ICD-10-CM | POA: Diagnosis present

## 2021-01-28 DIAGNOSIS — S72009A Fracture of unspecified part of neck of unspecified femur, initial encounter for closed fracture: Secondary | ICD-10-CM | POA: Diagnosis present

## 2021-01-28 DIAGNOSIS — J45909 Unspecified asthma, uncomplicated: Secondary | ICD-10-CM | POA: Diagnosis present

## 2021-01-28 DIAGNOSIS — N1831 Chronic kidney disease, stage 3a: Secondary | ICD-10-CM | POA: Diagnosis not present

## 2021-01-28 DIAGNOSIS — Z515 Encounter for palliative care: Secondary | ICD-10-CM | POA: Diagnosis not present

## 2021-01-28 DIAGNOSIS — Z882 Allergy status to sulfonamides status: Secondary | ICD-10-CM | POA: Diagnosis not present

## 2021-01-28 DIAGNOSIS — I160 Hypertensive urgency: Secondary | ICD-10-CM

## 2021-01-28 DIAGNOSIS — N179 Acute kidney failure, unspecified: Secondary | ICD-10-CM | POA: Diagnosis present

## 2021-01-28 DIAGNOSIS — W19XXXA Unspecified fall, initial encounter: Secondary | ICD-10-CM | POA: Diagnosis not present

## 2021-01-28 DIAGNOSIS — E785 Hyperlipidemia, unspecified: Secondary | ICD-10-CM | POA: Diagnosis present

## 2021-01-28 DIAGNOSIS — Z888 Allergy status to other drugs, medicaments and biological substances status: Secondary | ICD-10-CM | POA: Diagnosis not present

## 2021-01-28 DIAGNOSIS — Y92129 Unspecified place in nursing home as the place of occurrence of the external cause: Secondary | ICD-10-CM | POA: Diagnosis not present

## 2021-01-28 DIAGNOSIS — E875 Hyperkalemia: Secondary | ICD-10-CM | POA: Diagnosis present

## 2021-01-28 DIAGNOSIS — D539 Nutritional anemia, unspecified: Secondary | ICD-10-CM | POA: Diagnosis present

## 2021-01-28 DIAGNOSIS — W07XXXA Fall from chair, initial encounter: Secondary | ICD-10-CM | POA: Diagnosis present

## 2021-01-28 DIAGNOSIS — S0083XA Contusion of other part of head, initial encounter: Secondary | ICD-10-CM | POA: Diagnosis present

## 2021-01-28 DIAGNOSIS — I447 Left bundle-branch block, unspecified: Secondary | ICD-10-CM | POA: Diagnosis present

## 2021-01-28 DIAGNOSIS — S72142A Displaced intertrochanteric fracture of left femur, initial encounter for closed fracture: Secondary | ICD-10-CM | POA: Diagnosis present

## 2021-01-28 DIAGNOSIS — N1832 Chronic kidney disease, stage 3b: Secondary | ICD-10-CM | POA: Diagnosis present

## 2021-01-28 DIAGNOSIS — Z880 Allergy status to penicillin: Secondary | ICD-10-CM | POA: Diagnosis not present

## 2021-01-28 DIAGNOSIS — I82412 Acute embolism and thrombosis of left femoral vein: Secondary | ICD-10-CM | POA: Diagnosis present

## 2021-01-28 DIAGNOSIS — D509 Iron deficiency anemia, unspecified: Secondary | ICD-10-CM | POA: Diagnosis present

## 2021-01-28 DIAGNOSIS — F03918 Unspecified dementia, unspecified severity, with other behavioral disturbance: Secondary | ICD-10-CM | POA: Diagnosis present

## 2021-01-28 DIAGNOSIS — D62 Acute posthemorrhagic anemia: Secondary | ICD-10-CM | POA: Diagnosis not present

## 2021-01-28 DIAGNOSIS — I129 Hypertensive chronic kidney disease with stage 1 through stage 4 chronic kidney disease, or unspecified chronic kidney disease: Secondary | ICD-10-CM | POA: Diagnosis present

## 2021-01-28 HISTORY — PX: INTRAMEDULLARY (IM) NAIL INTERTROCHANTERIC: SHX5875

## 2021-01-28 LAB — SURGICAL PCR SCREEN
MRSA, PCR: POSITIVE — AB
Staphylococcus aureus: POSITIVE — AB

## 2021-01-28 LAB — NO BLOOD PRODUCTS

## 2021-01-28 LAB — PROTIME-INR
INR: 1.2 (ref 0.8–1.2)
Prothrombin Time: 14.8 seconds (ref 11.4–15.2)

## 2021-01-28 SURGERY — FIXATION, FRACTURE, INTERTROCHANTERIC, WITH INTRAMEDULLARY ROD
Anesthesia: General | Site: Hip | Laterality: Left

## 2021-01-28 MED ORDER — LACTATED RINGERS IV SOLN: INTRAVENOUS | Status: DC | PRN

## 2021-01-28 MED ORDER — TRANEXAMIC ACID-NACL 1000-0.7 MG/100ML-% IV SOLN
INTRAVENOUS | Status: AC
  Filled 2021-01-28: qty 100

## 2021-01-28 MED ORDER — PHENYLEPHRINE 40 MCG/ML (10ML) SYRINGE FOR IV PUSH (FOR BLOOD PRESSURE SUPPORT)
PREFILLED_SYRINGE | INTRAVENOUS | Status: DC | PRN
  Administered 2021-01-28: 120 ug via INTRAVENOUS
  Administered 2021-01-28: 80 ug via INTRAVENOUS
  Administered 2021-01-28: 120 ug via INTRAVENOUS

## 2021-01-28 MED ORDER — FENTANYL CITRATE (PF) 250 MCG/5ML IJ SOLN
INTRAMUSCULAR | Status: AC
  Filled 2021-01-28: qty 5

## 2021-01-28 MED ORDER — CHLORHEXIDINE GLUCONATE 0.12 % MT SOLN
OROMUCOSAL | Status: AC
  Filled 2021-01-28: qty 15

## 2021-01-28 MED ORDER — ALUM & MAG HYDROXIDE-SIMETH 200-200-20 MG/5ML PO SUSP: 30.0000 mL | ORAL | Status: DC | PRN

## 2021-01-28 MED ORDER — METHOCARBAMOL 500 MG PO TABS: 500.0000 mg | ORAL_TABLET | Freq: Four times a day (QID) | ORAL | Status: DC | PRN

## 2021-01-28 MED ORDER — TRAMADOL HCL 50 MG PO TABS
50.0000 mg | ORAL_TABLET | Freq: Four times a day (QID) | ORAL | Status: DC
  Administered 2021-01-28 – 2021-01-30 (×7): 50 mg via ORAL
  Filled 2021-01-28 (×7): qty 1

## 2021-01-28 MED ORDER — VANCOMYCIN HCL 1000 MG/200ML IV SOLN
1000.0000 mg | INTRAVENOUS | Status: AC
  Administered 2021-01-29: 1000 mg via INTRAVENOUS
  Filled 2021-01-28 (×2): qty 200

## 2021-01-28 MED ORDER — PHENOL 1.4 % MT LIQD: 1.0000 | OROMUCOSAL | Status: DC | PRN

## 2021-01-28 MED ORDER — HYDROCODONE-ACETAMINOPHEN 5-325 MG PO TABS: 1.0000 | ORAL_TABLET | ORAL | Status: DC | PRN

## 2021-01-28 MED ORDER — HYDROCODONE-ACETAMINOPHEN 5-325 MG PO TABS
1.0000 | ORAL_TABLET | Freq: Four times a day (QID) | ORAL | Status: DC | PRN
  Administered 2021-01-28: 1 via ORAL
  Filled 2021-01-28: qty 1

## 2021-01-28 MED ORDER — MORPHINE SULFATE (PF) 2 MG/ML IV SOLN
0.5000 mg | INTRAVENOUS | Status: DC | PRN
  Filled 2021-01-28: qty 1

## 2021-01-28 MED ORDER — ONDANSETRON HCL 4 MG/2ML IJ SOLN: 4.0000 mg | Freq: Four times a day (QID) | INTRAMUSCULAR | Status: DC | PRN

## 2021-01-28 MED ORDER — ACETAMINOPHEN 325 MG PO TABS: 325.0000 mg | ORAL_TABLET | Freq: Four times a day (QID) | ORAL | Status: DC | PRN

## 2021-01-28 MED ORDER — PANTOPRAZOLE SODIUM 40 MG PO TBEC
40.0000 mg | DELAYED_RELEASE_TABLET | Freq: Every day | ORAL | Status: DC
  Administered 2021-01-28 – 2021-01-30 (×3): 40 mg via ORAL
  Filled 2021-01-28 (×3): qty 1

## 2021-01-28 MED ORDER — PROPOFOL 10 MG/ML IV BOLUS
INTRAVENOUS | Status: AC
  Filled 2021-01-28: qty 20

## 2021-01-28 MED ORDER — METOCLOPRAMIDE HCL 5 MG PO TABS: 5.0000 mg | ORAL_TABLET | Freq: Three times a day (TID) | ORAL | Status: DC | PRN

## 2021-01-28 MED ORDER — VITAMIN B-12 1000 MCG PO TABS
1000.0000 ug | ORAL_TABLET | Freq: Every day | ORAL | Status: DC
  Administered 2021-01-29 – 2021-01-30 (×2): 1000 ug via ORAL
  Filled 2021-01-28 (×2): qty 1

## 2021-01-28 MED ORDER — BISACODYL 10 MG RE SUPP: 10.0000 mg | Freq: Every day | RECTAL | Status: DC | PRN

## 2021-01-28 MED ORDER — ACETAMINOPHEN 500 MG PO TABS
500.0000 mg | ORAL_TABLET | Freq: Four times a day (QID) | ORAL | Status: AC
  Administered 2021-01-28 – 2021-01-29 (×3): 500 mg via ORAL
  Filled 2021-01-28 (×3): qty 1

## 2021-01-28 MED ORDER — QUETIAPINE 12.5 MG HALF TABLET
12.5000 mg | ORAL_TABLET | Freq: Two times a day (BID) | ORAL | Status: DC
  Administered 2021-01-28 – 2021-01-30 (×5): 12.5 mg via ORAL
  Filled 2021-01-28 (×7): qty 1

## 2021-01-28 MED ORDER — ACETAMINOPHEN 10 MG/ML IV SOLN
INTRAVENOUS | Status: AC
  Filled 2021-01-28: qty 100

## 2021-01-28 MED ORDER — ROCURONIUM BROMIDE 10 MG/ML (PF) SYRINGE
PREFILLED_SYRINGE | INTRAVENOUS | Status: DC | PRN
  Administered 2021-01-28: 50 mg via INTRAVENOUS

## 2021-01-28 MED ORDER — LIDOCAINE 2% (20 MG/ML) 5 ML SYRINGE
INTRAMUSCULAR | Status: DC | PRN
  Administered 2021-01-28: 80 mg via INTRAVENOUS

## 2021-01-28 MED ORDER — ONDANSETRON HCL 4 MG/2ML IJ SOLN
INTRAMUSCULAR | Status: DC | PRN
  Administered 2021-01-28: 4 mg via INTRAVENOUS

## 2021-01-28 MED ORDER — AMLODIPINE BESYLATE 5 MG PO TABS: 2.5000 mg | ORAL_TABLET | Freq: Every day | ORAL | Status: DC

## 2021-01-28 MED ORDER — LACTATED RINGERS IV SOLN
INTRAVENOUS | Status: DC
Start: 2021-01-28 — End: 2021-01-28

## 2021-01-28 MED ORDER — MENTHOL 3 MG MT LOZG: 1.0000 | LOZENGE | OROMUCOSAL | Status: DC | PRN

## 2021-01-28 MED ORDER — POLYETHYLENE GLYCOL 3350 17 G PO PACK: 17.0000 g | PACK | Freq: Every day | ORAL | Status: DC | PRN

## 2021-01-28 MED ORDER — METOCLOPRAMIDE HCL 5 MG/ML IJ SOLN: 5.0000 mg | Freq: Three times a day (TID) | INTRAMUSCULAR | Status: DC | PRN

## 2021-01-28 MED ORDER — METHOCARBAMOL 1000 MG/10ML IJ SOLN
500.0000 mg | Freq: Four times a day (QID) | INTRAVENOUS | Status: DC | PRN
  Filled 2021-01-28 (×2): qty 5

## 2021-01-28 MED ORDER — PROPOFOL 10 MG/ML IV BOLUS
INTRAVENOUS | Status: DC | PRN
  Administered 2021-01-28: 80 mg via INTRAVENOUS

## 2021-01-28 MED ORDER — AMLODIPINE BESYLATE 10 MG PO TABS
10.0000 mg | ORAL_TABLET | Freq: Every day | ORAL | Status: DC
  Administered 2021-01-28 (×2): 10 mg via ORAL
  Filled 2021-01-28: qty 1
  Filled 2021-01-28: qty 2
  Filled 2021-01-28 (×2): qty 1

## 2021-01-28 MED ORDER — DOCUSATE SODIUM 100 MG PO CAPS: 100.0000 mg | ORAL_CAPSULE | Freq: Two times a day (BID) | ORAL | Status: DC

## 2021-01-28 MED ORDER — FENTANYL CITRATE PF 50 MCG/ML IJ SOSY
25.0000 ug | PREFILLED_SYRINGE | INTRAMUSCULAR | Status: DC | PRN
  Administered 2021-01-28 (×2): 25 ug via INTRAVENOUS
  Filled 2021-01-28 (×2): qty 1

## 2021-01-28 MED ORDER — EPHEDRINE SULFATE 50 MG/ML IJ SOLN
INTRAMUSCULAR | Status: DC | PRN
  Administered 2021-01-28: 10 mg via INTRAVENOUS
  Administered 2021-01-28: 5 mg via INTRAVENOUS
  Administered 2021-01-28: 10 mg via INTRAVENOUS

## 2021-01-28 MED ORDER — 0.9 % SODIUM CHLORIDE (POUR BTL) OPTIME
TOPICAL | Status: DC | PRN
  Administered 2021-01-28: 1000 mL

## 2021-01-28 MED ORDER — LEVOTHYROXINE SODIUM 75 MCG PO TABS
175.0000 ug | ORAL_TABLET | Freq: Every day | ORAL | Status: DC
  Administered 2021-01-29 – 2021-01-31 (×3): 175 ug via ORAL
  Filled 2021-01-28 (×3): qty 1

## 2021-01-28 MED ORDER — ACETAMINOPHEN 500 MG PO TABS: 1000.0000 mg | ORAL_TABLET | Freq: Once | ORAL | Status: DC

## 2021-01-28 MED ORDER — DOCUSATE SODIUM 100 MG PO CAPS
100.0000 mg | ORAL_CAPSULE | Freq: Two times a day (BID) | ORAL | Status: DC
  Administered 2021-01-28 – 2021-01-30 (×4): 100 mg via ORAL
  Filled 2021-01-28 (×5): qty 1

## 2021-01-28 MED ORDER — ONDANSETRON HCL 4 MG PO TABS: 4.0000 mg | ORAL_TABLET | Freq: Four times a day (QID) | ORAL | Status: DC | PRN

## 2021-01-28 MED ORDER — POVIDONE-IODINE 10 % EX SWAB
2.0000 "application " | Freq: Once | CUTANEOUS | Status: AC
  Administered 2021-01-28: 2 via TOPICAL

## 2021-01-28 MED ORDER — SUGAMMADEX SODIUM 200 MG/2ML IV SOLN
INTRAVENOUS | Status: DC | PRN
Start: 2021-01-28 — End: 2021-01-28
  Administered 2021-01-28: 200 mg via INTRAVENOUS

## 2021-01-28 MED ORDER — VANCOMYCIN HCL IN DEXTROSE 1-5 GM/200ML-% IV SOLN
1000.0000 mg | INTRAVENOUS | Status: AC
  Administered 2021-01-28: 1000 mg via INTRAVENOUS

## 2021-01-28 MED ORDER — DEXAMETHASONE SODIUM PHOSPHATE 10 MG/ML IJ SOLN
INTRAMUSCULAR | Status: AC
  Filled 2021-01-28: qty 1

## 2021-01-28 MED ORDER — CHLORHEXIDINE GLUCONATE 0.12 % MT SOLN
15.0000 mL | Freq: Once | OROMUCOSAL | Status: DC
  Filled 2021-01-28 (×2): qty 15

## 2021-01-28 MED ORDER — ROSUVASTATIN CALCIUM 5 MG PO TABS
10.0000 mg | ORAL_TABLET | ORAL | Status: DC
  Administered 2021-01-29: 10 mg via ORAL
  Filled 2021-01-28: qty 2

## 2021-01-28 MED ORDER — DEXAMETHASONE SODIUM PHOSPHATE 10 MG/ML IJ SOLN
INTRAMUSCULAR | Status: DC | PRN
  Administered 2021-01-28: 5 mg via INTRAVENOUS

## 2021-01-28 MED ORDER — PHENYLEPHRINE HCL-NACL 20-0.9 MG/250ML-% IV SOLN
INTRAVENOUS | Status: DC | PRN
  Administered 2021-01-28: 25 ug/min via INTRAVENOUS

## 2021-01-28 MED ORDER — VANCOMYCIN HCL IN DEXTROSE 1-5 GM/200ML-% IV SOLN
INTRAVENOUS | Status: AC
  Filled 2021-01-28: qty 200

## 2021-01-28 MED ORDER — TRANEXAMIC ACID-NACL 1000-0.7 MG/100ML-% IV SOLN
1000.0000 mg | Freq: Once | INTRAVENOUS | Status: AC
  Administered 2021-01-28: 1000 mg via INTRAVENOUS
  Filled 2021-01-28 (×2): qty 100

## 2021-01-28 MED ORDER — CEFAZOLIN SODIUM-DEXTROSE 2-3 GM-%(50ML) IV SOLR
INTRAVENOUS | Status: DC | PRN
  Administered 2021-01-28: 2 g via INTRAVENOUS

## 2021-01-28 MED ORDER — HYDRALAZINE HCL 20 MG/ML IJ SOLN: 10.0000 mg | INTRAMUSCULAR | Status: DC | PRN

## 2021-01-28 MED ORDER — LATANOPROST 0.005 % OP SOLN
1.0000 [drp] | Freq: Every day | OPHTHALMIC | Status: DC
  Administered 2021-01-28 – 2021-01-30 (×3): 1 [drp] via OPHTHALMIC
  Filled 2021-01-28: qty 2.5

## 2021-01-28 MED ORDER — ACETAMINOPHEN 500 MG PO TABS
ORAL_TABLET | ORAL | Status: AC
  Filled 2021-01-28: qty 2

## 2021-01-28 MED ORDER — METOPROLOL SUCCINATE ER 100 MG PO TB24
100.0000 mg | ORAL_TABLET | Freq: Every day | ORAL | Status: DC
  Administered 2021-01-28: 100 mg via ORAL
  Filled 2021-01-28 (×2): qty 1
  Filled 2021-01-28: qty 4

## 2021-01-28 MED ORDER — MORPHINE SULFATE (PF) 2 MG/ML IV SOLN
0.5000 mg | INTRAVENOUS | Status: DC | PRN
  Administered 2021-01-28: 0.5 mg via INTRAVENOUS
  Filled 2021-01-28: qty 1

## 2021-01-28 MED ORDER — TRANEXAMIC ACID-NACL 1000-0.7 MG/100ML-% IV SOLN
1000.0000 mg | INTRAVENOUS | Status: AC
  Administered 2021-01-28: 1000 mg via INTRAVENOUS

## 2021-01-28 MED ORDER — SUCCINYLCHOLINE CHLORIDE 200 MG/10ML IV SOSY
PREFILLED_SYRINGE | INTRAVENOUS | Status: DC | PRN
  Administered 2021-01-28: 100 mg via INTRAVENOUS

## 2021-01-28 MED ORDER — FENTANYL CITRATE (PF) 250 MCG/5ML IJ SOLN
INTRAMUSCULAR | Status: DC | PRN
  Administered 2021-01-28 (×3): 50 ug via INTRAVENOUS

## 2021-01-28 MED ORDER — METOPROLOL SUCCINATE ER 25 MG PO TB24: 100.0000 mg | ORAL_TABLET | Freq: Every day | ORAL | Status: DC

## 2021-01-28 MED ORDER — HALOPERIDOL LACTATE 5 MG/ML IJ SOLN
1.0000 mg | Freq: Four times a day (QID) | INTRAMUSCULAR | Status: DC | PRN
  Administered 2021-01-30 (×2): 1 mg via INTRAVENOUS
  Filled 2021-01-28 (×3): qty 1

## 2021-01-28 SURGICAL SUPPLY — 44 items
APL SKNCLS STERI-STRIP NONHPOA (GAUZE/BANDAGES/DRESSINGS) ×1
BAG COUNTER SPONGE SURGICOUNT (BAG) ×2 IMPLANT
BAG SPNG CNTER NS LX DISP (BAG) ×1
BAG SURGICOUNT SPONGE COUNTING (BAG) ×1
BENZOIN TINCTURE PRP APPL 2/3 (GAUZE/BANDAGES/DRESSINGS) ×3 IMPLANT
BIT DRILL CALIBRATED 4.2 (BIT) IMPLANT
BIT DRILL CAN STP 6MM/9MM HIP (BIT) ×1
BIT DRILL CANN 16 (BIT) ×2 IMPLANT
BIT DRILL CANN STP 6/9 HIP (BIT) ×1 IMPLANT
BLADE TFNA HELICAL 95 STRL (Anchor) ×2 IMPLANT
BOOTCOVER CLEANROOM LRG (PROTECTIVE WEAR) ×6 IMPLANT
CLOSURE STERI-STRIP 1/2X4 (GAUZE/BANDAGES/DRESSINGS) ×1
CLSR STERI-STRIP ANTIMIC 1/2X4 (GAUZE/BANDAGES/DRESSINGS) ×2 IMPLANT
COVER PERINEAL POST (MISCELLANEOUS) ×3 IMPLANT
COVER SURGICAL LIGHT HANDLE (MISCELLANEOUS) ×3 IMPLANT
DRAPE STERI IOBAN 125X83 (DRAPES) ×3 IMPLANT
DRESSING MEPILEX FLEX 4X4 (GAUZE/BANDAGES/DRESSINGS) IMPLANT
DRILL BIT CALIBRATED 4.2 (BIT) ×3
DRSG MEPILEX BORDER 4X4 (GAUZE/BANDAGES/DRESSINGS) ×6 IMPLANT
DRSG MEPILEX FLEX 4X4 (GAUZE/BANDAGES/DRESSINGS) ×6
DURAPREP 26ML APPLICATOR (WOUND CARE) ×3 IMPLANT
ELECT CAUTERY BLADE 6.4 (BLADE) ×3 IMPLANT
EVACUATOR 1/8 PVC DRAIN (DRAIN) IMPLANT
FACESHIELD WRAPAROUND (MASK) ×6 IMPLANT
FACESHIELD WRAPAROUND OR TEAM (MASK) ×2 IMPLANT
GAUZE XEROFORM 5X9 LF (GAUZE/BANDAGES/DRESSINGS) ×3 IMPLANT
GLOVE SURG ENC MOIS LTX SZ7 (GLOVE) ×6 IMPLANT
GLOVE SURG ORTHO LTX SZ7.5 (GLOVE) ×6 IMPLANT
GLOVE SURG UNDER POLY LF SZ7 (GLOVE) ×3 IMPLANT
GOWN STRL REUS W/ TWL LRG LVL3 (GOWN DISPOSABLE) ×2 IMPLANT
GOWN STRL REUS W/TWL LRG LVL3 (GOWN DISPOSABLE) ×6
GUIDEWIRE 3.2X400 (WIRE) ×4 IMPLANT
KIT TURNOVER KIT B (KITS) ×3 IMPLANT
NAIL TROCH FIX 10X170 130 (Nail) ×2 IMPLANT
NS IRRIG 1000ML POUR BTL (IV SOLUTION) ×3 IMPLANT
PACK GENERAL/GYN (CUSTOM PROCEDURE TRAY) ×3 IMPLANT
PAD ARMBOARD 7.5X6 YLW CONV (MISCELLANEOUS) ×6 IMPLANT
SCREW LOCK T25 FT 36X5X4.3X (Screw) IMPLANT
SCREW LOCKING 5.0X36MM (Screw) ×3 IMPLANT
SUT VIC AB 0 CT1 27 (SUTURE) ×3
SUT VIC AB 0 CT1 27XBRD ANBCTR (SUTURE) ×1 IMPLANT
SUT VIC AB 3-0 SH 8-18 (SUTURE) ×3 IMPLANT
TOWEL GREEN STERILE (TOWEL DISPOSABLE) ×3 IMPLANT
TOWEL GREEN STERILE FF (TOWEL DISPOSABLE) ×3 IMPLANT

## 2021-01-28 NOTE — Progress Notes (Signed)
This rn called pt son to offer update on pt condition, son did not answer phone. Voice message left asking son to call this rn for updates questions or concerns

## 2021-01-28 NOTE — Anesthesia Procedure Notes (Signed)
Procedure Name: Intubation Date/Time: 01/28/2021 1:18 PM Performed by: Dairl Ponder, CRNA Pre-anesthesia Checklist: Patient identified, Emergency Drugs available, Suction available and Patient being monitored Patient Re-evaluated:Patient Re-evaluated prior to induction Oxygen Delivery Method: Circle System Utilized Preoxygenation: Pre-oxygenation with 100% oxygen Induction Type: IV induction Ventilation: Mask ventilation without difficulty Laryngoscope Size: Miller and 2 Grade View: Grade III Tube type: Oral Tube size: 7.0 mm Number of attempts: 1 Airway Equipment and Method: Stylet and Oral airway Placement Confirmation: positive ETCO2 and breath sounds checked- equal and bilateral Secured at: 22 cm Tube secured with: Tape Dental Injury: Teeth and Oropharynx as per pre-operative assessment

## 2021-01-28 NOTE — H&P (Addendum)
History and Physical    [name] [id number] DOB: [birth year] DOA: 01/27/2021  PCP: Pcp, No  Patient coming from: [place].  Chief Complaint: Unwitnessed fall.  HPI: [name] is a 87 y.o. female with history of dementia, hypertension, hypothyroidism was brought to the ER after patient had an unwitnessed fall at the living facility.  No further information is available about the fall.  ED Course: In the ER initial x-rays do not show any fractures.  Patient had a fall in the ER and then had a CT head C-spine chest abdomen pelvis which shows left hip comminuted fracture.  Labs show macrocytic anemia and creatinine 1.5 high sensitive troponins were negative EKG shows wandering pacemaker with LBBB.  On-call orthopedic surgeon Dr. Mardelle Matte was consulted and admitted for further management.  COVID test is negative.  Review of Systems: As per HPI, rest all negative.   Past Medical History:  Diagnosis Date   Dementia ([place])    DVT, lower extremity ([place])    Hemorrhoids    Hypertension    Hypothyroidism    Thyroid disease     Past Surgical History:  Procedure Laterality Date   ABDOMINAL HYSTERECTOMY     APPENDECTOMY     CHOLECYSTECTOMY     JOINT REPLACEMENT     left knee   NO PAST SURGERIES     right knee replacement     TUBAL LIGATION       reports that she has never smoked. She has never been exposed to tobacco smoke. She has never used smokeless tobacco. She reports that she does not drink alcohol and does not use drugs.  Allergies  Allergen Reactions   Contrast Media [Iodinated Diagnostic Agents] Other (See Comments)    Causes patient to pass out   Elemental Sulfur Anaphylaxis   Penicillins Anaphylaxis    Has patient had a PCN reaction causing immediate rash, facial/tongue/throat swelling, SOB or lightheadedness with hypotension: Yes Has patient had a PCN reaction causing severe rash involving mucus membranes or skin necrosis:   Unknown Has patient had a PCN reaction that required hospitalization: No  Has patient had a PCN reaction occurring within the last 10 years: No  If all of the above answers are "NO", then may proceed with Cephalosporin use.    Penicillins Anaphylaxis   Codeine Nausea Only    severe   Cortisone     Break out   Sulfa Antibiotics     Family History  Problem Relation Age of Onset   Cancer Father     Prior to Admission medications   Medication Sig Start Date End Date Taking? Authorizing Provider  ALPRAZolam (XANAX) 0.25 MG tablet Take 1 tablet (0.25 mg total) by mouth 2 (two) times daily as needed for anxiety (Anxiety or delirium). Patient taking differently: Take 0.25 mg by mouth every 12 (twelve) hours as needed for anxiety. 10/12/20  Yes Lavina Hamman, MD  amLODipine (NORVASC) 2.5 MG tablet Take 1 tablet (2.5 mg total) by mouth daily. 10/13/20  Yes Lavina Hamman, MD  citalopram (CELEXA) 10 MG tablet Take 10 mg by mouth See admin instructions. Qd x 7 days   Yes [provider]  docusate sodium (COLACE) 100 MG capsule Take 1 capsule (100 mg total) by mouth 2 (two) times daily. 10/12/20  Yes Lavina Hamman, MD  latanoprost (XALATAN) 0.005 % ophthalmic solution Place 1 drop into both eyes at bedtime. 09/05/20  Yes [provider]  levothyroxine (SYNTHROID)  175 MCG tablet Take 175 mcg by mouth daily before breakfast.   Yes [provider]  LORazepam (ATIVAN) 0.5 MG tablet Take 0.25 mg by mouth in the morning and at bedtime.   Yes [provider]  metoprolol succinate (TOPROL-XL) 100 MG 24 hr tablet Take 100 mg by mouth daily. 06/03/20  Yes [provider]  NON FORMULARY Take 1 Dose by mouth daily. Health shake supplement daily at 3pm   Yes [provider]  QUEtiapine (SEROQUEL) 25 MG tablet Take 12.5 mg by mouth 2 (two) times daily. 12/25/20  Yes [provider]  rosuvastatin (CRESTOR) 10 MG tablet Take 10 mg by mouth See admin  instructions. Twice a week on Monday and Friday 06/03/20  Yes [provider]  spironolactone (ALDACTONE) 25 MG tablet Take 25 mg by mouth daily.   Yes [provider]  vitamin B-12 (CYANOCOBALAMIN) 1000 MCG tablet Take 1,000 mcg by mouth daily.   Yes [provider]  warfarin (COUMADIN) 3 MG tablet Take 3 mg by mouth daily.   Yes [provider]  citalopram (CELEXA) 20 MG tablet Take 20 mg by mouth daily.    [provider]  cloNIDine (CATAPRES - DOSED IN MG/24 HR) 0.1 mg/24hr patch Place 1 patch (0.1 mg total) onto the skin once a week. Patient not taking: No sig reported 10/16/20   Lavina Hamman, MD  doxycycline (VIBRAMYCIN) 100 MG capsule Take 1 capsule (100 mg total) by mouth 2 (two) times daily. One po bid x 7 days Patient not taking: Reported on 01/28/2021 12/29/20   Drenda Freeze, MD  levothyroxine (SYNTHROID) 125 MCG tablet Take 1 tablet (125 mcg total) by mouth every morning. Patient not taking: No sig reported 10/13/20   Lavina Hamman, MD  levothyroxine (SYNTHROID) 150 MCG tablet Take 150 mcg by mouth every morning. Patient not taking: No sig reported 12/21/20   [provider]  RIVAROXABAN Alveda Reasons) VTE STARTER PACK (15 & 20 MG) Follow package directions: Take one 15mg  tablet by mouth twice a day. On day 22, switch to one 20mg  tablet once a day. Take with food. Patient not taking: No sig reported 12/29/20   Drenda Freeze, MD  warfarin (COUMADIN) 2 MG tablet Take 2 mg by mouth daily. Patient not taking: No sig reported    [provider]    Physical Exam: Constitutional: Moderately built and nourished. Vitals:   01/27/21 2130 01/27/21 2238 01/28/21 0023 01/28/21 0230  BP:  (!) 164/78 (!) 190/88 (!) 196/100  Pulse: 80 (!) 59 62 74  Resp:  18 (!) 21 18  Temp:   98.2 F (36.8 C)   TempSrc:   Oral   SpO2: 98% 98% 100% 98%  Weight:      Height:       Eyes: Anicteric no pallor. ENMT: No discharge from the  ears eyes nose and mouth. Neck: No mass felt.  No neck rigidity. Respiratory: No rhonchi or crepitations. Cardiovascular: S1-S2 heard. Abdomen: Soft nontender bowel sound present. Musculoskeletal: Left lower extremity is externally rotated. Skin: No rash. Neurologic: Alert awake oriented to her name.  Moving all extremities. Psychiatric: Has dementia.   Labs on Admission: I have personally reviewed following labs and imaging studies  CBC: Recent Labs  Lab 01/27/21 1745  WBC 7.7  HGB 11.6*  HCT 37.9  MCV 101.6*  PLT [id number]   Basic Metabolic Panel: Recent Labs  Lab 01/27/21 1745  NA 139  K 4.9  CL  106  CO2 25  GLUCOSE 101*  BUN 40*  CREATININE 1.54*  CALCIUM 9.3   GFR: Estimated Creatinine Clearance: 24.1 mL/min (A) (by C-G formula based on SCr of 1.54 mg/dL (H)). Liver Function Tests: Recent Labs  Lab 01/27/21 1745  AST 28  ALT 11  ALKPHOS 56  BILITOT 0.4  PROT 6.9  ALBUMIN 3.4*   No results for input(s): LIPASE, AMYLASE in the last 168 hours. No results for input(s): AMMONIA in the last 168 hours. Coagulation Profile: Recent Labs  Lab 01/27/21 1745  INR 1.1   Cardiac Enzymes: No results for input(s): CKTOTAL, CKMB, CKMBINDEX, TROPONINI in the last 168 hours. BNP (last 3 results) No results for input(s): PROBNP in the last 8760 hours. HbA1C: No results for input(s): HGBA1C in the last 72 hours. CBG: No results for input(s): GLUCAP in the last 168 hours. Lipid Profile: No results for input(s): CHOL, HDL, LDLCALC, TRIG, CHOLHDL, LDLDIRECT in the last 72 hours. Thyroid Function Tests: No results for input(s): TSH, T4TOTAL, FREET4, T3FREE, THYROIDAB in the last 72 hours. Anemia Panel: No results for input(s): VITAMINB12, FOLATE, FERRITIN, TIBC, IRON, RETICCTPCT in the last 72 hours. Urine analysis:    Component Value Date/Time   COLORURINE COLORLESS (A) 01/27/2021 2050   APPEARANCEUR CLEAR 01/27/2021 2050   LABSPEC 1.008 01/27/2021 2050   [place]  7.0 01/27/2021 2050   GLUCOSEU NEGATIVE 01/27/2021 2050   [place] NEGATIVE 01/27/2021 2050   [place] NEGATIVE 01/27/2021 2050   [place] 01/27/2021 2050   PROTEINUR NEGATIVE 01/27/2021 2050   UROBILINOGEN 0.2 12/12/2010 0825   NITRITE NEGATIVE 01/27/2021 2050   LEUKOCYTESUR NEGATIVE 01/27/2021 2050   Sepsis Labs: @LABRCNTIP (procalcitonin:4,lacticidven:4) ) Recent Results (from the past 240 hour(s))  Resp Panel by RT-PCR (Flu A&B, Covid) Nasopharyngeal Swab     Status: None   Collection Time: 01/27/21  5:50 PM   Specimen: Nasopharyngeal Swab; Nasopharyngeal(NP) swabs in vial transport medium  Result Value Ref Range Status   SARS Coronavirus 2 by RT PCR NEGATIVE NEGATIVE Final    Comment: (NOTE) SARS-CoV-2 target nucleic acids are NOT DETECTED.  The SARS-CoV-2 RNA is generally detectable in upper respiratory specimens during the acute phase of infection. The lowest concentration of SARS-CoV-2 viral copies this assay can detect is 138 copies/mL. A negative result does not preclude SARS-Cov-2 infection and should not be used as the sole basis for treatment or other patient management decisions. A negative result may occur with  improper specimen collection/handling, submission of specimen other than nasopharyngeal swab, presence of viral mutation(s) within the areas targeted by this assay, and inadequate number of viral copies(<138 copies/mL). A negative result must be combined with clinical observations, patient history, and epidemiological information. The expected result is Negative.  Fact Sheet for Patients:  EntrepreneurPulse.com.au  Fact Sheet for Healthcare Providers:  IncredibleEmployment.be  This test is no t yet approved or cleared by the [place] FDA and  has been authorized for detection and/or diagnosis of SARS-CoV-2 by FDA under an Emergency Use Authorization (EUA). This EUA will remain  in effect (meaning this  test can be used) for the duration of the COVID-19 declaration under Section 564(b)(1) of the Act, 21 U.S.C.section 360bbb-3(b)(1), unless the authorization is terminated  or revoked sooner.       Influenza A by PCR NEGATIVE NEGATIVE Final   Influenza B by PCR NEGATIVE NEGATIVE Final    Comment: (NOTE) The Xpert Xpress SARS-CoV-2/FLU/RSV plus assay is intended as an aid in the diagnosis of influenza from  Nasopharyngeal swab specimens and should not be used as a sole basis for treatment. Nasal washings and aspirates are unacceptable for Xpert Xpress SARS-CoV-2/FLU/RSV testing.  Fact Sheet for Patients: EntrepreneurPulse.com.au  Fact Sheet for Healthcare Providers: IncredibleEmployment.be  This test is not yet approved or cleared by the [place] FDA and has been authorized for detection and/or diagnosis of SARS-CoV-2 by FDA under an Emergency Use Authorization (EUA). This EUA will remain in effect (meaning this test can be used) for the duration of the COVID-19 declaration under Section 564(b)(1) of the Act, 21 U.S.C. section 360bbb-3(b)(1), unless the authorization is terminated or revoked.  Performed at [facility], [place] [address], [place], [place] [address]      Radiological Exams on Admission: CT Head Wo Contrast  Result Date: 01/28/2021 CLINICAL DATA:  Fall. EXAM: CT HEAD WITHOUT CONTRAST CT CERVICAL SPINE WITHOUT CONTRAST TECHNIQUE: Multidetector CT imaging of the head and cervical spine was performed following the standard protocol without intravenous contrast. Multiplanar CT image reconstructions of the cervical spine were also generated. COMPARISON:  CT head and cervical spine 01/27/2021. FINDINGS: CT HEAD FINDINGS Brain: No evidence of acute infarction, hemorrhage, hydrocephalus, extra-axial collection or mass lesion/mass effect. Again seen is mild diffuse atrophy. There is stable mild periventricular white matter  hypodensity, likely chronic small vessel ischemic change. Vascular: Atherosclerotic calcifications are present within the cavernous internal carotid arteries. Skull: Normal. Negative for fracture or focal lesion. Sinuses/Orbits: No acute finding. Other: None. CT CERVICAL SPINE FINDINGS Alignment: There is stable grade 1 anterolisthesis at C3-C4 and C4-C5 which is favored as degenerative. Spinal alignment is otherwise within normal limits. Skull base and vertebrae: No acute fracture. No primary bone lesion or focal pathologic process. There is stable mild chronic compression deformity of T1. Soft tissues and spinal canal: No prevertebral fluid or swelling. No visible canal hematoma. Disc levels: Again seen is moderate severe disc space narrowing throughout the cervical spine with endplate osteophyte formation most significant at C5-C6. At C5-C6 there is some mild bilateral neural foraminal stenosis, unchanged. There is no significant central canal stenosis identified at any level. Upper chest: Negative. Other: None. IMPRESSION: No acute intracranial process. No acute fracture or traumatic subluxation of the cervical spine. Electronically Signed   By: Ronney Asters M.D.   On: 01/28/2021 00:01   CT HEAD WO CONTRAST  Result Date: 01/27/2021 CLINICAL DATA:  Head trauma, minor (Age >= 65y); Neck trauma (Age >= 65y) EXAM: CT HEAD WITHOUT CONTRAST CT CERVICAL SPINE WITHOUT CONTRAST TECHNIQUE: Multidetector CT imaging of the head and cervical spine was performed following the standard protocol without intravenous contrast. Multiplanar CT image reconstructions of the cervical spine were also generated. COMPARISON:  12/29/2020 FINDINGS: CT HEAD FINDINGS Brain: Normal anatomic configuration. Parenchymal volume loss is commensurate with the patient's age. Mild periventricular white matter changes are present likely reflecting the sequela of small vessel ischemia. No abnormal intra or extra-axial mass lesion or fluid  collection. No abnormal mass effect or midline shift. No evidence of acute intracranial hemorrhage or infarct. Ventricular size is normal. Cerebellum unremarkable. Vascular: No asymmetric hyperdense vasculature at the skull base. Advanced vascular calcifications are noted within the carotid siphons and terminal vertebral arteries. Skull: Intact Sinuses/Orbits: Paranasal sinuses are clear. The ocular lenses have been removed. Orbits are otherwise unremarkable. Other: Mastoid air cells and middle ear cavities are clear. CT CERVICAL SPINE FINDINGS Alignment: There is straightening of the cervical spine, unchanged from prior examination. There is stable anterolisthesis of C3 upon C4  and C4 upon C5 of approximately 2-3 mm. Stable retrolisthesis of C5 upon C6 of 3 mm. Skull base and vertebrae: Craniocervical alignment is normal. The atlantodental interval is not widened. There are advanced degenerative changes involving the atlantodental articulation as well as the right atlantoaxial articulation. Moderate degenerative arthritis noted involving the occipitoatlantal articulation bilaterally as well as the left atlantoaxial articulation. No acute fracture of the cervical spine. Remote superior endplate fracture of T1 with mild loss of height is unchanged. There is again noted ankylosis of the right C2-3, C4-5, and C7-T1 facets as well as the left C2-3 and C4-5 facet joints. Soft tissues and spinal canal: No prevertebral fluid or swelling. No visible canal hematoma. No significant canal stenosis. Disc levels: There is marked intervertebral disc space narrowing and endplate remodeling throughout the cervical spine, most severe at C4-C7 in keeping with changes of moderate to severe degenerative disc disease. The prevertebral soft tissues are not thickened on sagittal reformats. Review of the axial images demonstrates multilevel advanced uncovertebral and facet arthrosis resulting in mild-to-moderate left neuroforaminal  narrowing at C3-4, mild right and moderate left neuroforaminal narrowing at C5-6. Upper chest: Asymmetric right apical parenchymal scarring. Moderate atherosclerotic calcification within the right carotid bifurcation. Other: None IMPRESSION: No acute intracranial injury.  No calvarial fracture. No acute fracture or listhesis of the cervical spine. Electronically Signed   By: Fidela Salisbury M.D.   On: 01/27/2021 19:51   CT Cervical Spine Wo Contrast  Result Date: 01/28/2021 CLINICAL DATA:  Fall. EXAM: CT HEAD WITHOUT CONTRAST CT CERVICAL SPINE WITHOUT CONTRAST TECHNIQUE: Multidetector CT imaging of the head and cervical spine was performed following the standard protocol without intravenous contrast. Multiplanar CT image reconstructions of the cervical spine were also generated. COMPARISON:  CT head and cervical spine 01/27/2021. FINDINGS: CT HEAD FINDINGS Brain: No evidence of acute infarction, hemorrhage, hydrocephalus, extra-axial collection or mass lesion/mass effect. Again seen is mild diffuse atrophy. There is stable mild periventricular white matter hypodensity, likely chronic small vessel ischemic change. Vascular: Atherosclerotic calcifications are present within the cavernous internal carotid arteries. Skull: Normal. Negative for fracture or focal lesion. Sinuses/Orbits: No acute finding. Other: None. CT CERVICAL SPINE FINDINGS Alignment: There is stable grade 1 anterolisthesis at C3-C4 and C4-C5 which is favored as degenerative. Spinal alignment is otherwise within normal limits. Skull base and vertebrae: No acute fracture. No primary bone lesion or focal pathologic process. There is stable mild chronic compression deformity of T1. Soft tissues and spinal canal: No prevertebral fluid or swelling. No visible canal hematoma. Disc levels: Again seen is moderate severe disc space narrowing throughout the cervical spine with endplate osteophyte formation most significant at C5-C6. At C5-C6 there is some  mild bilateral neural foraminal stenosis, unchanged. There is no significant central canal stenosis identified at any level. Upper chest: Negative. Other: None. IMPRESSION: No acute intracranial process. No acute fracture or traumatic subluxation of the cervical spine. Electronically Signed   By: Ronney Asters M.D.   On: 01/28/2021 00:01   CT CERVICAL SPINE WO CONTRAST  Result Date: 01/27/2021 CLINICAL DATA:  Head trauma, minor (Age >= 65y); Neck trauma (Age >= 65y) EXAM: CT HEAD WITHOUT CONTRAST CT CERVICAL SPINE WITHOUT CONTRAST TECHNIQUE: Multidetector CT imaging of the head and cervical spine was performed following the standard protocol without intravenous contrast. Multiplanar CT image reconstructions of the cervical spine were also generated. COMPARISON:  12/29/2020 FINDINGS: CT HEAD FINDINGS Brain: Normal anatomic configuration. Parenchymal volume loss is commensurate with the patient's age. Mild  periventricular white matter changes are present likely reflecting the sequela of small vessel ischemia. No abnormal intra or extra-axial mass lesion or fluid collection. No abnormal mass effect or midline shift. No evidence of acute intracranial hemorrhage or infarct. Ventricular size is normal. Cerebellum unremarkable. Vascular: No asymmetric hyperdense vasculature at the skull base. Advanced vascular calcifications are noted within the carotid siphons and terminal vertebral arteries. Skull: Intact Sinuses/Orbits: Paranasal sinuses are clear. The ocular lenses have been removed. Orbits are otherwise unremarkable. Other: Mastoid air cells and middle ear cavities are clear. CT CERVICAL SPINE FINDINGS Alignment: There is straightening of the cervical spine, unchanged from prior examination. There is stable anterolisthesis of C3 upon C4 and C4 upon C5 of approximately 2-3 mm. Stable retrolisthesis of C5 upon C6 of 3 mm. Skull base and vertebrae: Craniocervical alignment is normal. The atlantodental interval is  not widened. There are advanced degenerative changes involving the atlantodental articulation as well as the right atlantoaxial articulation. Moderate degenerative arthritis noted involving the occipitoatlantal articulation bilaterally as well as the left atlantoaxial articulation. No acute fracture of the cervical spine. Remote superior endplate fracture of T1 with mild loss of height is unchanged. There is again noted ankylosis of the right C2-3, C4-5, and C7-T1 facets as well as the left C2-3 and C4-5 facet joints. Soft tissues and spinal canal: No prevertebral fluid or swelling. No visible canal hematoma. No significant canal stenosis. Disc levels: There is marked intervertebral disc space narrowing and endplate remodeling throughout the cervical spine, most severe at C4-C7 in keeping with changes of moderate to severe degenerative disc disease. The prevertebral soft tissues are not thickened on sagittal reformats. Review of the axial images demonstrates multilevel advanced uncovertebral and facet arthrosis resulting in mild-to-moderate left neuroforaminal narrowing at C3-4, mild right and moderate left neuroforaminal narrowing at C5-6. Upper chest: Asymmetric right apical parenchymal scarring. Moderate atherosclerotic calcification within the right carotid bifurcation. Other: None IMPRESSION: No acute intracranial injury.  No calvarial fracture. No acute fracture or listhesis of the cervical spine. Electronically Signed   By: Helyn Numbers M.D.   On: 01/27/2021 19:51   CT T-SPINE NO CHARGE  Result Date: 01/28/2021 CLINICAL DATA:  Fall EXAM: CT Thoracic and Lumbar spine without contrast TECHNIQUE: Multiplanar CT images of the thoracic and lumbar spine were reconstructed from contemporary CT of the Chest, Abdomen, and Pelvis CONTRAST:  None COMPARISON:  None. FINDINGS: CT THORACIC SPINE FINDINGS Alignment: Normal thoracic kyphosis. Vertebrae: No acute fracture or focal pathologic process. Paraspinal and  other soft tissues: Better evaluated on dedicated CT chest. Disc levels: Mild degenerative changes of the mid/lower thoracic spine. Superior endplate Schmorl's node deformity at T11. Spinal canal is patent. CT LUMBAR SPINE FINDINGS Segmentation: 5 lumbar type vertebral bodies. Alignment: Normal lumbar lordosis. Vertebrae: No acute fracture or focal pathologic process. Paraspinal and other soft tissues: Better evaluated on dedicated CT abdomen/pelvis. Disc levels: Mild multilevel degenerative changes, most prominent at L3-4. Spinal canal is patent. IMPRESSION: No evidence of traumatic injury to the thoracolumbar spine. Mild multilevel degenerative changes, as above. Electronically Signed   By: Charline Bills M.D.   On: 01/28/2021 00:14   CT L-SPINE NO CHARGE  Result Date: 01/28/2021 CLINICAL DATA:  Fall EXAM: CT Thoracic and Lumbar spine without contrast TECHNIQUE: Multiplanar CT images of the thoracic and lumbar spine were reconstructed from contemporary CT of the Chest, Abdomen, and Pelvis CONTRAST:  None COMPARISON:  None. FINDINGS: CT THORACIC SPINE FINDINGS Alignment: Normal thoracic kyphosis. Vertebrae: No acute fracture  or focal pathologic process. Paraspinal and other soft tissues: Better evaluated on dedicated CT chest. Disc levels: Mild degenerative changes of the mid/lower thoracic spine. Superior endplate Schmorl's node deformity at T11. Spinal canal is patent. CT LUMBAR SPINE FINDINGS Segmentation: 5 lumbar type vertebral bodies. Alignment: Normal lumbar lordosis. Vertebrae: No acute fracture or focal pathologic process. Paraspinal and other soft tissues: Better evaluated on dedicated CT abdomen/pelvis. Disc levels: Mild multilevel degenerative changes, most prominent at L3-4. Spinal canal is patent. IMPRESSION: No evidence of traumatic injury to the thoracolumbar spine. Mild multilevel degenerative changes, as above. Electronically Signed   By: Julian Hy M.D.   On: 01/28/2021 00:14    DG Chest Port 1 View  Result Date: 01/27/2021 CLINICAL DATA:  Post fall with left hip pain. EXAM: PORTABLE CHEST 1 VIEW COMPARISON:  12/29/2020 FINDINGS: Stable upper normal heart size. Unchanged mediastinal contours with aortic atherosclerosis. Mitral annulus calcifications. No pneumothorax, pleural effusion, or focal airspace disease. No pulmonary edema. The bones are under mineralized without acute osseous abnormalities. IMPRESSION: No acute chest findings. Electronically Signed   By: Keith Rake M.D.   On: 01/27/2021 18:28   DG Knee Left Port  Result Date: 01/27/2021 CLINICAL DATA:  Fall. EXAM: LEFT FEMUR PORTABLE 1 VIEW; PORTABLE LEFT KNEE - 1-2 VIEW COMPARISON:  The left knee x-ray 12/31/2015. FINDINGS: Left knee: Left knee arthroplasty appears in anatomic alignment. There is no evidence for hardware loosening or acute fracture. There is no significant joint effusion. Vascular calcifications are seen in the soft tissues. Left femur: No acute fracture or dislocation identified. There are vascular calcifications in the soft tissues. IMPRESSION: 1. No acute fracture or dislocation of the left femur or left knee. 2. Left knee arthroplasty in anatomic alignment. Electronically Signed   By: Ronney Asters M.D.   On: 01/27/2021 21:19   DG Hip Unilat W or Wo Pelvis 2-3 Views Left  Result Date: 01/27/2021 CLINICAL DATA:  Post fall with left hip pain. EXAM: DG HIP (WITH OR WITHOUT PELVIS) 2-3V LEFT COMPARISON:  Pelvis radiograph 12/29/2020 FINDINGS: Bones are diffusely under mineralized. No visualized fracture of the pelvis or left hip. Femoral head is seated. Mild left hip osteoarthritis with joint space narrowing and spurring. No visualized pubic rami fracture. Pubic symphysis and sacroiliac joints are congruent. IMPRESSION: No fracture of the pelvis or left hip. Electronically Signed   By: Keith Rake M.D.   On: 01/27/2021 18:30   DG Femur Portable 1 View Left  Result Date:  01/27/2021 CLINICAL DATA:  Fall. EXAM: LEFT FEMUR PORTABLE 1 VIEW; PORTABLE LEFT KNEE - 1-2 VIEW COMPARISON:  The left knee x-ray 12/31/2015. FINDINGS: Left knee: Left knee arthroplasty appears in anatomic alignment. There is no evidence for hardware loosening or acute fracture. There is no significant joint effusion. Vascular calcifications are seen in the soft tissues. Left femur: No acute fracture or dislocation identified. There are vascular calcifications in the soft tissues. IMPRESSION: 1. No acute fracture or dislocation of the left femur or left knee. 2. Left knee arthroplasty in anatomic alignment. Electronically Signed   By: Ronney Asters M.D.   On: 01/27/2021 21:19   CT CHEST ABDOMEN PELVIS WO CONTRAST  Result Date: 01/28/2021 CLINICAL DATA:  Fall, left hip pain EXAM: CT CHEST, ABDOMEN AND PELVIS WITHOUT CONTRAST TECHNIQUE: Multidetector CT imaging of the chest, abdomen and pelvis was performed following the standard protocol without IV contrast. COMPARISON:  None. FINDINGS: CT CHEST FINDINGS Cardiovascular: The heart is normal in size. No pericardial  effusion. No evidence of thoracic aortic aneurysm. Atherosclerotic calcifications of the arch. Mitral valve annular calcifications. Mild coronary atherosclerosis of the LAD. Mediastinum/Nodes: No suspicious mediastinal lymphadenopathy. Visualized thyroid is unremarkable. Lungs/Pleura: Biapical pleural-parenchymal scarring. No suspicious pulmonary nodules. Very mild centrilobular emphysematous changes in the upper lobes. No focal consolidation or aspiration. No pleural effusion or pneumothorax. Musculoskeletal: No fracture is seen. Sternum, clavicles, scapulae, and bilateral ribs are intact. Dedicated thoracic spine has been performed and will be reported separately. CT ABDOMEN PELVIS FINDINGS Hepatobiliary: Scattered hepatic cysts measuring up to 2.9 cm, benign. No perihepatic fluid/hemorrhage. Status post cholecystectomy. No intrahepatic or  extrahepatic duct dilatation. Pancreas: Within normal limits. Spleen: Within normal limits.  No perisplenic fluid/hemorrhage. Adrenals/Urinary Tract: Adrenal glands are within normal limits. Bilateral renal cysts, measuring up to 5.3 cm in the anterior left upper kidney, benign. No hydronephrosis. Bladder is within normal limits. Stomach/Bowel: Stomach is notable for a small hiatal hernia. No evidence of bowel obstruction. Appendix is not discretely visualized. Sigmoid diverticulosis, without evidence of diverticulitis. Vascular/Lymphatic: No evidence of abdominal aortic aneurysm. Atherosclerotic calcifications of the abdominal aorta and branch vessels. No suspicious abdominopelvic lymphadenopathy. Reproductive: Status post hysterectomy. Right ovary is within normal limits. 4.2 cm simple left ovarian cyst (series 3/image 99). No follow-up imaging recommended. Note: This recommendation does not apply to premenarchal patients and to those with increased risk (genetic, family history, elevated tumor markers or other high-risk factors) of ovarian cancer. Reference: JACR 2020 Feb; 17(2):248-254 Other: No abdominopelvic ascites. No hemoperitoneum or free air. Musculoskeletal: Comminuted intertrochanteric left hip fracture with mildly displaced lesser trochanter fragment and mild foreshortening. Overlying minimal subcutaneous bruising/hemorrhage (series 3/image 124). Visualized bony pelvis and right femur are intact. Mild degenerative changes of the bilateral hips. Left gluteal intramuscular lipoma (series 3/image 91), benign. Dedicated lumbar spine has been performed and will be reported separately. IMPRESSION: Comminuted, displaced left hip fracture, as above. Otherwise, no evidence of traumatic injury to the chest/abdomen. Dedicated thoracolumbar spine has been performed and will be reported separately. Electronically Signed   By: Julian Hy M.D.   On: 01/28/2021 00:12    EKG: Independently reviewed.  Wandering  pacemaker with LBBB.  Assessment/Plan Principal Problem:   Closed left hip fracture (HCC) Active Problems:   Dementia (HCC)   CKD (chronic kidney disease), stage III (HCC)   Hypertensive urgency   Hip fracture (HCC)    Left hip fracture status post mechanical fall -we will keep patient n.p.o. and Dr. Mardelle Matte orthopedic surgeon has been consulted and likely for surgery today.  We had to confirm if patient was on any anticoagulants with patient's husband.  Medication list states that patient may be on Coumadin or Xarelto.  Pain relief medications. Hypertensive urgency we will keep patient n.p.o. and IV hydralazine and continue patient's beta-blockers and amlodipine.  Follow blood pressure trends. Macrocytic anemia check anemia panel with next blood draw follow CBC. Chronic kidney disease stage III creatinine appears to be at baseline. Dementia no acute issues at this time. Hypothyroidism restart home medication once patient starts orals. Later confirmed with patient's husband that patient also takes Lasix and spironolactone which we will keep for hold for now and restart after surgery if patient continues to remain stable.   Since patient has a hip fracture will need close monitoring and inpatient status.  Addendum -discussed with patient's husband Mr. Mcmonagle.  Mr. 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  Patient also takes spironolactone and Lasix.  Also confirm that patient is a DNR.  Presently patient's INR is subtherapeutic.    DVT prophylaxis: Patient's INR is subtherapeutic patient takes Coumadin for DVT diagnosed in December 29, 2020.  If orthopedics is not planning to do surgery today then will need bridging heparin before surgery. Code Status: DNR. Family Communication: We will discuss with patient husband. Disposition Plan: Will  need rehab. Consults called: Orthopedics. Admission status: Inpatient.   Eduard Clos MD [facility] Pager [phone].  If 7PM-7AM, please contact night-coverage [email or web address] Password [facility]  01/28/2021, 3:42 AM

## 2021-01-28 NOTE — Progress Notes (Signed)
PROGRESS NOTE                                                                                                                                                                                                             Patient Demographics:    Brylei Precourt, is a 87 y.o. female, DOB - 1929/11/23, OS:[id number]  Outpatient Primary MD for the patient is Pcp, No    LOS - 0  Admit date - 01/27/2021    Chief Complaint  Patient presents with   Fall    On thinners       Brief Narrative (HPI from H&P)  - TAHERA PRENDES is a 87 y.o. female with history of dementia, hypertension, hypothyroidism was brought to the ER after patient had an unwitnessed fall at the living facility, in the ER she was found to have left hip fracture and admitted for further care.   Subjective:    Kelby Aline today has, No headache, No chest pain, No abdominal pain - No Nausea, No new weakness tingling or numbness, no SOB, he realizes that his speech is not at his baseline.   Assessment  & Plan :      Acute left hip fracture after a mechanical unwitnessed fall at SNF - ortho has been consulted Dr. Mardelle Matte is planning to operate today.  Will be a moderate risk for adverse cardiopulmonary outcome due to her age and other comorbidities, this has been explained to patient's son who accepts it.  Post surgery will defer to orthopedics on DVT prophylaxis and weightbearing.  Will require SNF  2.  Advanced underlying dementia.  At risk for delirium.  As needed Haldol, minimize narcotics and benzodiazepines.  Continue home dose Seroquel.  3.  CKD 3B. Creatinine close to baseline continue to monitor.  Resume home dose diuretics once surgery is done  4. HTN.  In poor control due to pain, oral blood pressure medications resumed, as needed hydralazine ordered will monitor.  5.  History of left bundle branch block.  Stable.  6.  Hypothyroidism.  On  Synthroid.   7.  Chronic macrocytic anemia.  Type screen and monitor.  8.  Dyslipidemia.  On statin.       Condition - Extremely Guarded  Family Communication  :  Almyra Free [phone] - 01/29/20  Code Status : DNR  Consults  : Orthopedics  PUD Prophylaxis :   Procedures  :            Disposition Plan  :    Status is: Inpatient  Remains inpatient appropriate because: Acute left hip fracture.  DVT Prophylaxis  :  Per Ortho    Lab Results  Component Value Date   PLT 227 01/27/2021    Diet :  Diet Order             Diet NPO time specified Except for: Sips with Meds  Diet effective now                    Inpatient Medications  Scheduled Meds:  amLODipine  10 mg Oral Daily   docusate sodium  100 mg Oral BID   latanoprost  1 drop Both Eyes QHS   [START ON 01/29/2021] levothyroxine  175 mcg Oral QAC breakfast   metoprolol succinate  100 mg Oral Daily   QUEtiapine  12.5 mg Oral BID   [START ON 01/29/2021] rosuvastatin  10 mg Oral Once per day on Mon Fri   vitamin B-12  1,000 mcg Oral Daily   Continuous Infusions: PRN Meds:.haloperidol lactate, hydrALAZINE, HYDROcodone-acetaminophen, morphine injection, ondansetron (ZOFRAN) IV  Antibiotics  :    Anti-infectives (From admission, onward)    None        Time Spent in minutes  30   Lala Lund M.D on 01/28/2021 at 10:22 AM  To page go to [email or web address]   [facility] -  Office  [phone]  See all Orders from today for further details    Objective:   Vitals:   01/28/21 0230 01/28/21 0642 01/28/21 0743 01/28/21 0938  BP: (!) 196/100 (!) 187/101 (!) 201/97 (!) 161/87  Pulse: 74 88 70 69  Resp: 18 19 (!) 22 14  Temp:      TempSrc:      SpO2: 98% 99% 98% 99%  Weight:      Height:        Wt Readings from Last 3 Encounters:  01/27/21 70.9 kg  10/07/20 74.8 kg  12/21/15 99.3 kg     Intake/Output Summary (Last 24 hours) at 01/28/2021 1022 Last data filed at 01/27/2021  2040 Gross per 24 hour  Intake 100 ml  Output --  Net 100 ml     Physical Exam  Awake Alert, No new F.N deficits, Normal affect .AT,PERRAL Supple Neck, No JVD,   Symmetrical Chest wall movement, Good air movement bilaterally, CTAB RRR,No Gallops,Rubs or new Murmurs,  +ve B.Sounds, Abd Soft, No tenderness,   No Cyanosis, Clubbing or edema     RN pressure injury documentation:     Data Review:    CBC Recent Labs  Lab 01/27/21 1745  WBC 7.7  HGB 11.6*  HCT 37.9  PLT 227  MCV 101.6*  MCH 31.1  MCHC 30.6  RDW 16.2*    Electrolytes Recent Labs  Lab 01/27/21 1745 01/28/21 0730  NA 139  --   K 4.9  --   CL 106  --   CO2 25  --   GLUCOSE 101*  --   BUN 40*  --   CREATININE 1.54*  --   CALCIUM 9.3  --   AST 28  --   ALT 11  --   ALKPHOS 56  --   BILITOT 0.4  --   ALBUMIN 3.4*  --   INR 1.1 1.2    ------------------------------------------------------------------------------------------------------------------ No results  for input(s): CHOL, HDL, LDLCALC, TRIG, CHOLHDL, LDLDIRECT in the last 72 hours.  Lab Results  Component Value Date   HGBA1C 6.3 (H) 05/09/2015    No results for input(s): TSH, T4TOTAL, T3FREE, THYROIDAB in the last 72 hours.  Invalid input(s): FREET3 ------------------------------------------------------------------------------------------------------------------ ID Labs Recent Labs  Lab 01/27/21 1745  WBC 7.7  PLT 227  CREATININE 1.54*   Cardiac Enzymes No results for input(s): CKMB, TROPONINI, MYOGLOBIN in the last 168 hours.  Invalid input(s): CK   Radiology Reports CT Head Wo Contrast  Result Date: 01/28/2021 CLINICAL DATA:  Fall. EXAM: CT HEAD WITHOUT CONTRAST CT CERVICAL SPINE WITHOUT CONTRAST TECHNIQUE: Multidetector CT imaging of the head and cervical spine was performed following the standard protocol without intravenous contrast. Multiplanar CT image reconstructions of the cervical spine were also generated.  COMPARISON:  CT head and cervical spine 01/27/2021. FINDINGS: CT HEAD FINDINGS Brain: No evidence of acute infarction, hemorrhage, hydrocephalus, extra-axial collection or mass lesion/mass effect. Again seen is mild diffuse atrophy. There is stable mild periventricular white matter hypodensity, likely chronic small vessel ischemic change. Vascular: Atherosclerotic calcifications are present within the cavernous internal carotid arteries. Skull: Normal. Negative for fracture or focal lesion. Sinuses/Orbits: No acute finding. Other: None. CT CERVICAL SPINE FINDINGS Alignment: There is stable grade 1 anterolisthesis at C3-C4 and C4-C5 which is favored as degenerative. Spinal alignment is otherwise within normal limits. Skull base and vertebrae: No acute fracture. No primary bone lesion or focal pathologic process. There is stable mild chronic compression deformity of T1. Soft tissues and spinal canal: No prevertebral fluid or swelling. No visible canal hematoma. Disc levels: Again seen is moderate severe disc space narrowing throughout the cervical spine with endplate osteophyte formation most significant at C5-C6. At C5-C6 there is some mild bilateral neural foraminal stenosis, unchanged. There is no significant central canal stenosis identified at any level. Upper chest: Negative. Other: None. IMPRESSION: No acute intracranial process. No acute fracture or traumatic subluxation of the cervical spine. Electronically Signed   By: Darliss Cheney M.D.   On: 01/28/2021 00:01   CT HEAD WO CONTRAST  Result Date: 01/27/2021 CLINICAL DATA:  Head trauma, minor (Age >= 65y); Neck trauma (Age >= 65y) EXAM: CT HEAD WITHOUT CONTRAST CT CERVICAL SPINE WITHOUT CONTRAST TECHNIQUE: Multidetector CT imaging of the head and cervical spine was performed following the standard protocol without intravenous contrast. Multiplanar CT image reconstructions of the cervical spine were also generated. COMPARISON:  12/29/2020 FINDINGS: CT HEAD  FINDINGS Brain: Normal anatomic configuration. Parenchymal volume loss is commensurate with the patient's age. Mild periventricular white matter changes are present likely reflecting the sequela of small vessel ischemia. No abnormal intra or extra-axial mass lesion or fluid collection. No abnormal mass effect or midline shift. No evidence of acute intracranial hemorrhage or infarct. Ventricular size is normal. Cerebellum unremarkable. Vascular: No asymmetric hyperdense vasculature at the skull base. Advanced vascular calcifications are noted within the carotid siphons and terminal vertebral arteries. Skull: Intact Sinuses/Orbits: Paranasal sinuses are clear. The ocular lenses have been removed. Orbits are otherwise unremarkable. Other: Mastoid air cells and middle ear cavities are clear. CT CERVICAL SPINE FINDINGS Alignment: There is straightening of the cervical spine, unchanged from prior examination. There is stable anterolisthesis of C3 upon C4 and C4 upon C5 of approximately 2-3 mm. Stable retrolisthesis of C5 upon C6 of 3 mm. Skull base and vertebrae: Craniocervical alignment is normal. The atlantodental interval is not widened. There are advanced degenerative changes involving the atlantodental articulation as  well as the right atlantoaxial articulation. Moderate degenerative arthritis noted involving the occipitoatlantal articulation bilaterally as well as the left atlantoaxial articulation. No acute fracture of the cervical spine. Remote superior endplate fracture of T1 with mild loss of height is unchanged. There is again noted ankylosis of the right C2-3, C4-5, and C7-T1 facets as well as the left C2-3 and C4-5 facet joints. Soft tissues and spinal canal: No prevertebral fluid or swelling. No visible canal hematoma. No significant canal stenosis. Disc levels: There is marked intervertebral disc space narrowing and endplate remodeling throughout the cervical spine, most severe at C4-C7 in keeping with  changes of moderate to severe degenerative disc disease. The prevertebral soft tissues are not thickened on sagittal reformats. Review of the axial images demonstrates multilevel advanced uncovertebral and facet arthrosis resulting in mild-to-moderate left neuroforaminal narrowing at C3-4, mild right and moderate left neuroforaminal narrowing at C5-6. Upper chest: Asymmetric right apical parenchymal scarring. Moderate atherosclerotic calcification within the right carotid bifurcation. Other: None IMPRESSION: No acute intracranial injury.  No calvarial fracture. No acute fracture or listhesis of the cervical spine. Electronically Signed   By: Fidela Salisbury M.D.   On: 01/27/2021 19:51   CT Cervical Spine Wo Contrast  Result Date: 01/28/2021 CLINICAL DATA:  Fall. EXAM: CT HEAD WITHOUT CONTRAST CT CERVICAL SPINE WITHOUT CONTRAST TECHNIQUE: Multidetector CT imaging of the head and cervical spine was performed following the standard protocol without intravenous contrast. Multiplanar CT image reconstructions of the cervical spine were also generated. COMPARISON:  CT head and cervical spine 01/27/2021. FINDINGS: CT HEAD FINDINGS Brain: No evidence of acute infarction, hemorrhage, hydrocephalus, extra-axial collection or mass lesion/mass effect. Again seen is mild diffuse atrophy. There is stable mild periventricular white matter hypodensity, likely chronic small vessel ischemic change. Vascular: Atherosclerotic calcifications are present within the cavernous internal carotid arteries. Skull: Normal. Negative for fracture or focal lesion. Sinuses/Orbits: No acute finding. Other: None. CT CERVICAL SPINE FINDINGS Alignment: There is stable grade 1 anterolisthesis at C3-C4 and C4-C5 which is favored as degenerative. Spinal alignment is otherwise within normal limits. Skull base and vertebrae: No acute fracture. No primary bone lesion or focal pathologic process. There is stable mild chronic compression deformity of T1.  Soft tissues and spinal canal: No prevertebral fluid or swelling. No visible canal hematoma. Disc levels: Again seen is moderate severe disc space narrowing throughout the cervical spine with endplate osteophyte formation most significant at C5-C6. At C5-C6 there is some mild bilateral neural foraminal stenosis, unchanged. There is no significant central canal stenosis identified at any level. Upper chest: Negative. Other: None. IMPRESSION: No acute intracranial process. No acute fracture or traumatic subluxation of the cervical spine. Electronically Signed   By: Ronney Asters M.D.   On: 01/28/2021 00:01   CT CERVICAL SPINE WO CONTRAST  Result Date: 01/27/2021 CLINICAL DATA:  Head trauma, minor (Age >= 65y); Neck trauma (Age >= 65y) EXAM: CT HEAD WITHOUT CONTRAST CT CERVICAL SPINE WITHOUT CONTRAST TECHNIQUE: Multidetector CT imaging of the head and cervical spine was performed following the standard protocol without intravenous contrast. Multiplanar CT image reconstructions of the cervical spine were also generated. COMPARISON:  12/29/2020 FINDINGS: CT HEAD FINDINGS Brain: Normal anatomic configuration. Parenchymal volume loss is commensurate with the patient's age. Mild periventricular white matter changes are present likely reflecting the sequela of small vessel ischemia. No abnormal intra or extra-axial mass lesion or fluid collection. No abnormal mass effect or midline shift. No evidence of acute intracranial hemorrhage or infarct. Ventricular size  is normal. Cerebellum unremarkable. Vascular: No asymmetric hyperdense vasculature at the skull base. Advanced vascular calcifications are noted within the carotid siphons and terminal vertebral arteries. Skull: Intact Sinuses/Orbits: Paranasal sinuses are clear. The ocular lenses have been removed. Orbits are otherwise unremarkable. Other: Mastoid air cells and middle ear cavities are clear. CT CERVICAL SPINE FINDINGS Alignment: There is straightening of the  cervical spine, unchanged from prior examination. There is stable anterolisthesis of C3 upon C4 and C4 upon C5 of approximately 2-3 mm. Stable retrolisthesis of C5 upon C6 of 3 mm. Skull base and vertebrae: Craniocervical alignment is normal. The atlantodental interval is not widened. There are advanced degenerative changes involving the atlantodental articulation as well as the right atlantoaxial articulation. Moderate degenerative arthritis noted involving the occipitoatlantal articulation bilaterally as well as the left atlantoaxial articulation. No acute fracture of the cervical spine. Remote superior endplate fracture of T1 with mild loss of height is unchanged. There is again noted ankylosis of the right C2-3, C4-5, and C7-T1 facets as well as the left C2-3 and C4-5 facet joints. Soft tissues and spinal canal: No prevertebral fluid or swelling. No visible canal hematoma. No significant canal stenosis. Disc levels: There is marked intervertebral disc space narrowing and endplate remodeling throughout the cervical spine, most severe at C4-C7 in keeping with changes of moderate to severe degenerative disc disease. The prevertebral soft tissues are not thickened on sagittal reformats. Review of the axial images demonstrates multilevel advanced uncovertebral and facet arthrosis resulting in mild-to-moderate left neuroforaminal narrowing at C3-4, mild right and moderate left neuroforaminal narrowing at C5-6. Upper chest: Asymmetric right apical parenchymal scarring. Moderate atherosclerotic calcification within the right carotid bifurcation. Other: None IMPRESSION: No acute intracranial injury.  No calvarial fracture. No acute fracture or listhesis of the cervical spine. Electronically Signed   By: Fidela Salisbury M.D.   On: 01/27/2021 19:51   CT T-SPINE NO CHARGE  Result Date: 01/28/2021 CLINICAL DATA:  Fall EXAM: CT Thoracic and Lumbar spine without contrast TECHNIQUE: Multiplanar CT images of the thoracic and  lumbar spine were reconstructed from contemporary CT of the Chest, Abdomen, and Pelvis CONTRAST:  None COMPARISON:  None. FINDINGS: CT THORACIC SPINE FINDINGS Alignment: Normal thoracic kyphosis. Vertebrae: No acute fracture or focal pathologic process. Paraspinal and other soft tissues: Better evaluated on dedicated CT chest. Disc levels: Mild degenerative changes of the mid/lower thoracic spine. Superior endplate Schmorl's node deformity at T11. Spinal canal is patent. CT LUMBAR SPINE FINDINGS Segmentation: 5 lumbar type vertebral bodies. Alignment: Normal lumbar lordosis. Vertebrae: No acute fracture or focal pathologic process. Paraspinal and other soft tissues: Better evaluated on dedicated CT abdomen/pelvis. Disc levels: Mild multilevel degenerative changes, most prominent at L3-4. Spinal canal is patent. IMPRESSION: No evidence of traumatic injury to the thoracolumbar spine. Mild multilevel degenerative changes, as above. Electronically Signed   By: Julian Hy M.D.   On: 01/28/2021 00:14   CT L-SPINE NO CHARGE  Result Date: 01/28/2021 CLINICAL DATA:  Fall EXAM: CT Thoracic and Lumbar spine without contrast TECHNIQUE: Multiplanar CT images of the thoracic and lumbar spine were reconstructed from contemporary CT of the Chest, Abdomen, and Pelvis CONTRAST:  None COMPARISON:  None. FINDINGS: CT THORACIC SPINE FINDINGS Alignment: Normal thoracic kyphosis. Vertebrae: No acute fracture or focal pathologic process. Paraspinal and other soft tissues: Better evaluated on dedicated CT chest. Disc levels: Mild degenerative changes of the mid/lower thoracic spine. Superior endplate Schmorl's node deformity at T11. Spinal canal is patent. CT LUMBAR SPINE FINDINGS Segmentation:  5 lumbar type vertebral bodies. Alignment: Normal lumbar lordosis. Vertebrae: No acute fracture or focal pathologic process. Paraspinal and other soft tissues: Better evaluated on dedicated CT abdomen/pelvis. Disc levels: Mild multilevel  degenerative changes, most prominent at L3-4. Spinal canal is patent. IMPRESSION: No evidence of traumatic injury to the thoracolumbar spine. Mild multilevel degenerative changes, as above. Electronically Signed   By: Julian Hy M.D.   On: 01/28/2021 00:14   DG Chest Port 1 View  Result Date: 01/27/2021 CLINICAL DATA:  Post fall with left hip pain. EXAM: PORTABLE CHEST 1 VIEW COMPARISON:  12/29/2020 FINDINGS: Stable upper normal heart size. Unchanged mediastinal contours with aortic atherosclerosis. Mitral annulus calcifications. No pneumothorax, pleural effusion, or focal airspace disease. No pulmonary edema. The bones are under mineralized without acute osseous abnormalities. IMPRESSION: No acute chest findings. Electronically Signed   By: Keith Rake M.D.   On: 01/27/2021 18:28   DG Knee Left Port  Result Date: 01/27/2021 CLINICAL DATA:  Fall. EXAM: LEFT FEMUR PORTABLE 1 VIEW; PORTABLE LEFT KNEE - 1-2 VIEW COMPARISON:  The left knee x-ray 12/31/2015. FINDINGS: Left knee: Left knee arthroplasty appears in anatomic alignment. There is no evidence for hardware loosening or acute fracture. There is no significant joint effusion. Vascular calcifications are seen in the soft tissues. Left femur: No acute fracture or dislocation identified. There are vascular calcifications in the soft tissues. IMPRESSION: 1. No acute fracture or dislocation of the left femur or left knee. 2. Left knee arthroplasty in anatomic alignment. Electronically Signed   By: Ronney Asters M.D.   On: 01/27/2021 21:19   DG Hip Unilat W or Wo Pelvis 2-3 Views Left  Result Date: 01/27/2021 CLINICAL DATA:  Post fall with left hip pain. EXAM: DG HIP (WITH OR WITHOUT PELVIS) 2-3V LEFT COMPARISON:  Pelvis radiograph 12/29/2020 FINDINGS: Bones are diffusely under mineralized. No visualized fracture of the pelvis or left hip. Femoral head is seated. Mild left hip osteoarthritis with joint space narrowing and spurring. No  visualized pubic rami fracture. Pubic symphysis and sacroiliac joints are congruent. IMPRESSION: No fracture of the pelvis or left hip. Electronically Signed   By: Keith Rake M.D.   On: 01/27/2021 18:30   DG Femur Portable 1 View Left  Result Date: 01/27/2021 CLINICAL DATA:  Fall. EXAM: LEFT FEMUR PORTABLE 1 VIEW; PORTABLE LEFT KNEE - 1-2 VIEW COMPARISON:  The left knee x-ray 12/31/2015. FINDINGS: Left knee: Left knee arthroplasty appears in anatomic alignment. There is no evidence for hardware loosening or acute fracture. There is no significant joint effusion. Vascular calcifications are seen in the soft tissues. Left femur: No acute fracture or dislocation identified. There are vascular calcifications in the soft tissues. IMPRESSION: 1. No acute fracture or dislocation of the left femur or left knee. 2. Left knee arthroplasty in anatomic alignment. Electronically Signed   By: Ronney Asters M.D.   On: 01/27/2021 21:19   CT CHEST ABDOMEN PELVIS WO CONTRAST  Result Date: 01/28/2021 CLINICAL DATA:  Fall, left hip pain EXAM: CT CHEST, ABDOMEN AND PELVIS WITHOUT CONTRAST TECHNIQUE: Multidetector CT imaging of the chest, abdomen and pelvis was performed following the standard protocol without IV contrast. COMPARISON:  None. FINDINGS: CT CHEST FINDINGS Cardiovascular: The heart is normal in size. No pericardial effusion. No evidence of thoracic aortic aneurysm. Atherosclerotic calcifications of the arch. Mitral valve annular calcifications. Mild coronary atherosclerosis of the LAD. Mediastinum/Nodes: No suspicious mediastinal lymphadenopathy. Visualized thyroid is unremarkable. Lungs/Pleura: Biapical pleural-parenchymal scarring. No suspicious pulmonary nodules. Very mild  centrilobular emphysematous changes in the upper lobes. No focal consolidation or aspiration. No pleural effusion or pneumothorax. Musculoskeletal: No fracture is seen. Sternum, clavicles, scapulae, and bilateral ribs are intact.  Dedicated thoracic spine has been performed and will be reported separately. CT ABDOMEN PELVIS FINDINGS Hepatobiliary: Scattered hepatic cysts measuring up to 2.9 cm, benign. No perihepatic fluid/hemorrhage. Status post cholecystectomy. No intrahepatic or extrahepatic duct dilatation. Pancreas: Within normal limits. Spleen: Within normal limits.  No perisplenic fluid/hemorrhage. Adrenals/Urinary Tract: Adrenal glands are within normal limits. Bilateral renal cysts, measuring up to 5.3 cm in the anterior left upper kidney, benign. No hydronephrosis. Bladder is within normal limits. Stomach/Bowel: Stomach is notable for a small hiatal hernia. No evidence of bowel obstruction. Appendix is not discretely visualized. Sigmoid diverticulosis, without evidence of diverticulitis. Vascular/Lymphatic: No evidence of abdominal aortic aneurysm. Atherosclerotic calcifications of the abdominal aorta and branch vessels. No suspicious abdominopelvic lymphadenopathy. Reproductive: Status post hysterectomy. Right ovary is within normal limits. 4.2 cm simple left ovarian cyst (series 3/image 99). No follow-up imaging recommended. Note: This recommendation does not apply to premenarchal patients and to those with increased risk (genetic, family history, elevated tumor markers or other high-risk factors) of ovarian cancer. Reference: JACR 2020 Feb; 17(2):248-254 Other: No abdominopelvic ascites. No hemoperitoneum or free air. Musculoskeletal: Comminuted intertrochanteric left hip fracture with mildly displaced lesser trochanter fragment and mild foreshortening. Overlying minimal subcutaneous bruising/hemorrhage (series 3/image 124). Visualized bony pelvis and right femur are intact. Mild degenerative changes of the bilateral hips. Left gluteal intramuscular lipoma (series 3/image 91), benign. Dedicated lumbar spine has been performed and will be reported separately. IMPRESSION: Comminuted, displaced left hip fracture, as above.  Otherwise, no evidence of traumatic injury to the chest/abdomen. Dedicated thoracolumbar spine has been performed and will be reported separately. Electronically Signed   By: Julian Hy M.D.   On: 01/28/2021 00:12

## 2021-01-28 NOTE — Progress Notes (Signed)
PHARMACY NOTE:  ANTIMICROBIAL RENAL DOSAGE ADJUSTMENT  Current antimicrobial regimen includes a mismatch between antimicrobial dosage and estimated renal function.  As per policy approved by the Pharmacy & Therapeutics and Medical Executive Committees, the antimicrobial dosage will be adjusted accordingly.  Current antimicrobial dosage:  Vancomycin 1gm IV q12h x 1  Indication: surgical prophylaxis  Renal Function:  Estimated Creatinine Clearance: 24.1 mL/min (A) (by C-G formula based on SCr of 1.54 mg/dL (H)). []      On intermittent HD, scheduled: []      On CRRT    Antimicrobial dosage has been changed to:  Vancomycin 1gm IV q24h x 1 (24 hours from preop dose)  Additional comments:   Dacy Enrico A. , PharmD, BCPS, FNKF Clinical Pharmacist  Please utilize Amion for appropriate phone number to reach the unit pharmacist [facility])  01/28/2021 4:15 PM

## 2021-01-28 NOTE — Transfer of Care (Signed)
Immediate Anesthesia Transfer of Care Note  Patient: [name]  Procedure(s) Performed: INTRAMEDULLARY (IM) NAIL INTERTROCHANTRIC (Left: Hip)  Patient Location: PACU  Anesthesia Type:General  Level of Consciousness: drowsy  Airway & Oxygen Therapy: Patient Spontanous Breathing  Post-op Assessment: Report given to RN and Post -op Vital signs reviewed and stable  Post vital signs: Reviewed and stable  Last Vitals:  Vitals Value Taken Time  BP 161/64 01/28/21 1511  Temp    Pulse 50 01/28/21 1516  Resp 19 01/28/21 1516  SpO2 100 % 01/28/21 1516  Vitals shown include unvalidated device data.  Last Pain:  Vitals:   01/28/21 1222  TempSrc: Oral         Complications: No notable events documented.

## 2021-01-28 NOTE — ED Notes (Signed)
The pt has not slept at all she has pulled all  pure wicks off then throws them  we just changed her bed and cleaned urine from her.  Pure replaced  she will not keep it long  keeps placing her hands under the cover going after the pure wick I just medicated her

## 2021-01-28 NOTE — Op Note (Signed)
DATE OF SURGERY:  01/28/2021  TIME: 2:47 PM  PATIENT NAME:  [name]  AGE: 87 y.o.  PRE-OPERATIVE DIAGNOSIS: Complex left basicervical intertrochanteric hip fracture  POST-OPERATIVE DIAGNOSIS:  SAME  PROCEDURE: Left hip trochanteric femoral nailing  SURGEON:  Eulas Post  ASSISTANT:  Levester Fresh, PA-C, present and scrubbed throughout the case, critical for assistance with exposure, retraction, instrumentation, and closure.  OPERATIVE IMPLANTS: Synthes short 10 mm nail with a helical blade and 36 mm distal interlocking bolt  UNIQUE ASPECTS OF THE CASE: The fracture was extremely comminuted and unstable, the trochanter tended to sag, and I had to elevate it during placement of the initial guidepin in order to achieve alignment.  Additionally, she had a basicervical component to this that makes me concerned about the longevity of the femoral head.  I was able to achieve adequate restoration of the head neck shaft anatomy.  ESTIMATED BLOOD LOSS: 75 mL  PREOPERATIVE INDICATIONS:  [name] is a 87 y.o. year old who fell and suffered a hip fracture. She and her power of attorney elected for surgical intervention.  The risks benefits and alternatives were discussed with the patient and husband including but not limited to the risks of nonoperative treatment, versus surgical intervention including infection, bleeding, nerve injury, malunion, nonunion, hardware prominence, hardware failure, need for hardware removal, blood clots, cardiopulmonary complications, morbidity, mortality, among others, and they were willing to proceed.    OPERATIVE PROCEDURE:  The patient was brought to the operating room and placed in the supine position. Anesthesia was administered. She was placed on the fracture table.  Closed reduction was performed under C-arm guidance.  Time out was then performed after sterile prep and drape. She received preoperative antibiotics.  We gave both vancomycin  and Ancef, and she tolerated this well.  Incision was made proximal to the greater trochanter. A guidewire was placed in the appropriate position. Confirmation was made on AP and lateral views.  The above-named nail was opened. I opened the proximal femur with a reamer. I then placed the nail by hand down. I did not need to ream the femur.  Once the nail was completely seated, I placed a guidepin into the femoral head into the center center position. I measured the length, and then reamed the lateral cortex and up into the head. I then placed the cephalomedullary screw.  I did not apply any compression.  Anatomic fixation achieved. Bone quality was mediocre.  I then secured the proximal interlocking bolt, and locked the nail distally using the jig.  I took final C-arm pictures AP and lateral.   Anatomic reconstruction was achieved, and the wounds were irrigated copiously and closed with Vicryl followed by Steri-Strips and sterile gauze for the skin. The patient was awakened and returned to PACU in stable and satisfactory condition. There were no complications and the patient tolerated the procedure well.  She will be weightbearing as tolerated, and will be on chemoprophylaxis of choice for a period of four weeks after discharge.   Teryl Lucy, M.D.

## 2021-01-28 NOTE — Consult Note (Signed)
Time called: 0 315 Time arrived at the bedside: 0 335  ORTHOPAEDIC CONSULTATION  REQUESTING PHYSICIAN: Eduard Clos, MD  Chief Complaint: Left hip pain  HPI: [name] is a 87 y.o. female who complains of left hip pain after a fall, she apparently was brought in after a fall from her memory care facility, had x-rays that were negative, then subsequently had a fall while she was here in the hospital, and then had more left hip pain.  She is not a very good historian, she has advanced dementia, but repeat CAT scan of the left pelvis demonstrated a intertrochanteric hip fracture.  Past Medical History:  Diagnosis Date   Dementia (HCC)    DVT, lower extremity (HCC)    Hemorrhoids    Hypertension    Hypothyroidism    Thyroid disease    Past Surgical History:  Procedure Laterality Date   ABDOMINAL HYSTERECTOMY     APPENDECTOMY     CHOLECYSTECTOMY     JOINT REPLACEMENT     left knee   NO PAST SURGERIES     right knee replacement     TUBAL LIGATION     Social History   Socioeconomic History   Marital status: Married    Spouse name: Not on file   Number of children: Not on file   Years of education: Not on file   Highest education level: Not on file  Occupational History   Not on file  Tobacco Use   Smoking status: Never    Passive exposure: Never   Smokeless tobacco: Never  Vaping Use   Vaping Use: Never used  Substance and Sexual Activity   Alcohol use: No   Drug use: Never   Sexual activity: Not on file  Other Topics Concern   Not on file  Social History Narrative   ** Merged History Encounter **       Social Determinants of Health   Financial Resource Strain: Not on file  Food Insecurity: Not on file  Transportation Needs: Not on file  Physical Activity: Not on file  Stress: Not on file  Social Connections: Not on file   Family History  Problem Relation Age of Onset   Cancer Father    Allergies  Allergen Reactions   Contrast Media  [Iodinated Diagnostic Agents] Other (See Comments)    Causes patient to pass out   Elemental Sulfur Anaphylaxis   Penicillins Anaphylaxis    Has patient had a PCN reaction causing immediate rash, facial/tongue/throat swelling, SOB or lightheadedness with hypotension: Yes Has patient had a PCN reaction causing severe rash involving mucus membranes or skin necrosis:  Unknown Has patient had a PCN reaction that required hospitalization: No  Has patient had a PCN reaction occurring within the last 10 years: No  If all of the above answers are "NO", then may proceed with Cephalosporin use.    Penicillins Anaphylaxis   Codeine Nausea Only    severe   Cortisone     Break out   Sulfa Antibiotics      Positive ROS: All other systems have been reviewed and were otherwise negative with the exception of those mentioned in the HPI and as above.  Physical Exam: General: Alert, no acute distress, she answers questions appropriately Cardiovascular: No pedal edema Respiratory: No cyanosis, no use of accessory musculature GI: No organomegaly, abdomen is soft and non-tender Skin: No lesions in the area of chief complaint, she has bruising over the left anterior knee, as  well as a previous surgical wound that is healed well there. Neurologic: Sensation intact distally Psychiatric: Patient is not competent for consent, but does not follow commands Lymphatic: No axillary or cervical lymphadenopathy  MUSCULOSKELETAL: Right lower extremity does not have significant tenderness, but left lower extremity has significant pain with logrolling over the left hip.  Assessment: Principal Problem:   Closed left hip fracture (HCC) Active Problems:   Dementia ([place])   CKD (chronic kidney disease), stage III ([place])   Hypertensive urgency   Plan: This is an acute significant injury, and I have recommended internal fixation in order to restore her capacity to ambulate as well as to perform hygiene and have basic  level of function.  Plan for surgery later today, I have discussed this with her husband, her POA, and will plan for surgery.   The risks benefits and alternatives were discussed with the patient including but not limited to the risks of nonoperative treatment, versus surgical intervention including infection, bleeding, nerve injury, malunion, nonunion, the need for revision surgery, hardware prominence, hardware failure, the need for hardware removal, blood clots, cardiopulmonary complications, morbidity, mortality, among others, and they were willing to proceed.     Johnny Bridge, MD Cell [phone]   01/28/2021 3:36 AM

## 2021-01-28 NOTE — Anesthesia Preprocedure Evaluation (Addendum)
Anesthesia Evaluation  Patient identified by MRN, date of birth, ID band Patient confused    Reviewed: Allergy & Precautions, NPO status , Patient's Chart, lab work & pertinent test results, reviewed documented beta blocker date and time   Airway Mallampati: III  TM Distance: >3 FB Neck ROM: Full    Dental  (+) Teeth Intact, Dental Advisory Given   Pulmonary asthma ,    Pulmonary exam normal breath sounds clear to auscultation       Cardiovascular hypertension, Pt. on medications and Pt. on home beta blockers + DVT  Normal cardiovascular exam Rhythm:Regular Rate:Normal     Neuro/Psych PSYCHIATRIC DISORDERS Dementia negative neurological ROS     GI/Hepatic negative GI ROS, Neg liver ROS,   Endo/Other  Hypothyroidism   Renal/GU Renal InsufficiencyRenal disease     Musculoskeletal  (+) Arthritis , Fractured Left Hip   Abdominal   Peds  Hematology  (+) Blood dyscrasia (Xarelto; Coumadin), anemia ,   Anesthesia Other Findings   Reproductive/Obstetrics                            Anesthesia Physical Anesthesia Plan  ASA: 3  Anesthesia Plan: General   Post-op Pain Management:    Induction: Intravenous  PONV Risk Score and Plan: 3 and Dexamethasone, Ondansetron and Treatment may vary due to age or medical condition  Airway Management Planned: Oral ETT  Additional Equipment:   Intra-op Plan:   Post-operative Plan: Extubation in OR  Informed Consent: I have reviewed the patients History and Physical, chart, labs and discussed the procedure including the risks, benefits and alternatives for the proposed anesthesia with the patient or authorized representative who has indicated his/her understanding and acceptance.   Patient has DNR.  Discussed DNR with patient, Discussed DNR with power of attorney and Continue DNR.   Consent reviewed with POA  Plan Discussed with: CRNA  Anesthesia  Plan Comments:        Anesthesia Quick Evaluation

## 2021-01-28 NOTE — Anesthesia Postprocedure Evaluation (Signed)
Anesthesia Post Note  Patient: [name]  Procedure(s) Performed: INTRAMEDULLARY (IM) NAIL INTERTROCHANTRIC (Left: Hip)     Patient location during evaluation: PACU Anesthesia Type: General Level of consciousness: confused and awake and alert (Neuro at baseline) Pain management: pain level controlled Vital Signs Assessment: post-procedure vital signs reviewed and stable Respiratory status: spontaneous breathing, nonlabored ventilation and respiratory function stable Cardiovascular status: blood pressure returned to baseline and stable Postop Assessment: no apparent nausea or vomiting Anesthetic complications: no   No notable events documented.  Last Vitals:  Vitals:   01/28/21 1541 01/28/21 1701  BP: 131/85 (!) 158/69  Pulse: (!) 52 (!) 56  Resp: 20 16  Temp: (!) 36.2 C 36.4 C  SpO2: 99% 97%    Last Pain:  Vitals:   01/28/21 1701  TempSrc: Axillary                 Cecile Hearing

## 2021-01-28 NOTE — Progress Notes (Signed)
Left intertrochanteric hip fracture  Plan for surgical intervention with intramedullary nail fixation.  The risks benefits and alternatives were discussed with the patient and her husband, power of attorney, including but not limited to the risks of nonoperative treatment, versus surgical intervention including infection, bleeding, nerve injury, malunion, nonunion, the need for revision surgery, hardware prominence, hardware failure, the need for hardware removal, blood clots, cardiopulmonary complications, morbidity, mortality, among others, and they were willing to proceed.  Michele Lucy, MD

## 2021-01-28 NOTE — Progress Notes (Signed)
   01/28/21 0500  Clinical Encounter Type  Visited With Patient  Visit Type Initial;ED   CH encountered pt. while responding to other emergent events in ED tonight; pt. lying in bed crying out and tearful, very confused and distressed.  Pt. repeatedly asked to sit up in bed and was told she cannot do this due to hip injury.  Pt.'s needs difficult to assess at this time; she says she has no family.  Chaplains remain available for support.

## 2021-01-28 NOTE — ED Notes (Signed)
Attempted to call husband for consent- no answer-- left message. Also spoke with OR nurse-- ANethesia can start IV after 2 unsuccessful attempts.   Anell Barr, RN Trauma Response Nurse [phone]

## 2021-01-29 ENCOUNTER — Encounter (HOSPITAL_COMMUNITY): Payer: Self-pay | Admitting: Orthopedic Surgery

## 2021-01-29 ENCOUNTER — Inpatient Hospital Stay (HOSPITAL_COMMUNITY): Payer: Medicare Other

## 2021-01-29 LAB — CBC WITH DIFFERENTIAL/PLATELET
Abs Immature Granulocytes: 0.1 10*3/uL — ABNORMAL HIGH (ref 0.00–0.07)
Basophils Absolute: 0 10*3/uL (ref 0.0–0.1)
Basophils Relative: 0 %
Eosinophils Absolute: 0 10*3/uL (ref 0.0–0.5)
Eosinophils Relative: 0 %
HCT: 29.8 % — ABNORMAL LOW (ref 36.0–46.0)
Hemoglobin: 9.2 g/dL — ABNORMAL LOW (ref 12.0–15.0)
Immature Granulocytes: 1 %
Lymphocytes Relative: 10 %
Lymphs Abs: 1.2 10*3/uL (ref 0.7–4.0)
MCH: 31.2 pg (ref 26.0–34.0)
MCHC: 30.9 g/dL (ref 30.0–36.0)
MCV: 101 fL — ABNORMAL HIGH (ref 80.0–100.0)
Monocytes Absolute: 1.5 10*3/uL — ABNORMAL HIGH (ref 0.1–1.0)
Monocytes Relative: 11 %
Neutro Abs: 10.2 10*3/uL — ABNORMAL HIGH (ref 1.7–7.7)
Neutrophils Relative %: 78 %
Platelets: 212 10*3/uL (ref 150–400)
RBC: 2.95 MIL/uL — ABNORMAL LOW (ref 3.87–5.11)
RDW: 16 % — ABNORMAL HIGH (ref 11.5–15.5)
WBC: 13 10*3/uL — ABNORMAL HIGH (ref 4.0–10.5)
nRBC: 0 % (ref 0.0–0.2)

## 2021-01-29 LAB — COMPREHENSIVE METABOLIC PANEL
ALT: 14 U/L (ref 0–44)
AST: 28 U/L (ref 15–41)
Albumin: 2.9 g/dL — ABNORMAL LOW (ref 3.5–5.0)
Alkaline Phosphatase: 44 U/L (ref 38–126)
Anion gap: 9 (ref 5–15)
BUN: 39 mg/dL — ABNORMAL HIGH (ref 8–23)
CO2: 23 mmol/L (ref 22–32)
Calcium: 8.4 mg/dL — ABNORMAL LOW (ref 8.9–10.3)
Chloride: 104 mmol/L (ref 98–111)
Creatinine, Ser: 1.92 mg/dL — ABNORMAL HIGH (ref 0.44–1.00)
GFR, Estimated: 25 mL/min — ABNORMAL LOW (ref >60)
Glucose, Bld: 152 mg/dL — ABNORMAL HIGH (ref 70–99)
Potassium: 5.6 mmol/L — ABNORMAL HIGH (ref 3.5–5.1)
Sodium: 136 mmol/L (ref 135–145)
Total Bilirubin: 0.6 mg/dL (ref 0.3–1.2)
Total Protein: 5.9 g/dL — ABNORMAL LOW (ref 6.5–8.1)

## 2021-01-29 LAB — TYPE AND SCREEN
ABO/RH(D): A POS
Antibody Screen: NEGATIVE

## 2021-01-29 LAB — MAGNESIUM: Magnesium: 2 mg/dL (ref 1.7–2.4)

## 2021-01-29 LAB — URIC ACID: Uric Acid, Serum: 9.8 mg/dL — ABNORMAL HIGH (ref 2.5–7.1)

## 2021-01-29 LAB — BRAIN NATRIURETIC PEPTIDE: B Natriuretic Peptide: 497.8 pg/mL — ABNORMAL HIGH (ref 0.0–100.0)

## 2021-01-29 LAB — OSMOLALITY: Osmolality: 310 mOsm/kg — ABNORMAL HIGH (ref 275–295)

## 2021-01-29 LAB — PROTIME-INR
INR: 1.2 (ref 0.8–1.2)
Prothrombin Time: 14.7 seconds (ref 11.4–15.2)

## 2021-01-29 MED ORDER — WARFARIN SODIUM 4 MG PO TABS
4.0000 mg | ORAL_TABLET | Freq: Once | ORAL | Status: AC
  Administered 2021-01-29: 4 mg via ORAL
  Filled 2021-01-29 (×3): qty 1

## 2021-01-29 MED ORDER — ADULT MULTIVITAMIN W/MINERALS CH
1.0000 | ORAL_TABLET | Freq: Every day | ORAL | Status: DC
  Administered 2021-01-29 – 2021-01-30 (×2): 1 via ORAL
  Filled 2021-01-29 (×2): qty 1

## 2021-01-29 MED ORDER — MORPHINE SULFATE (PF) 2 MG/ML IV SOLN: 1.0000 mg | Freq: Three times a day (TID) | INTRAVENOUS | Status: DC | PRN

## 2021-01-29 MED ORDER — WARFARIN - PHARMACIST DOSING INPATIENT: Freq: Every day | Status: DC

## 2021-01-29 MED ORDER — METOPROLOL SUCCINATE ER 25 MG PO TB24: 50.0000 mg | ORAL_TABLET | Freq: Every day | ORAL | Status: DC

## 2021-01-29 MED ORDER — HYDROCODONE-ACETAMINOPHEN 5-325 MG PO TABS
1.0000 | ORAL_TABLET | Freq: Four times a day (QID) | ORAL | Status: DC | PRN
  Administered 2021-01-30 (×2): 1 via ORAL
  Filled 2021-01-29 (×2): qty 1

## 2021-01-29 MED ORDER — HYDROMORPHONE HCL 1 MG/ML IJ SOLN
0.5000 mg | Freq: Three times a day (TID) | INTRAMUSCULAR | Status: DC | PRN
  Administered 2021-01-30: 0.5 mg via INTRAVENOUS
  Filled 2021-01-29: qty 0.5

## 2021-01-29 MED ORDER — SODIUM ZIRCONIUM CYCLOSILICATE 10 G PO PACK
10.0000 g | PACK | Freq: Two times a day (BID) | ORAL | Status: AC
  Administered 2021-01-29 (×2): 10 g via ORAL
  Filled 2021-01-29 (×2): qty 1

## 2021-01-29 MED ORDER — LACTATED RINGERS IV SOLN: INTRAVENOUS | Status: AC

## 2021-01-29 MED ORDER — ENSURE ENLIVE PO LIQD
237.0000 mL | Freq: Two times a day (BID) | ORAL | Status: DC
  Administered 2021-01-29: 237 mL via ORAL

## 2021-01-29 NOTE — Progress Notes (Signed)
Initial Nutrition Assessment  DOCUMENTATION CODES:  Non-severe (moderate) malnutrition in context of chronic illness  INTERVENTION:  Add Ensure Plus High Protein po BID, each supplement provides 350 kcal and 20 grams of protein.  Add MVI with minerals daily.  Encourage PO and supplement intake.  NUTRITION DIAGNOSIS:  Moderate Malnutrition related to chronic illness (dementia) as evidenced by moderate fat depletion, moderate muscle depletion.  GOAL:  Patient will meet greater than or equal to 90% of their needs  MONITOR:  PO intake, Supplement acceptance, Labs, Weight trends, Skin, I & O's  REASON FOR ASSESSMENT:  Consult Hip fracture protocol  ASSESSMENT:  87 yo female with a PMH of dementia, hypertension, hypothyroidism was brought to the ER after patient had an unwitnessed fall at the living facility and was found to have L hip fracture. 11/13 - L hip trochanteric femoral nailing  Attempted to speak with pt at bedside. Pt sound asleep, did not wake to touch or voice.  Per Epic, pt has lost ~8 lbs (5%) in the last 3.5 months, which is not necessarily significant for the time frame.  Recommend adding Ensure BID and MVI with minerals daily.  Medications: reviewed; colace BID, Synthroid, Protonix, Lokelma BID once, Vitamin B12, Warfarin, LR @ 125 ml/hr  Labs: reviewed; K 5.6 (H), Glucose 152 (H)  NUTRITION - FOCUSED PHYSICAL EXAM: Flowsheet Row Most Recent Value  Orbital Region Moderate depletion  Upper Arm Region Moderate depletion  Thoracic and Lumbar Region No depletion  Buccal Region Mild depletion  Temple Region Moderate depletion  Clavicle Bone Region Severe depletion  Clavicle and Acromion Bone Region Moderate depletion  Scapular Bone Region Moderate depletion  Dorsal Hand Mild depletion  Patellar Region Moderate depletion  Anterior Thigh Region Mild depletion  Posterior Calf Region Mild depletion  Edema (RD Assessment) Mild  [RLE]  Hair Reviewed  Eyes  Reviewed  Mouth Reviewed  Skin Reviewed  Nails Reviewed   Diet Order:   Diet Order             DIET SOFT Room service appropriate? Yes; Fluid consistency: Thin; Fluid restriction: 1800 mL Fluid  Diet effective now                  EDUCATION NEEDS:  Not appropriate for education at this time  Skin:  Skin Assessment: Skin Integrity Issues: Skin Integrity Issues:: Incisions Incisions: L hip, closed  Last BM:  PTA  Height:  Ht Readings from Last 1 Encounters:  01/27/21 5\' 7"  (1.702 m)   Weight:  Wt Readings from Last 1 Encounters:  01/27/21 70.9 kg   BMI:  Body mass index is 24.48 kg/m.  Estimated Nutritional Needs:  Kcal:  1700-1900 Protein:  85-100 grams Fluid:  >1.7 L  13/12/22, RD, LDN (she/her/hers) Clinical Inpatient Dietitian RD Pager/After-Hours/Weekend Pager # in [place]

## 2021-01-29 NOTE — Evaluation (Signed)
Physical Therapy Evaluation Patient Details Name: [name] MRN: [id number] DOB: May 10, 1929 Today's Date: 01/29/2021  History of Present Illness  pt is an 87 y/o female admitted 11/12 after and unwitnessed fall in which pt sustained and significantly comminuted fracture of the L hip.  Pt s/p IM nailing 11/13.  PMHx, dementia, HTN, bil TKA's  Clinical Impression  Pt admitted with/for fall, comminuted L hip fx, s/p IM nailing.  Pt requiring total assist of 2 persons for basic mobility..  Pt currently limited functionally due to the problems listed. ( See problems list.)   Pt will benefit from PT to maximize function and safety in order to get ready for next venue listed below.        Recommendations for follow up therapy are one component of a multi-disciplinary discharge planning process, led by the attending physician.  Recommendations may be updated based on patient status, additional functional criteria and insurance authorization.  Follow Up Recommendations Skilled nursing-short term rehab (<3 hours/day)    Assistance Recommended at Discharge Frequent or constant Supervision/Assistance  Functional Status Assessment Patient has had a recent decline in their functional status and demonstrates the ability to make significant improvements in function in a reasonable and predictable amount of time.  Equipment Recommendations  Other (comment) (TBD)    Recommendations for Other Services       Precautions / Restrictions Precautions Precautions: Fall Restrictions Weight Bearing Restrictions: Yes LLE Weight Bearing: Weight bearing as tolerated      Mobility  Bed Mobility Overal bed mobility: Needs Assistance Bed Mobility: Supine to Sit;Sit to Supine     Supine to sit: Total assist;+2 for physical assistance     General bed mobility comments: cues for direction and calming patient.  Use of pad to Pivot/helicopter to EOB and assist to scoot to EOB.  pt needing 2 person total  assist through out.    Transfers Overall transfer level: Needs assistance Equipment used: Rolling walker (2 wheels) Transfers: Sit to/from Stand;Bed to chair/wheelchair/BSC Sit to Stand: +2 physical assistance;Max assist Stand pivot transfers: Total assist;+2 physical assistance         General transfer comment: pt cued verbally/tactilely on direction for stand and transfers, but mostly to keep her fears and anxiety minimized.  Pt needed 2 person total assist once in the RW due to fears of falling and pain.  With signi    Ambulation/Gait               General Gait Details: pt unable to take steps, unable to focus on task to do more than just stay in stance.  Stairs            Wheelchair Mobility    Modified Rankin (Stroke Patients Only)       Balance Overall balance assessment: Needs assistance Sitting-balance support: Single extremity supported;Feet supported Sitting balance-Leahy Scale: Poor Sitting balance - Comments: needs UE support or external support   Standing balance support: Bilateral upper extremity supported;During functional activity Standing balance-Leahy Scale: Zero                               Pertinent Vitals/Pain Breathing: normal Negative Vocalization: occasional moan/groan, low speech, negative/disapproving quality Facial Expression: sad, frightened, frown Body Language: tense, distressed pacing, fidgeting Consolability: distracted or reassured by voice/touch PAINAD Score: 4    Home Living Family/patient expects to be discharged to:: Skilled nursing facility  Prior Function Prior Level of Function : Needs assist  Cognitive Assist : Mobility (cognitive);ADLs (cognitive) Mobility (Cognitive):  (No family available or PLOF found in the chart) ADLs (Cognitive):  (No family available or PLOF stated in the chart.)               Hand Dominance   Dominant Hand: Right     Extremity/Trunk Assessment   Upper Extremity Assessment Upper Extremity Assessment: Generalized weakness    Lower Extremity Assessment Lower Extremity Assessment: Generalized weakness;LLE deficits/detail;RLE deficits/detail RLE Deficits / Details: stiff/resistant to ROM, AAROM knee/hip to 90* up to 10 reps, RLE: Unable to fully assess due to pain RLE Coordination: decreased fine motor LLE Deficits / Details: painful with P/AAROM, limited knee/hip ROM in supine to 30*, but sitting EOB hip/knee to approx 80* LLE: Unable to fully assess due to pain LLE Coordination: decreased fine motor;decreased gross motor    Cervical / Trunk Assessment Cervical / Trunk Assessment: Kyphotic  Communication   Communication: No difficulties  Cognition Arousal/Alertness: Awake/alert Behavior During Therapy: Restless (fidgety) Overall Cognitive Status: History of cognitive impairments - at baseline                                 General Comments: pt is a poor historian, unable to relay anything about her usual environment, PLOF and thinks she lives "with my mother".        General Comments General comments (skin integrity, edema, etc.): pt would not take pain meds given in applesauce.  Pleasantly demented, unable to follow verbal commands without cues and unable to stay focuse on tasks.    Exercises Total Joint Exercises Heel Slides: AAROM;Both;10 reps;Supine Hip ABduction/ADduction: AAROM;10 reps;Supine Other Exercises Other Exercises: shoulder flexion/reaching to ceiling, AAROM x10 bil.   Assessment/Plan    PT Assessment Patient needs continued PT services  PT Problem List Decreased strength;Decreased range of motion;Decreased activity tolerance;Decreased balance;Decreased mobility;Decreased cognition;Decreased knowledge of use of DME;Decreased safety awareness;Pain       PT Treatment Interventions DME instruction;Gait training;Functional mobility training;Therapeutic  activities;Patient/family education    PT Goals (Current goals can be found in the Care Plan section)  Acute Rehab PT Goals Patient Stated Goal: pt unable PT Goal Formulation: Patient unable to participate in goal setting Time For Goal Achievement: 02/12/21 Potential to Achieve Goals: Fair    Frequency Min 3X/week   Barriers to discharge        Co-evaluation               AM-PAC PT "6 Clicks" Mobility  Outcome Measure Help needed turning from your back to your side while in a flat bed without using bedrails?: Total Help needed moving from lying on your back to sitting on the side of a flat bed without using bedrails?: Total Help needed moving to and from a bed to a chair (including a wheelchair)?: Total Help needed standing up from a chair using your arms (e.g., wheelchair or bedside chair)?: Total Help needed to walk in hospital room?: Total Help needed climbing 3-5 steps with a railing? : Total 6 Click Score: 6    End of Session   Activity Tolerance: Patient limited by pain Patient left: in chair;with call bell/phone within reach;with chair alarm set;Other (comment) (lift pad) Nurse Communication: Mobility status PT Visit Diagnosis: Other abnormalities of gait and mobility (R26.89);Muscle weakness (generalized) (M62.81);Difficulty in walking, not elsewhere classified (R26.2)    Time: 8099-8338 PT  Time Calculation (min) (ACUTE ONLY): 39 min   Charges:   PT Evaluation $PT Eval Moderate Complexity: 1 Mod PT Treatments $Therapeutic Exercise: 8-22 mins $Therapeutic Activity: 8-22 mins        01/29/2021  Jacinto Halim., PT Acute Rehabilitation Services [phone]  (pager) [phone]  (office)  Eliseo Gum Zed Wanninger 01/29/2021, 12:40 PM

## 2021-01-29 NOTE — TOC CAGE-AID Note (Signed)
Transition of Care [facility]) - CAGE-AID Screening   Patient Details  Name: [name] MRN: [id number] Date of Birth: [birth year]  Clinical Narrative:  No current alcohol or drug use. No need for resources at this time.  CAGE-AID Screening:    Have You Ever Felt You Ought to Cut Down on Your Drinking or Drug Use?: No Have People Annoyed You By Critizing Your Drinking Or Drug Use?: No Have You Felt Bad Or Guilty About Your Drinking Or Drug Use?: No Have You Ever Had a Drink or Used Drugs First Thing In The Morning to Steady Your Nerves or to Get Rid of a Hangover?: No CAGE-AID Score: 0  Substance Abuse Education Offered: No

## 2021-01-29 NOTE — Progress Notes (Signed)
ANTICOAGULATION CONSULT NOTE - Initial Consult  Pharmacy Consult for warfarin Indication:  hx DVT (12/2020)  Allergies  Allergen Reactions   Contrast Media [Iodinated Diagnostic Agents] Other (See Comments)    Causes patient to pass out   Elemental Sulfur Anaphylaxis   Penicillins Anaphylaxis    Has patient had a PCN reaction causing immediate rash, facial/tongue/throat swelling, SOB or lightheadedness with hypotension: Yes Has patient had a PCN reaction causing severe rash involving mucus membranes or skin necrosis:  Unknown Has patient had a PCN reaction that required hospitalization: No  Has patient had a PCN reaction occurring within the last 10 years: No  If all of the above answers are "NO", then may proceed with Cephalosporin use.    Penicillins Anaphylaxis   Codeine Nausea Only    severe   Cortisone     Break out   Sulfa Antibiotics     Patient Measurements: Height: 5\' 7"  (170.2 cm) Weight: 70.9 kg (156 lb 4.9 oz) IBW/kg (Calculated) : 61.6 Heparin Dosing Weight:   Vital Signs: Temp: 97.8 F (36.6 C) (11/14 0721) Temp Source: Oral (11/14 0721) BP: 126/55 (11/14 1027) Pulse Rate: 58 (11/14 1027)  Labs: Recent Labs    01/27/21 1745 01/27/21 2011 01/28/21 0730 01/29/21 0120 01/29/21 0652  HGB 11.6*  --   --   --  9.2*  HCT 37.9  --   --   --  29.8*  PLT 227  --   --   --  212  LABPROT 14.5  --  14.8 14.7  --   INR 1.1  --  1.2 1.2  --   CREATININE 1.54*  --   --  1.92*  --   TROPONINIHS 14 12  --   --   --     Estimated Creatinine Clearance: 19.3 mL/min (A) (by C-G formula based on SCr of 1.92 mg/dL (H)).   Medical History: Past Medical History:  Diagnosis Date   Dementia (HCC)    DVT, lower extremity (HCC)    Hemorrhoids    Hypertension    Hypothyroidism    Thyroid disease     Medications:  Medications Prior to Admission  Medication Sig Dispense Refill Last Dose   ALPRAZolam (XANAX) 0.25 MG tablet Take 1 tablet (0.25 mg total) by mouth 2  (two) times daily as needed for anxiety (Anxiety or delirium). (Patient taking differently: Take 0.25 mg by mouth every 12 (twelve) hours as needed for anxiety.) 10 tablet 0 01/27/2021   amLODipine (NORVASC) 2.5 MG tablet Take 1 tablet (2.5 mg total) by mouth daily. 30 tablet 0 01/27/2021   citalopram (CELEXA) 10 MG tablet Take 10 mg by mouth See admin instructions. Qd x 7 days   01/27/2021   docusate sodium (COLACE) 100 MG capsule Take 1 capsule (100 mg total) by mouth 2 (two) times daily. 10 capsule 0 01/27/2021   latanoprost (XALATAN) 0.005 % ophthalmic solution Place 1 drop into both eyes at bedtime.   01/26/2021   levothyroxine (SYNTHROID) 175 MCG tablet Take 175 mcg by mouth daily before breakfast.   01/27/2021   LORazepam (ATIVAN) 0.5 MG tablet Take 0.25 mg by mouth in the morning and at bedtime.   01/27/2021   metoprolol succinate (TOPROL-XL) 100 MG 24 hr tablet Take 100 mg by mouth daily.   01/27/2021 at 0800   NON FORMULARY Take 1 Dose by mouth daily. Health shake supplement daily at 3pm      QUEtiapine (SEROQUEL) 25 MG tablet Take 12.5 mg  by mouth 2 (two) times daily.   01/27/2021   rosuvastatin (CRESTOR) 10 MG tablet Take 10 mg by mouth See admin instructions. Twice a week on Monday and Friday   01/26/2021   spironolactone (ALDACTONE) 25 MG tablet Take 25 mg by mouth daily.   01/27/2021   vitamin B-12 (CYANOCOBALAMIN) 1000 MCG tablet Take 1,000 mcg by mouth daily.   01/27/2021   warfarin (COUMADIN) 3 MG tablet Take 3 mg by mouth daily.   01/26/2021 at 1700   citalopram (CELEXA) 20 MG tablet Take 20 mg by mouth daily.      doxycycline (VIBRAMYCIN) 100 MG capsule Take 1 capsule (100 mg total) by mouth 2 (two) times daily. One po bid x 7 days (Patient not taking: Reported on 01/28/2021) 14 capsule 0 Completed Course   RIVAROXABAN (XARELTO) VTE STARTER PACK (15 & 20 MG) Follow package directions: Take one 15mg  tablet by mouth twice a day. On day 22, switch to one 20mg  tablet once a day. Take  with food. (Patient not taking: No sig reported) 51 each 0 Not Taking   warfarin (COUMADIN) 2 MG tablet Take 2 mg by mouth daily. (Patient not taking: No sig reported)   Not Taking    Assessment: 86 yof on warfarin PTA for recent DVT admitted for unwitnessed fall. She is s/p left hip trochanteric femoral nailing on 11/13. Pharmacy consulted to resume warfarin.  INR subtherapeutic at 1.2 today. No bleeding noted, Hgb down to 9.6 post-op.  PTA warfarin: 3 mg/day although INR 1.1 on admit  Goal of Therapy:  INR 2-3 Monitor platelets by anticoagulation protocol: Yes   Plan:  Warfarin 4 mg PO tonight INR daily Monitor for s/sx of bleeding  Thank you for involving pharmacy in this patient's care.  89, PharmD, BCPS Clinical Pharmacist Clinical phone for 01/29/2021 until 3p is Loura Back 01/29/2021 11:19 AM  **Pharmacist phone directory can be found on [email or web address] listed under [facility]**

## 2021-01-29 NOTE — Progress Notes (Signed)
PROGRESS NOTE                                                                                                                                                                                                             Patient Demographics:    [name], is a 87 y.o. female, DOB - 03-Jan-1930, OS:[id number]  Outpatient Primary MD for the patient is Pcp, No    LOS - 1  Admit date - 01/27/2021    Chief Complaint  Patient presents with   Fall    On thinners       Brief Narrative (HPI from H&P)  - [name] is a 87 y.o. female with history of dementia, hypertension, hypothyroidism was brought to the ER after patient had an unwitnessed fall at the living facility, in the ER she was found to have left hip fracture and admitted for further care.   Subjective:   Patient in bed mildly confused but in no distress denies any headache chest or abdominal pain.  No shortness of breath.   Assessment  & Plan :      Acute left hip fracture after a mechanical unwitnessed fall at SNF -seen by Ortho underwent operative correction on 01/28/2021, tolerated the surgery well, minimal perioperative blood loss related anemia will monitor no transfusion yet, weightbearing as tolerated per Ortho, home dose Coumadin resumed, she has history of DVT in the past as well.  Will require SNF.  2.  Advanced underlying dementia.  At risk for delirium.  As needed Haldol, minimize narcotics and benzodiazepines.  Continue home dose Seroquel.  3.  AKI on CKD 3B. Creatinine baseline is 1.5, hydrate and monitor.  Skip blood pressure medications on 01/29/2021 per  4. HTN.  BP soft on 01/29/2021, blood pressure medications held and adjusted for tomorrow.  5.  History of left bundle branch block.  Stable.  6.  Hypothyroidism.  On Synthroid.   7.  Chronic macrocytic anemia.  Type screen and monitor.  8.  Dyslipidemia.  On statin.  9.  HX of  DVT October 2022.  Has been started on Coumadin.      Condition - Extremely Guarded  Family Communication  :  Almyra Free [phone] - 01/29/20  Code Status : DNR  Consults  : Orthopedics  PUD Prophylaxis :   Procedures  :  Disposition Plan  :    Status is: Inpatient  Remains inpatient appropriate because: Acute left hip fracture.  DVT Prophylaxis  :  Per Ortho resume Coumadin.    Lab Results  Component Value Date   PLT 212 01/29/2021    Diet :  Diet Order             DIET SOFT Room service appropriate? Yes; Fluid consistency: Thin; Fluid restriction: 1800 mL Fluid  Diet effective now                    Inpatient Medications  Scheduled Meds:  acetaminophen  500 mg Oral Q6H   amLODipine  10 mg Oral Daily   chlorhexidine  15 mL Mouth/Throat Once   docusate sodium  100 mg Oral BID   latanoprost  1 drop Both Eyes QHS   levothyroxine  175 mcg Oral QAC breakfast   [START ON 01/30/2021] metoprolol succinate  50 mg Oral Daily   pantoprazole  40 mg Oral Daily   QUEtiapine  12.5 mg Oral BID   rosuvastatin  10 mg Oral Once per day on Mon Fri   sodium zirconium cyclosilicate  10 g Oral BID   traMADol  50 mg Oral Q6H   vitamin B-12  1,000 mcg Oral Daily   Continuous Infusions:  lactated ringers 125 mL/hr at 01/29/21 1022   vancomycin     PRN Meds:.acetaminophen, alum & mag hydroxide-simeth, bisacodyl, haloperidol lactate, hydrALAZINE, HYDROcodone-acetaminophen, morphine injection, ondansetron **OR** ondansetron (ZOFRAN) IV, polyethylene glycol  Time Spent in minutes  30   Susa Raring M.D on 01/29/2021 at 10:58 AM  To page go to [email or web address]   [facility] -  Office  [phone]  See all Orders from today for further details    Objective:   Vitals:   01/29/21 0028 01/29/21 0535 01/29/21 0721 01/29/21 1027  BP: (!) 104/52 (!) 105/51 (!) 105/59 (!) 126/55  Pulse: (!) 58 60 62 (!) 58  Resp: 15 16 16 18   Temp: 97.7 F (36.5  C) 98.2 F (36.8 C) 97.8 F (36.6 C)   TempSrc: Axillary Axillary Oral   SpO2: 98% 96% 97%   Weight:      Height:        Wt Readings from Last 3 Encounters:  01/27/21 70.9 kg  10/07/20 74.8 kg  12/21/15 99.3 kg     Intake/Output Summary (Last 24 hours) at 01/29/2021 1058 Last data filed at 01/28/2021 1658 Gross per 24 hour  Intake 800 ml  Output 50 ml  Net 750 ml     Physical Exam  Awake Alert x 1, No new F.N deficits,   Esmeralda.AT,PERRAL Supple Neck, No JVD,   Symmetrical Chest wall movement, Good air movement bilaterally, CTAB RRR,No Gallops,Rubs or new Murmurs,  +ve B.Sounds, Abd Soft, No tenderness,   No Cyanosis, L. Hip post op site stable      Data Review:    CBC Recent Labs  Lab 01/27/21 1745 01/29/21 0652  WBC 7.7 13.0*  HGB 11.6* 9.2*  HCT 37.9 29.8*  PLT 227 212  MCV 101.6* 101.0*  MCH 31.1 31.2  MCHC 30.6 30.9  RDW 16.2* 16.0*  LYMPHSABS  --  1.2  MONOABS  --  1.5*  EOSABS  --  0.0  BASOSABS  --  0.0    Electrolytes Recent Labs  Lab 01/27/21 1745 01/28/21 0730 01/29/21 0120  NA 139  --  136  K 4.9  --  5.6*  CL 106  --  104  CO2 25  --  23  GLUCOSE 101*  --  152*  BUN 40*  --  39*  CREATININE 1.54*  --  1.92*  CALCIUM 9.3  --  8.4*  AST 28  --  28  ALT 11  --  14  ALKPHOS 56  --  44  BILITOT 0.4  --  0.6  ALBUMIN 3.4*  --  2.9*  MG  --   --  2.0  INR 1.1 1.2 1.2  BNP  --   --  497.8*    ------------------------------------------------------------------------------------------------------------------ No results for input(s): CHOL, HDL, LDLCALC, TRIG, CHOLHDL, LDLDIRECT in the last 72 hours.  Lab Results  Component Value Date   HGBA1C 6.3 (H) 05/09/2015    No results for input(s): TSH, T4TOTAL, T3FREE, THYROIDAB in the last 72 hours.  Invalid input(s): FREET3 ------------------------------------------------------------------------------------------------------------------ ID Labs Recent Labs  Lab 01/27/21 1745  01/29/21 0120 01/29/21 0652  WBC 7.7  --  13.0*  PLT 227  --  212  CREATININE 1.54* 1.92*  --    Cardiac Enzymes No results for input(s): CKMB, TROPONINI, MYOGLOBIN in the last 168 hours.  Invalid input(s): CK   Radiology Reports CT Head Wo Contrast  Result Date: 01/28/2021 CLINICAL DATA:  Fall. EXAM: CT HEAD WITHOUT CONTRAST CT CERVICAL SPINE WITHOUT CONTRAST TECHNIQUE: Multidetector CT imaging of the head and cervical spine was performed following the standard protocol without intravenous contrast. Multiplanar CT image reconstructions of the cervical spine were also generated. COMPARISON:  CT head and cervical spine 01/27/2021. FINDINGS: CT HEAD FINDINGS Brain: No evidence of acute infarction, hemorrhage, hydrocephalus, extra-axial collection or mass lesion/mass effect. Again seen is mild diffuse atrophy. There is stable mild periventricular white matter hypodensity, likely chronic small vessel ischemic change. Vascular: Atherosclerotic calcifications are present within the cavernous internal carotid arteries. Skull: Normal. Negative for fracture or focal lesion. Sinuses/Orbits: No acute finding. Other: None. CT CERVICAL SPINE FINDINGS Alignment: There is stable grade 1 anterolisthesis at C3-C4 and C4-C5 which is favored as degenerative. Spinal alignment is otherwise within normal limits. Skull base and vertebrae: No acute fracture. No primary bone lesion or focal pathologic process. There is stable mild chronic compression deformity of T1. Soft tissues and spinal canal: No prevertebral fluid or swelling. No visible canal hematoma. Disc levels: Again seen is moderate severe disc space narrowing throughout the cervical spine with endplate osteophyte formation most significant at C5-C6. At C5-C6 there is some mild bilateral neural foraminal stenosis, unchanged. There is no significant central canal stenosis identified at any level. Upper chest: Negative. Other: None. IMPRESSION: No acute  intracranial process. No acute fracture or traumatic subluxation of the cervical spine. Electronically Signed   By: Ronney Asters M.D.   On: 01/28/2021 00:01   CT HEAD WO CONTRAST  Result Date: 01/27/2021 CLINICAL DATA:  Head trauma, minor (Age >= 65y); Neck trauma (Age >= 65y) EXAM: CT HEAD WITHOUT CONTRAST CT CERVICAL SPINE WITHOUT CONTRAST TECHNIQUE: Multidetector CT imaging of the head and cervical spine was performed following the standard protocol without intravenous contrast. Multiplanar CT image reconstructions of the cervical spine were also generated. COMPARISON:  12/29/2020 FINDINGS: CT HEAD FINDINGS Brain: Normal anatomic configuration. Parenchymal volume loss is commensurate with the patient's age. Mild periventricular white matter changes are present likely reflecting the sequela of small vessel ischemia. No abnormal intra or extra-axial mass lesion or fluid collection. No abnormal mass effect or midline shift. No evidence of acute intracranial hemorrhage or infarct. Ventricular size is normal.  Cerebellum unremarkable. Vascular: No asymmetric hyperdense vasculature at the skull base. Advanced vascular calcifications are noted within the carotid siphons and terminal vertebral arteries. Skull: Intact Sinuses/Orbits: Paranasal sinuses are clear. The ocular lenses have been removed. Orbits are otherwise unremarkable. Other: Mastoid air cells and middle ear cavities are clear. CT CERVICAL SPINE FINDINGS Alignment: There is straightening of the cervical spine, unchanged from prior examination. There is stable anterolisthesis of C3 upon C4 and C4 upon C5 of approximately 2-3 mm. Stable retrolisthesis of C5 upon C6 of 3 mm. Skull base and vertebrae: Craniocervical alignment is normal. The atlantodental interval is not widened. There are advanced degenerative changes involving the atlantodental articulation as well as the right atlantoaxial articulation. Moderate degenerative arthritis noted involving the  occipitoatlantal articulation bilaterally as well as the left atlantoaxial articulation. No acute fracture of the cervical spine. Remote superior endplate fracture of T1 with mild loss of height is unchanged. There is again noted ankylosis of the right C2-3, C4-5, and C7-T1 facets as well as the left C2-3 and C4-5 facet joints. Soft tissues and spinal canal: No prevertebral fluid or swelling. No visible canal hematoma. No significant canal stenosis. Disc levels: There is marked intervertebral disc space narrowing and endplate remodeling throughout the cervical spine, most severe at C4-C7 in keeping with changes of moderate to severe degenerative disc disease. The prevertebral soft tissues are not thickened on sagittal reformats. Review of the axial images demonstrates multilevel advanced uncovertebral and facet arthrosis resulting in mild-to-moderate left neuroforaminal narrowing at C3-4, mild right and moderate left neuroforaminal narrowing at C5-6. Upper chest: Asymmetric right apical parenchymal scarring. Moderate atherosclerotic calcification within the right carotid bifurcation. Other: None IMPRESSION: No acute intracranial injury.  No calvarial fracture. No acute fracture or listhesis of the cervical spine. Electronically Signed   By: Fidela Salisbury M.D.   On: 01/27/2021 19:51   CT Cervical Spine Wo Contrast  Result Date: 01/28/2021 CLINICAL DATA:  Fall. EXAM: CT HEAD WITHOUT CONTRAST CT CERVICAL SPINE WITHOUT CONTRAST TECHNIQUE: Multidetector CT imaging of the head and cervical spine was performed following the standard protocol without intravenous contrast. Multiplanar CT image reconstructions of the cervical spine were also generated. COMPARISON:  CT head and cervical spine 01/27/2021. FINDINGS: CT HEAD FINDINGS Brain: No evidence of acute infarction, hemorrhage, hydrocephalus, extra-axial collection or mass lesion/mass effect. Again seen is mild diffuse atrophy. There is stable mild periventricular  white matter hypodensity, likely chronic small vessel ischemic change. Vascular: Atherosclerotic calcifications are present within the cavernous internal carotid arteries. Skull: Normal. Negative for fracture or focal lesion. Sinuses/Orbits: No acute finding. Other: None. CT CERVICAL SPINE FINDINGS Alignment: There is stable grade 1 anterolisthesis at C3-C4 and C4-C5 which is favored as degenerative. Spinal alignment is otherwise within normal limits. Skull base and vertebrae: No acute fracture. No primary bone lesion or focal pathologic process. There is stable mild chronic compression deformity of T1. Soft tissues and spinal canal: No prevertebral fluid or swelling. No visible canal hematoma. Disc levels: Again seen is moderate severe disc space narrowing throughout the cervical spine with endplate osteophyte formation most significant at C5-C6. At C5-C6 there is some mild bilateral neural foraminal stenosis, unchanged. There is no significant central canal stenosis identified at any level. Upper chest: Negative. Other: None. IMPRESSION: No acute intracranial process. No acute fracture or traumatic subluxation of the cervical spine. Electronically Signed   By: Ronney Asters M.D.   On: 01/28/2021 00:01   CT CERVICAL SPINE WO CONTRAST  Result Date: 01/27/2021  CLINICAL DATA:  Head trauma, minor (Age >= 65y); Neck trauma (Age >= 65y) EXAM: CT HEAD WITHOUT CONTRAST CT CERVICAL SPINE WITHOUT CONTRAST TECHNIQUE: Multidetector CT imaging of the head and cervical spine was performed following the standard protocol without intravenous contrast. Multiplanar CT image reconstructions of the cervical spine were also generated. COMPARISON:  12/29/2020 FINDINGS: CT HEAD FINDINGS Brain: Normal anatomic configuration. Parenchymal volume loss is commensurate with the patient's age. Mild periventricular white matter changes are present likely reflecting the sequela of small vessel ischemia. No abnormal intra or extra-axial mass  lesion or fluid collection. No abnormal mass effect or midline shift. No evidence of acute intracranial hemorrhage or infarct. Ventricular size is normal. Cerebellum unremarkable. Vascular: No asymmetric hyperdense vasculature at the skull base. Advanced vascular calcifications are noted within the carotid siphons and terminal vertebral arteries. Skull: Intact Sinuses/Orbits: Paranasal sinuses are clear. The ocular lenses have been removed. Orbits are otherwise unremarkable. Other: Mastoid air cells and middle ear cavities are clear. CT CERVICAL SPINE FINDINGS Alignment: There is straightening of the cervical spine, unchanged from prior examination. There is stable anterolisthesis of C3 upon C4 and C4 upon C5 of approximately 2-3 mm. Stable retrolisthesis of C5 upon C6 of 3 mm. Skull base and vertebrae: Craniocervical alignment is normal. The atlantodental interval is not widened. There are advanced degenerative changes involving the atlantodental articulation as well as the right atlantoaxial articulation. Moderate degenerative arthritis noted involving the occipitoatlantal articulation bilaterally as well as the left atlantoaxial articulation. No acute fracture of the cervical spine. Remote superior endplate fracture of T1 with mild loss of height is unchanged. There is again noted ankylosis of the right C2-3, C4-5, and C7-T1 facets as well as the left C2-3 and C4-5 facet joints. Soft tissues and spinal canal: No prevertebral fluid or swelling. No visible canal hematoma. No significant canal stenosis. Disc levels: There is marked intervertebral disc space narrowing and endplate remodeling throughout the cervical spine, most severe at C4-C7 in keeping with changes of moderate to severe degenerative disc disease. The prevertebral soft tissues are not thickened on sagittal reformats. Review of the axial images demonstrates multilevel advanced uncovertebral and facet arthrosis resulting in mild-to-moderate left  neuroforaminal narrowing at C3-4, mild right and moderate left neuroforaminal narrowing at C5-6. Upper chest: Asymmetric right apical parenchymal scarring. Moderate atherosclerotic calcification within the right carotid bifurcation. Other: None IMPRESSION: No acute intracranial injury.  No calvarial fracture. No acute fracture or listhesis of the cervical spine. Electronically Signed   By: Fidela Salisbury M.D.   On: 01/27/2021 19:51   Pelvis Portable  Result Date: 01/28/2021 CLINICAL DATA:  Status post ORIF of the LEFT femur EXAM: PORTABLE PELVIS 1 VIEWS COMPARISON:  January 28, 2021 FINDINGS: Status post ORIF of the LEFT femur. Orthopedic hardware is intact and without periprosthetic fracture or lucency. Soft tissue air. Persistent distraction of the lesser trochanter. Fracture fragments are in improved near anatomic alignment. Osteopenia. Pelvic phleboliths. IMPRESSION: Expected postsurgical appearance status post ORIF of the LEFT femur. Electronically Signed   By: Valentino Saxon M.D.   On: 01/28/2021 16:47   CT T-SPINE NO CHARGE  Result Date: 01/28/2021 CLINICAL DATA:  Fall EXAM: CT Thoracic and Lumbar spine without contrast TECHNIQUE: Multiplanar CT images of the thoracic and lumbar spine were reconstructed from contemporary CT of the Chest, Abdomen, and Pelvis CONTRAST:  None COMPARISON:  None. FINDINGS: CT THORACIC SPINE FINDINGS Alignment: Normal thoracic kyphosis. Vertebrae: No acute fracture or focal pathologic process. Paraspinal and other soft tissues:  Better evaluated on dedicated CT chest. Disc levels: Mild degenerative changes of the mid/lower thoracic spine. Superior endplate Schmorl's node deformity at T11. Spinal canal is patent. CT LUMBAR SPINE FINDINGS Segmentation: 5 lumbar type vertebral bodies. Alignment: Normal lumbar lordosis. Vertebrae: No acute fracture or focal pathologic process. Paraspinal and other soft tissues: Better evaluated on dedicated CT abdomen/pelvis. Disc levels:  Mild multilevel degenerative changes, most prominent at L3-4. Spinal canal is patent. IMPRESSION: No evidence of traumatic injury to the thoracolumbar spine. Mild multilevel degenerative changes, as above. Electronically Signed   By: Julian Hy M.D.   On: 01/28/2021 00:14   CT L-SPINE NO CHARGE  Result Date: 01/28/2021 CLINICAL DATA:  Fall EXAM: CT Thoracic and Lumbar spine without contrast TECHNIQUE: Multiplanar CT images of the thoracic and lumbar spine were reconstructed from contemporary CT of the Chest, Abdomen, and Pelvis CONTRAST:  None COMPARISON:  None. FINDINGS: CT THORACIC SPINE FINDINGS Alignment: Normal thoracic kyphosis. Vertebrae: No acute fracture or focal pathologic process. Paraspinal and other soft tissues: Better evaluated on dedicated CT chest. Disc levels: Mild degenerative changes of the mid/lower thoracic spine. Superior endplate Schmorl's node deformity at T11. Spinal canal is patent. CT LUMBAR SPINE FINDINGS Segmentation: 5 lumbar type vertebral bodies. Alignment: Normal lumbar lordosis. Vertebrae: No acute fracture or focal pathologic process. Paraspinal and other soft tissues: Better evaluated on dedicated CT abdomen/pelvis. Disc levels: Mild multilevel degenerative changes, most prominent at L3-4. Spinal canal is patent. IMPRESSION: No evidence of traumatic injury to the thoracolumbar spine. Mild multilevel degenerative changes, as above. Electronically Signed   By: Julian Hy M.D.   On: 01/28/2021 00:14   DG Chest Port 1 View  Result Date: 01/27/2021 CLINICAL DATA:  Post fall with left hip pain. EXAM: PORTABLE CHEST 1 VIEW COMPARISON:  12/29/2020 FINDINGS: Stable upper normal heart size. Unchanged mediastinal contours with aortic atherosclerosis. Mitral annulus calcifications. No pneumothorax, pleural effusion, or focal airspace disease. No pulmonary edema. The bones are under mineralized without acute osseous abnormalities. IMPRESSION: No acute chest findings.  Electronically Signed   By: Keith Rake M.D.   On: 01/27/2021 18:28   DG Knee Left Port  Result Date: 01/27/2021 CLINICAL DATA:  Fall. EXAM: LEFT FEMUR PORTABLE 1 VIEW; PORTABLE LEFT KNEE - 1-2 VIEW COMPARISON:  The left knee x-ray 12/31/2015. FINDINGS: Left knee: Left knee arthroplasty appears in anatomic alignment. There is no evidence for hardware loosening or acute fracture. There is no significant joint effusion. Vascular calcifications are seen in the soft tissues. Left femur: No acute fracture or dislocation identified. There are vascular calcifications in the soft tissues. IMPRESSION: 1. No acute fracture or dislocation of the left femur or left knee. 2. Left knee arthroplasty in anatomic alignment. Electronically Signed   By: Ronney Asters M.D.   On: 01/27/2021 21:19   DG C-Arm 1-60 Min-No Report  Result Date: 01/28/2021 CLINICAL DATA:  Left hip ORIF EXAM: OPERATIVE LEFT HIP (WITH PELVIS IF PERFORMED) 4 VIEWS TECHNIQUE: Fluoroscopic spot image(s) were submitted for interpretation post-operatively. COMPARISON:  Left hip radiographs-01/27/2021; CT abdomen pelvis-01/27/2021 FINDINGS: Four spot intraoperative fluoroscopic images of the left hip are provided for review. Provided images demonstrate the sequela of intramedullary rod fixation of the proximal femur and dynamic screw fixation of the femoral neck. The distal end of the femoral rod is transfixed with a single cancellous screw. Alignment of the intertrochanteric femur fracture appears near anatomic though there is persistent displacement of the lesser trochanter. Minimal amount of adjacent subcutaneous emphysema. No radiopaque  foreign body. IMPRESSION: Post uncomplicated ORIF left intertrochanteric femur fracture. Electronically Signed   By: Sandi Mariscal M.D.   On: 01/28/2021 15:30   DG HIP OPERATIVE UNILAT WITH PELVIS LEFT  Result Date: 01/28/2021 CLINICAL DATA:  Left hip ORIF EXAM: OPERATIVE LEFT HIP (WITH PELVIS IF PERFORMED) 4  VIEWS TECHNIQUE: Fluoroscopic spot image(s) were submitted for interpretation post-operatively. COMPARISON:  Left hip radiographs-01/27/2021; CT abdomen pelvis-01/27/2021 FINDINGS: Four spot intraoperative fluoroscopic images of the left hip are provided for review. Provided images demonstrate the sequela of intramedullary rod fixation of the proximal femur and dynamic screw fixation of the femoral neck. The distal end of the femoral rod is transfixed with a single cancellous screw. Alignment of the intertrochanteric femur fracture appears near anatomic though there is persistent displacement of the lesser trochanter. Minimal amount of adjacent subcutaneous emphysema. No radiopaque foreign body. IMPRESSION: Post uncomplicated ORIF left intertrochanteric femur fracture. Electronically Signed   By: Sandi Mariscal M.D.   On: 01/28/2021 15:30   DG Hip Unilat W or Wo Pelvis 2-3 Views Left  Result Date: 01/27/2021 CLINICAL DATA:  Post fall with left hip pain. EXAM: DG HIP (WITH OR WITHOUT PELVIS) 2-3V LEFT COMPARISON:  Pelvis radiograph 12/29/2020 FINDINGS: Bones are diffusely under mineralized. No visualized fracture of the pelvis or left hip. Femoral head is seated. Mild left hip osteoarthritis with joint space narrowing and spurring. No visualized pubic rami fracture. Pubic symphysis and sacroiliac joints are congruent. IMPRESSION: No fracture of the pelvis or left hip. Electronically Signed   By: Keith Rake M.D.   On: 01/27/2021 18:30   DG Femur Portable 1 View Left  Result Date: 01/27/2021 CLINICAL DATA:  Fall. EXAM: LEFT FEMUR PORTABLE 1 VIEW; PORTABLE LEFT KNEE - 1-2 VIEW COMPARISON:  The left knee x-ray 12/31/2015. FINDINGS: Left knee: Left knee arthroplasty appears in anatomic alignment. There is no evidence for hardware loosening or acute fracture. There is no significant joint effusion. Vascular calcifications are seen in the soft tissues. Left femur: No acute fracture or dislocation identified.  There are vascular calcifications in the soft tissues. IMPRESSION: 1. No acute fracture or dislocation of the left femur or left knee. 2. Left knee arthroplasty in anatomic alignment. Electronically Signed   By: Ronney Asters M.D.   On: 01/27/2021 21:19   CT CHEST ABDOMEN PELVIS WO CONTRAST  Result Date: 01/28/2021 CLINICAL DATA:  Fall, left hip pain EXAM: CT CHEST, ABDOMEN AND PELVIS WITHOUT CONTRAST TECHNIQUE: Multidetector CT imaging of the chest, abdomen and pelvis was performed following the standard protocol without IV contrast. COMPARISON:  None. FINDINGS: CT CHEST FINDINGS Cardiovascular: The heart is normal in size. No pericardial effusion. No evidence of thoracic aortic aneurysm. Atherosclerotic calcifications of the arch. Mitral valve annular calcifications. Mild coronary atherosclerosis of the LAD. Mediastinum/Nodes: No suspicious mediastinal lymphadenopathy. Visualized thyroid is unremarkable. Lungs/Pleura: Biapical pleural-parenchymal scarring. No suspicious pulmonary nodules. Very mild centrilobular emphysematous changes in the upper lobes. No focal consolidation or aspiration. No pleural effusion or pneumothorax. Musculoskeletal: No fracture is seen. Sternum, clavicles, scapulae, and bilateral ribs are intact. Dedicated thoracic spine has been performed and will be reported separately. CT ABDOMEN PELVIS FINDINGS Hepatobiliary: Scattered hepatic cysts measuring up to 2.9 cm, benign. No perihepatic fluid/hemorrhage. Status post cholecystectomy. No intrahepatic or extrahepatic duct dilatation. Pancreas: Within normal limits. Spleen: Within normal limits.  No perisplenic fluid/hemorrhage. Adrenals/Urinary Tract: Adrenal glands are within normal limits. Bilateral renal cysts, measuring up to 5.3 cm in the anterior left upper kidney, benign. No  hydronephrosis. Bladder is within normal limits. Stomach/Bowel: Stomach is notable for a small hiatal hernia. No evidence of bowel obstruction. Appendix is not  discretely visualized. Sigmoid diverticulosis, without evidence of diverticulitis. Vascular/Lymphatic: No evidence of abdominal aortic aneurysm. Atherosclerotic calcifications of the abdominal aorta and branch vessels. No suspicious abdominopelvic lymphadenopathy. Reproductive: Status post hysterectomy. Right ovary is within normal limits. 4.2 cm simple left ovarian cyst (series 3/image 99). No follow-up imaging recommended. Note: This recommendation does not apply to premenarchal patients and to those with increased risk (genetic, family history, elevated tumor markers or other high-risk factors) of ovarian cancer. Reference: JACR 2020 Feb; 17(2):248-254 Other: No abdominopelvic ascites. No hemoperitoneum or free air. Musculoskeletal: Comminuted intertrochanteric left hip fracture with mildly displaced lesser trochanter fragment and mild foreshortening. Overlying minimal subcutaneous bruising/hemorrhage (series 3/image 124). Visualized bony pelvis and right femur are intact. Mild degenerative changes of the bilateral hips. Left gluteal intramuscular lipoma (series 3/image 91), benign. Dedicated lumbar spine has been performed and will be reported separately. IMPRESSION: Comminuted, displaced left hip fracture, as above. Otherwise, no evidence of traumatic injury to the chest/abdomen. Dedicated thoracolumbar spine has been performed and will be reported separately. Electronically Signed   By: Julian Hy M.D.   On: 01/28/2021 00:12

## 2021-01-29 NOTE — Progress Notes (Addendum)
Subjective: 1 Day Post-Op s/p Procedure(s): INTRAMEDULLARY (IM) NAIL INTERTROCHANTRIC   Patient is alert, sitting up in bed, pleasantly confused. States she is in no pain this morning, is confused as to why I asked. Denies chest pain, SOB, Calf pain. No nausea/vomiting. No other complaints.   Objective:  PE: VITALS:   Vitals:   01/28/21 2126 01/29/21 0028 01/29/21 0535 01/29/21 0721  BP: 115/67 (!) 104/52 (!) 105/51 (!) 105/59  Pulse: 62 (!) 58 60 62  Resp: 15 15 16 16   Temp: 98.5 F (36.9 C) 97.7 F (36.5 C) 98.2 F (36.8 C) 97.8 F (36.6 C)  TempSrc: Axillary Axillary Axillary Oral  SpO2: 98% 98% 96% 97%  Weight:      Height:        ABD soft Sensation intact distally Intact pulses distally Dorsiflexion/Plantar flexion intact Incision: dressing C/D/I and scant drainage  LABS  Results for orders placed or performed during the hospital encounter of 01/27/21 (from the past 24 hour(s))  Type and screen [place]     Status: None   Collection Time: 01/28/21  7:53 AM  Result Value Ref Range   ABO/RH(D) A POS    Antibody Screen NEG    Sample Expiration      01/31/2021,2359 Performed at [facility], [address] [place], [place] [address]   Surgical pcr screen     Status: Abnormal   Collection Time: 01/28/21 12:34 PM   Specimen: Nasal Mucosa; Nasal Swab  Result Value Ref Range   MRSA, PCR POSITIVE (A) NEGATIVE   Staphylococcus aureus POSITIVE (A) NEGATIVE  No blood products     Status: None   Collection Time: 01/28/21 12:55 PM  Result Value Ref Range   Transfuse no blood products      TRANSFUSE NO BLOOD PRODUCTS, VERIFIED BY HALEY BOWMAN RN AND Pecolia Ades RN Performed at [facility], [place] [address], [place], [place] [address]   Magnesium     Status: None   Collection Time: 01/29/21  1:20 AM  Result Value Ref Range   Magnesium 2.0 1.7 - 2.4 mg/dL  Comprehensive metabolic panel     Status: Abnormal   Collection Time:  01/29/21  1:20 AM  Result Value Ref Range   Sodium 136 135 - 145 mmol/L   Potassium 5.6 (H) 3.5 - 5.1 mmol/L   Chloride 104 98 - 111 mmol/L   CO2 23 22 - 32 mmol/L   Glucose, Bld 152 (H) 70 - 99 mg/dL   BUN 39 (H) 8 - 23 mg/dL   Creatinine, Ser 1.92 (H) 0.44 - 1.00 mg/dL   Calcium 8.4 (L) 8.9 - 10.3 mg/dL   Total Protein 5.9 (L) 6.5 - 8.1 g/dL   Albumin 2.9 (L) 3.5 - 5.0 g/dL   AST 28 15 - 41 U/L   ALT 14 0 - 44 U/L   Alkaline Phosphatase 44 38 - 126 U/L   Total Bilirubin 0.6 0.3 - 1.2 mg/dL   GFR, Estimated 25 (L) >60 mL/min   Anion gap 9 5 - 15  Brain natriuretic peptide     Status: Abnormal   Collection Time: 01/29/21  1:20 AM  Result Value Ref Range   B Natriuretic Peptide 497.8 (H) 0.0 - 100.0 pg/mL  Protime-INR     Status: None   Collection Time: 01/29/21  1:20 AM  Result Value Ref Range   Prothrombin Time 14.7 11.4 - 15.2 seconds   INR 1.2  0.8 - 1.2    CT Head Wo Contrast  Result Date: 01/28/2021 CLINICAL DATA:  Fall. EXAM: CT HEAD WITHOUT CONTRAST CT CERVICAL SPINE WITHOUT CONTRAST TECHNIQUE: Multidetector CT imaging of the head and cervical spine was performed following the standard protocol without intravenous contrast. Multiplanar CT image reconstructions of the cervical spine were also generated. COMPARISON:  CT head and cervical spine 01/27/2021. FINDINGS: CT HEAD FINDINGS Brain: No evidence of acute infarction, hemorrhage, hydrocephalus, extra-axial collection or mass lesion/mass effect. Again seen is mild diffuse atrophy. There is stable mild periventricular white matter hypodensity, likely chronic small vessel ischemic change. Vascular: Atherosclerotic calcifications are present within the cavernous internal carotid arteries. Skull: Normal. Negative for fracture or focal lesion. Sinuses/Orbits: No acute finding. Other: None. CT CERVICAL SPINE FINDINGS Alignment: There is stable grade 1 anterolisthesis at C3-C4 and C4-C5 which is favored as degenerative. Spinal  alignment is otherwise within normal limits. Skull base and vertebrae: No acute fracture. No primary bone lesion or focal pathologic process. There is stable mild chronic compression deformity of T1. Soft tissues and spinal canal: No prevertebral fluid or swelling. No visible canal hematoma. Disc levels: Again seen is moderate severe disc space narrowing throughout the cervical spine with endplate osteophyte formation most significant at C5-C6. At C5-C6 there is some mild bilateral neural foraminal stenosis, unchanged. There is no significant central canal stenosis identified at any level. Upper chest: Negative. Other: None. IMPRESSION: No acute intracranial process. No acute fracture or traumatic subluxation of the cervical spine. Electronically Signed   By: Ronney Asters M.D.   On: 01/28/2021 00:01   CT HEAD WO CONTRAST  Result Date: 01/27/2021 CLINICAL DATA:  Head trauma, minor (Age >= 65y); Neck trauma (Age >= 65y) EXAM: CT HEAD WITHOUT CONTRAST CT CERVICAL SPINE WITHOUT CONTRAST TECHNIQUE: Multidetector CT imaging of the head and cervical spine was performed following the standard protocol without intravenous contrast. Multiplanar CT image reconstructions of the cervical spine were also generated. COMPARISON:  12/29/2020 FINDINGS: CT HEAD FINDINGS Brain: Normal anatomic configuration. Parenchymal volume loss is commensurate with the patient's age. Mild periventricular white matter changes are present likely reflecting the sequela of small vessel ischemia. No abnormal intra or extra-axial mass lesion or fluid collection. No abnormal mass effect or midline shift. No evidence of acute intracranial hemorrhage or infarct. Ventricular size is normal. Cerebellum unremarkable. Vascular: No asymmetric hyperdense vasculature at the skull base. Advanced vascular calcifications are noted within the carotid siphons and terminal vertebral arteries. Skull: Intact Sinuses/Orbits: Paranasal sinuses are clear. The ocular  lenses have been removed. Orbits are otherwise unremarkable. Other: Mastoid air cells and middle ear cavities are clear. CT CERVICAL SPINE FINDINGS Alignment: There is straightening of the cervical spine, unchanged from prior examination. There is stable anterolisthesis of C3 upon C4 and C4 upon C5 of approximately 2-3 mm. Stable retrolisthesis of C5 upon C6 of 3 mm. Skull base and vertebrae: Craniocervical alignment is normal. The atlantodental interval is not widened. There are advanced degenerative changes involving the atlantodental articulation as well as the right atlantoaxial articulation. Moderate degenerative arthritis noted involving the occipitoatlantal articulation bilaterally as well as the left atlantoaxial articulation. No acute fracture of the cervical spine. Remote superior endplate fracture of T1 with mild loss of height is unchanged. There is again noted ankylosis of the right C2-3, C4-5, and C7-T1 facets as well as the left C2-3 and C4-5 facet joints. Soft tissues and spinal canal: No prevertebral fluid or swelling. No visible canal hematoma. No significant canal  stenosis. Disc levels: There is marked intervertebral disc space narrowing and endplate remodeling throughout the cervical spine, most severe at C4-C7 in keeping with changes of moderate to severe degenerative disc disease. The prevertebral soft tissues are not thickened on sagittal reformats. Review of the axial images demonstrates multilevel advanced uncovertebral and facet arthrosis resulting in mild-to-moderate left neuroforaminal narrowing at C3-4, mild right and moderate left neuroforaminal narrowing at C5-6. Upper chest: Asymmetric right apical parenchymal scarring. Moderate atherosclerotic calcification within the right carotid bifurcation. Other: None IMPRESSION: No acute intracranial injury.  No calvarial fracture. No acute fracture or listhesis of the cervical spine. Electronically Signed   By: Fidela Salisbury M.D.   On:  01/27/2021 19:51   CT Cervical Spine Wo Contrast  Result Date: 01/28/2021 CLINICAL DATA:  Fall. EXAM: CT HEAD WITHOUT CONTRAST CT CERVICAL SPINE WITHOUT CONTRAST TECHNIQUE: Multidetector CT imaging of the head and cervical spine was performed following the standard protocol without intravenous contrast. Multiplanar CT image reconstructions of the cervical spine were also generated. COMPARISON:  CT head and cervical spine 01/27/2021. FINDINGS: CT HEAD FINDINGS Brain: No evidence of acute infarction, hemorrhage, hydrocephalus, extra-axial collection or mass lesion/mass effect. Again seen is mild diffuse atrophy. There is stable mild periventricular white matter hypodensity, likely chronic small vessel ischemic change. Vascular: Atherosclerotic calcifications are present within the cavernous internal carotid arteries. Skull: Normal. Negative for fracture or focal lesion. Sinuses/Orbits: No acute finding. Other: None. CT CERVICAL SPINE FINDINGS Alignment: There is stable grade 1 anterolisthesis at C3-C4 and C4-C5 which is favored as degenerative. Spinal alignment is otherwise within normal limits. Skull base and vertebrae: No acute fracture. No primary bone lesion or focal pathologic process. There is stable mild chronic compression deformity of T1. Soft tissues and spinal canal: No prevertebral fluid or swelling. No visible canal hematoma. Disc levels: Again seen is moderate severe disc space narrowing throughout the cervical spine with endplate osteophyte formation most significant at C5-C6. At C5-C6 there is some mild bilateral neural foraminal stenosis, unchanged. There is no significant central canal stenosis identified at any level. Upper chest: Negative. Other: None. IMPRESSION: No acute intracranial process. No acute fracture or traumatic subluxation of the cervical spine. Electronically Signed   By: Ronney Asters M.D.   On: 01/28/2021 00:01   CT CERVICAL SPINE WO CONTRAST  Result Date:  01/27/2021 CLINICAL DATA:  Head trauma, minor (Age >= 65y); Neck trauma (Age >= 65y) EXAM: CT HEAD WITHOUT CONTRAST CT CERVICAL SPINE WITHOUT CONTRAST TECHNIQUE: Multidetector CT imaging of the head and cervical spine was performed following the standard protocol without intravenous contrast. Multiplanar CT image reconstructions of the cervical spine were also generated. COMPARISON:  12/29/2020 FINDINGS: CT HEAD FINDINGS Brain: Normal anatomic configuration. Parenchymal volume loss is commensurate with the patient's age. Mild periventricular white matter changes are present likely reflecting the sequela of small vessel ischemia. No abnormal intra or extra-axial mass lesion or fluid collection. No abnormal mass effect or midline shift. No evidence of acute intracranial hemorrhage or infarct. Ventricular size is normal. Cerebellum unremarkable. Vascular: No asymmetric hyperdense vasculature at the skull base. Advanced vascular calcifications are noted within the carotid siphons and terminal vertebral arteries. Skull: Intact Sinuses/Orbits: Paranasal sinuses are clear. The ocular lenses have been removed. Orbits are otherwise unremarkable. Other: Mastoid air cells and middle ear cavities are clear. CT CERVICAL SPINE FINDINGS Alignment: There is straightening of the cervical spine, unchanged from prior examination. There is stable anterolisthesis of C3 upon C4 and C4 upon C5 of  approximately 2-3 mm. Stable retrolisthesis of C5 upon C6 of 3 mm. Skull base and vertebrae: Craniocervical alignment is normal. The atlantodental interval is not widened. There are advanced degenerative changes involving the atlantodental articulation as well as the right atlantoaxial articulation. Moderate degenerative arthritis noted involving the occipitoatlantal articulation bilaterally as well as the left atlantoaxial articulation. No acute fracture of the cervical spine. Remote superior endplate fracture of T1 with mild loss of height is  unchanged. There is again noted ankylosis of the right C2-3, C4-5, and C7-T1 facets as well as the left C2-3 and C4-5 facet joints. Soft tissues and spinal canal: No prevertebral fluid or swelling. No visible canal hematoma. No significant canal stenosis. Disc levels: There is marked intervertebral disc space narrowing and endplate remodeling throughout the cervical spine, most severe at C4-C7 in keeping with changes of moderate to severe degenerative disc disease. The prevertebral soft tissues are not thickened on sagittal reformats. Review of the axial images demonstrates multilevel advanced uncovertebral and facet arthrosis resulting in mild-to-moderate left neuroforaminal narrowing at C3-4, mild right and moderate left neuroforaminal narrowing at C5-6. Upper chest: Asymmetric right apical parenchymal scarring. Moderate atherosclerotic calcification within the right carotid bifurcation. Other: None IMPRESSION: No acute intracranial injury.  No calvarial fracture. No acute fracture or listhesis of the cervical spine. Electronically Signed   By: Fidela Salisbury M.D.   On: 01/27/2021 19:51   Pelvis Portable  Result Date: 01/28/2021 CLINICAL DATA:  Status post ORIF of the LEFT femur EXAM: PORTABLE PELVIS 1 VIEWS COMPARISON:  January 28, 2021 FINDINGS: Status post ORIF of the LEFT femur. Orthopedic hardware is intact and without periprosthetic fracture or lucency. Soft tissue air. Persistent distraction of the lesser trochanter. Fracture fragments are in improved near anatomic alignment. Osteopenia. Pelvic phleboliths. IMPRESSION: Expected postsurgical appearance status post ORIF of the LEFT femur. Electronically Signed   By: Valentino Saxon M.D.   On: 01/28/2021 16:47   CT T-SPINE NO CHARGE  Result Date: 01/28/2021 CLINICAL DATA:  Fall EXAM: CT Thoracic and Lumbar spine without contrast TECHNIQUE: Multiplanar CT images of the thoracic and lumbar spine were reconstructed from contemporary CT of the Chest,  Abdomen, and Pelvis CONTRAST:  None COMPARISON:  None. FINDINGS: CT THORACIC SPINE FINDINGS Alignment: Normal thoracic kyphosis. Vertebrae: No acute fracture or focal pathologic process. Paraspinal and other soft tissues: Better evaluated on dedicated CT chest. Disc levels: Mild degenerative changes of the mid/lower thoracic spine. Superior endplate Schmorl's node deformity at T11. Spinal canal is patent. CT LUMBAR SPINE FINDINGS Segmentation: 5 lumbar type vertebral bodies. Alignment: Normal lumbar lordosis. Vertebrae: No acute fracture or focal pathologic process. Paraspinal and other soft tissues: Better evaluated on dedicated CT abdomen/pelvis. Disc levels: Mild multilevel degenerative changes, most prominent at L3-4. Spinal canal is patent. IMPRESSION: No evidence of traumatic injury to the thoracolumbar spine. Mild multilevel degenerative changes, as above. Electronically Signed   By: Julian Hy M.D.   On: 01/28/2021 00:14   CT L-SPINE NO CHARGE  Result Date: 01/28/2021 CLINICAL DATA:  Fall EXAM: CT Thoracic and Lumbar spine without contrast TECHNIQUE: Multiplanar CT images of the thoracic and lumbar spine were reconstructed from contemporary CT of the Chest, Abdomen, and Pelvis CONTRAST:  None COMPARISON:  None. FINDINGS: CT THORACIC SPINE FINDINGS Alignment: Normal thoracic kyphosis. Vertebrae: No acute fracture or focal pathologic process. Paraspinal and other soft tissues: Better evaluated on dedicated CT chest. Disc levels: Mild degenerative changes of the mid/lower thoracic spine. Superior endplate Schmorl's node deformity at T11.  Spinal canal is patent. CT LUMBAR SPINE FINDINGS Segmentation: 5 lumbar type vertebral bodies. Alignment: Normal lumbar lordosis. Vertebrae: No acute fracture or focal pathologic process. Paraspinal and other soft tissues: Better evaluated on dedicated CT abdomen/pelvis. Disc levels: Mild multilevel degenerative changes, most prominent at L3-4. Spinal canal is  patent. IMPRESSION: No evidence of traumatic injury to the thoracolumbar spine. Mild multilevel degenerative changes, as above. Electronically Signed   By: Charline Bills M.D.   On: 01/28/2021 00:14   DG Chest Port 1 View  Result Date: 01/27/2021 CLINICAL DATA:  Post fall with left hip pain. EXAM: PORTABLE CHEST 1 VIEW COMPARISON:  12/29/2020 FINDINGS: Stable upper normal heart size. Unchanged mediastinal contours with aortic atherosclerosis. Mitral annulus calcifications. No pneumothorax, pleural effusion, or focal airspace disease. No pulmonary edema. The bones are under mineralized without acute osseous abnormalities. IMPRESSION: No acute chest findings. Electronically Signed   By: Narda Rutherford M.D.   On: 01/27/2021 18:28   DG Knee Left Port  Result Date: 01/27/2021 CLINICAL DATA:  Fall. EXAM: LEFT FEMUR PORTABLE 1 VIEW; PORTABLE LEFT KNEE - 1-2 VIEW COMPARISON:  The left knee x-ray 12/31/2015. FINDINGS: Left knee: Left knee arthroplasty appears in anatomic alignment. There is no evidence for hardware loosening or acute fracture. There is no significant joint effusion. Vascular calcifications are seen in the soft tissues. Left femur: No acute fracture or dislocation identified. There are vascular calcifications in the soft tissues. IMPRESSION: 1. No acute fracture or dislocation of the left femur or left knee. 2. Left knee arthroplasty in anatomic alignment. Electronically Signed   By: Darliss Cheney M.D.   On: 01/27/2021 21:19   DG C-Arm 1-60 Min-No Report  Result Date: 01/28/2021 Fluoroscopy was utilized by the requesting physician.  No radiographic interpretation.   DG HIP OPERATIVE UNILAT WITH PELVIS LEFT  Result Date: 01/28/2021 CLINICAL DATA:  Left hip ORIF EXAM: OPERATIVE LEFT HIP (WITH PELVIS IF PERFORMED) 4 VIEWS TECHNIQUE: Fluoroscopic spot image(s) were submitted for interpretation post-operatively. COMPARISON:  Left hip radiographs-01/27/2021; CT abdomen pelvis-01/27/2021  FINDINGS: Four spot intraoperative fluoroscopic images of the left hip are provided for review. Provided images demonstrate the sequela of intramedullary rod fixation of the proximal femur and dynamic screw fixation of the femoral neck. The distal end of the femoral rod is transfixed with a single cancellous screw. Alignment of the intertrochanteric femur fracture appears near anatomic though there is persistent displacement of the lesser trochanter. Minimal amount of adjacent subcutaneous emphysema. No radiopaque foreign body. IMPRESSION: Post uncomplicated ORIF left intertrochanteric femur fracture. Electronically Signed   By: Simonne Come M.D.   On: 01/28/2021 15:30   DG Hip Unilat W or Wo Pelvis 2-3 Views Left  Result Date: 01/27/2021 CLINICAL DATA:  Post fall with left hip pain. EXAM: DG HIP (WITH OR WITHOUT PELVIS) 2-3V LEFT COMPARISON:  Pelvis radiograph 12/29/2020 FINDINGS: Bones are diffusely under mineralized. No visualized fracture of the pelvis or left hip. Femoral head is seated. Mild left hip osteoarthritis with joint space narrowing and spurring. No visualized pubic rami fracture. Pubic symphysis and sacroiliac joints are congruent. IMPRESSION: No fracture of the pelvis or left hip. Electronically Signed   By: Narda Rutherford M.D.   On: 01/27/2021 18:30   DG Femur Portable 1 View Left  Result Date: 01/27/2021 CLINICAL DATA:  Fall. EXAM: LEFT FEMUR PORTABLE 1 VIEW; PORTABLE LEFT KNEE - 1-2 VIEW COMPARISON:  The left knee x-ray 12/31/2015. FINDINGS: Left knee: Left knee arthroplasty appears in anatomic alignment. There is  no evidence for hardware loosening or acute fracture. There is no significant joint effusion. Vascular calcifications are seen in the soft tissues. Left femur: No acute fracture or dislocation identified. There are vascular calcifications in the soft tissues. IMPRESSION: 1. No acute fracture or dislocation of the left femur or left knee. 2. Left knee arthroplasty in anatomic  alignment. Electronically Signed   By: Ronney Asters M.D.   On: 01/27/2021 21:19   CT CHEST ABDOMEN PELVIS WO CONTRAST  Result Date: 01/28/2021 CLINICAL DATA:  Fall, left hip pain EXAM: CT CHEST, ABDOMEN AND PELVIS WITHOUT CONTRAST TECHNIQUE: Multidetector CT imaging of the chest, abdomen and pelvis was performed following the standard protocol without IV contrast. COMPARISON:  None. FINDINGS: CT CHEST FINDINGS Cardiovascular: The heart is normal in size. No pericardial effusion. No evidence of thoracic aortic aneurysm. Atherosclerotic calcifications of the arch. Mitral valve annular calcifications. Mild coronary atherosclerosis of the LAD. Mediastinum/Nodes: No suspicious mediastinal lymphadenopathy. Visualized thyroid is unremarkable. Lungs/Pleura: Biapical pleural-parenchymal scarring. No suspicious pulmonary nodules. Very mild centrilobular emphysematous changes in the upper lobes. No focal consolidation or aspiration. No pleural effusion or pneumothorax. Musculoskeletal: No fracture is seen. Sternum, clavicles, scapulae, and bilateral ribs are intact. Dedicated thoracic spine has been performed and will be reported separately. CT ABDOMEN PELVIS FINDINGS Hepatobiliary: Scattered hepatic cysts measuring up to 2.9 cm, benign. No perihepatic fluid/hemorrhage. Status post cholecystectomy. No intrahepatic or extrahepatic duct dilatation. Pancreas: Within normal limits. Spleen: Within normal limits.  No perisplenic fluid/hemorrhage. Adrenals/Urinary Tract: Adrenal glands are within normal limits. Bilateral renal cysts, measuring up to 5.3 cm in the anterior left upper kidney, benign. No hydronephrosis. Bladder is within normal limits. Stomach/Bowel: Stomach is notable for a small hiatal hernia. No evidence of bowel obstruction. Appendix is not discretely visualized. Sigmoid diverticulosis, without evidence of diverticulitis. Vascular/Lymphatic: No evidence of abdominal aortic aneurysm. Atherosclerotic  calcifications of the abdominal aorta and branch vessels. No suspicious abdominopelvic lymphadenopathy. Reproductive: Status post hysterectomy. Right ovary is within normal limits. 4.2 cm simple left ovarian cyst (series 3/image 99). No follow-up imaging recommended. Note: This recommendation does not apply to premenarchal patients and to those with increased risk (genetic, family history, elevated tumor markers or other high-risk factors) of ovarian cancer. Reference: JACR 2020 Feb; 17(2):248-254 Other: No abdominopelvic ascites. No hemoperitoneum or free air. Musculoskeletal: Comminuted intertrochanteric left hip fracture with mildly displaced lesser trochanter fragment and mild foreshortening. Overlying minimal subcutaneous bruising/hemorrhage (series 3/image 124). Visualized bony pelvis and right femur are intact. Mild degenerative changes of the bilateral hips. Left gluteal intramuscular lipoma (series 3/image 91), benign. Dedicated lumbar spine has been performed and will be reported separately. IMPRESSION: Comminuted, displaced left hip fracture, as above. Otherwise, no evidence of traumatic injury to the chest/abdomen. Dedicated thoracolumbar spine has been performed and will be reported separately. Electronically Signed   By: Julian Hy M.D.   On: 01/28/2021 00:12    Assessment/Plan: Left intertochanteric hip fracture  1 Day Post-Op s/p Procedure(s): INTRAMEDULLARY (IM) NAIL INTERTROCHANTRIC  Weightbearing: WBAT LLE Insicional and dressing care: Reinforce dressings as needed VTE prophylaxis: Hbg 9.2 today, plan to restart warfarin today per pharmacy consult Pain control: continue current regimen Follow - up plan: 2 weeks with Dr. Mardelle Matte Dispo: pending PT eval today  Contact information:   Weekdays 8-5 Merlene Pulling, PA-C [phone] A fter hours and holidays please check [email or web address] for group call information for [facility]  Ventura Bruns 01/29/2021, 7:45 AM

## 2021-01-30 ENCOUNTER — Inpatient Hospital Stay (HOSPITAL_COMMUNITY): Payer: Medicare Other

## 2021-01-30 ENCOUNTER — Other Ambulatory Visit (HOSPITAL_COMMUNITY): Payer: Self-pay

## 2021-01-30 LAB — CBC WITH DIFFERENTIAL/PLATELET
Abs Immature Granulocytes: 0.1 10*3/uL — ABNORMAL HIGH (ref 0.00–0.07)
Basophils Absolute: 0 10*3/uL (ref 0.0–0.1)
Basophils Relative: 0 %
Eosinophils Absolute: 0.1 10*3/uL (ref 0.0–0.5)
Eosinophils Relative: 1 %
HCT: 28.5 % — ABNORMAL LOW (ref 36.0–46.0)
Hemoglobin: 8.9 g/dL — ABNORMAL LOW (ref 12.0–15.0)
Immature Granulocytes: 1 %
Lymphocytes Relative: 12 %
Lymphs Abs: 1.4 10*3/uL (ref 0.7–4.0)
MCH: 31.8 pg (ref 26.0–34.0)
MCHC: 31.2 g/dL (ref 30.0–36.0)
MCV: 101.8 fL — ABNORMAL HIGH (ref 80.0–100.0)
Monocytes Absolute: 1.6 10*3/uL — ABNORMAL HIGH (ref 0.1–1.0)
Monocytes Relative: 14 %
Neutro Abs: 8.5 10*3/uL — ABNORMAL HIGH (ref 1.7–7.7)
Neutrophils Relative %: 72 %
Platelets: 215 10*3/uL (ref 150–400)
RBC: 2.8 MIL/uL — ABNORMAL LOW (ref 3.87–5.11)
RDW: 16.1 % — ABNORMAL HIGH (ref 11.5–15.5)
WBC: 11.8 10*3/uL — ABNORMAL HIGH (ref 4.0–10.5)
nRBC: 0 % (ref 0.0–0.2)

## 2021-01-30 LAB — PROTIME-INR
INR: 1.2 (ref 0.8–1.2)
Prothrombin Time: 15 seconds (ref 11.4–15.2)

## 2021-01-30 LAB — COMPREHENSIVE METABOLIC PANEL
ALT: 8 U/L (ref 0–44)
AST: 26 U/L (ref 15–41)
Albumin: 3 g/dL — ABNORMAL LOW (ref 3.5–5.0)
Alkaline Phosphatase: 49 U/L (ref 38–126)
Anion gap: 12 (ref 5–15)
BUN: 52 mg/dL — ABNORMAL HIGH (ref 8–23)
CO2: 20 mmol/L — ABNORMAL LOW (ref 22–32)
Calcium: 8.2 mg/dL — ABNORMAL LOW (ref 8.9–10.3)
Chloride: 102 mmol/L (ref 98–111)
Creatinine, Ser: 2.63 mg/dL — ABNORMAL HIGH (ref 0.44–1.00)
GFR, Estimated: 17 mL/min — ABNORMAL LOW (ref >60)
Glucose, Bld: 116 mg/dL — ABNORMAL HIGH (ref 70–99)
Potassium: 4.9 mmol/L (ref 3.5–5.1)
Sodium: 134 mmol/L — ABNORMAL LOW (ref 135–145)
Total Bilirubin: 0.6 mg/dL (ref 0.3–1.2)
Total Protein: 6 g/dL — ABNORMAL LOW (ref 6.5–8.1)

## 2021-01-30 LAB — MAGNESIUM: Magnesium: 2.1 mg/dL (ref 1.7–2.4)

## 2021-01-30 LAB — BRAIN NATRIURETIC PEPTIDE: B Natriuretic Peptide: 173.2 pg/mL — ABNORMAL HIGH (ref 0.0–100.0)

## 2021-01-30 LAB — OSMOLALITY: Osmolality: 307 mOsm/kg — ABNORMAL HIGH (ref 275–295)

## 2021-01-30 MED ORDER — TRAMADOL HCL 50 MG PO TABS: 50.0000 mg | ORAL_TABLET | Freq: Four times a day (QID) | ORAL | Status: DC | PRN

## 2021-01-30 MED ORDER — LORAZEPAM 2 MG/ML IJ SOLN
0.5000 mg | Freq: Once | INTRAMUSCULAR | Status: AC
  Administered 2021-01-30: 0.5 mg via INTRAVENOUS
  Filled 2021-01-30: qty 1

## 2021-01-30 MED ORDER — METOPROLOL SUCCINATE ER 25 MG PO TB24
25.0000 mg | ORAL_TABLET | Freq: Every day | ORAL | Status: DC
  Administered 2021-01-30: 25 mg via ORAL
  Filled 2021-01-30: qty 1

## 2021-01-30 MED ORDER — WARFARIN SODIUM 5 MG PO TABS
5.0000 mg | ORAL_TABLET | Freq: Once | ORAL | Status: AC
  Administered 2021-01-30: 5 mg via ORAL
  Filled 2021-01-30: qty 1

## 2021-01-30 MED ORDER — CHLORHEXIDINE GLUCONATE CLOTH 2 % EX PADS
6.0000 | MEDICATED_PAD | Freq: Every day | CUTANEOUS | Status: DC
  Administered 2021-01-30 – 2021-01-31 (×2): 6 via TOPICAL

## 2021-01-30 MED ORDER — HYDROCODONE-ACETAMINOPHEN 5-325 MG PO TABS: 1.0000 | ORAL_TABLET | Freq: Four times a day (QID) | ORAL | 0 refills | Status: DC | PRN

## 2021-01-30 MED ORDER — MUPIROCIN 2 % EX OINT
1.0000 "application " | TOPICAL_OINTMENT | Freq: Two times a day (BID) | CUTANEOUS | Status: DC
  Administered 2021-01-30 (×2): 1 via NASAL
  Filled 2021-01-30: qty 22

## 2021-01-30 MED ORDER — LACTATED RINGERS IV SOLN: INTRAVENOUS | Status: DC

## 2021-01-30 NOTE — Progress Notes (Signed)
Bladder scan per MD, 126 ml of urine noted in the bladder, MD made aware.

## 2021-01-30 NOTE — Progress Notes (Signed)
Physical Therapy Treatment Patient Details Name: [name] MRN: [id number] DOB: 01-26-1930 Today's Date: 01/30/2021   History of Present Illness pt is an 87 y/o female admitted 11/12 after and unwitnessed fall in which pt sustained and significantly comminuted fracture of the L hip.  Pt s/p IM nailing 11/13.  PMHx, dementia, HTN, bil TKA's    PT Comments    Continuing work on functional mobility and activity tolerance;  Pt seemed more restless today, required Total assist of 2 to sit up on EOB; more alert and interactive sitting EOB, and got to where she only needed min assist for sitting balance; constant reassurance given; ultimately, pt requesting to lay back down; 2 person assist to lay back down and rearranged bed padding, etc   Recommendations for follow up therapy are one component of a multi-disciplinary discharge planning process, led by the attending physician.  Recommendations may be updated based on patient status, additional functional criteria and insurance authorization.  Follow Up Recommendations  Skilled nursing-short term rehab (<3 hours/day)     Assistance Recommended at Discharge Frequent or constant Supervision/Assistance  Equipment Recommendations  Other (comment) (TBD)    Recommendations for Other Services       Precautions / Restrictions Precautions Precautions: Fall Restrictions LLE Weight Bearing: Weight bearing as tolerated     Mobility  Bed Mobility Overal bed mobility: Needs Assistance Bed Mobility: Supine to Sit;Sit to Supine     Supine to sit: Total assist;+2 for physical assistance Sit to supine: Total assist;+2 for physical assistance   General bed mobility comments: cues for direction and calming patient.  Use of pad to Pivot/helicopter to EOB and assist to scoot to EOB.  pt needing 2 person total assist through out.    Transfers                        Ambulation/Gait                   Stairs              Wheelchair Mobility    Modified Rankin (Stroke Patients Only)       Balance     Sitting balance-Leahy Scale: Poor Sitting balance - Comments: Initially needing mod assist for sitting balance, tehen progressed to close guard                                    Cognition Arousal/Alertness: Awake/alert Behavior During Therapy: Restless Overall Cognitive Status: History of cognitive impairments - at baseline                                          Exercises      General Comments        Pertinent Vitals/Pain Breathing: normal Negative Vocalization: occasional moan/groan, low speech, negative/disapproving quality Facial Expression: facial grimacing Body Language: tense, distressed pacing, fidgeting Consolability: distracted or reassured by voice/touch PAINAD Score: 5    Home Living                          Prior Function            PT Goals (current goals can now be found in the care plan section) Acute Rehab PT Goals Patient Stated  Goal: pt unable PT Goal Formulation: Patient unable to participate in goal setting Time For Goal Achievement: 02/12/21 Potential to Achieve Goals: Fair Progress towards PT goals: Not progressing toward goals - comment    Frequency    Min 3X/week      PT Plan Current plan remains appropriate    Co-evaluation              AM-PAC PT "6 Clicks" Mobility   Outcome Measure  Help needed turning from your back to your side while in a flat bed without using bedrails?: Total Help needed moving from lying on your back to sitting on the side of a flat bed without using bedrails?: Total Help needed moving to and from a bed to a chair (including a wheelchair)?: Total Help needed standing up from a chair using your arms (e.g., wheelchair or bedside chair)?: Total Help needed to walk in hospital room?: Total Help needed climbing 3-5 steps with a railing? : Total 6 Click Score: 6     End of Session Equipment Utilized During Treatment: Other (comment) (bed pad) Activity Tolerance: Patient limited by pain Patient left: in bed;with call bell/phone within reach;with bed alarm set (bed in semi-cahir position) Nurse Communication: Mobility status PT Visit Diagnosis: Other abnormalities of gait and mobility (R26.89);Muscle weakness (generalized) (M62.81);Difficulty in walking, not elsewhere classified (R26.2)     Time: 6256-3893 PT Time Calculation (min) (ACUTE ONLY): 15 min  Charges:  $Therapeutic Activity: 8-22 mins                     Van Clines, PT  Acute Rehabilitation Services Pager [phone] Office [phone]    Levi Aland 01/30/2021, 2:24 PM

## 2021-01-30 NOTE — Progress Notes (Signed)
PROGRESS NOTE                                                                                                                                                                                                             Patient Demographics:    [name], is a 87 y.o. female, DOB - [birth year], OS:[id number]  Outpatient Primary MD for the patient is Pcp, No    LOS - 2  Admit date - 01/27/2021    Chief Complaint  Patient presents with   Fall    On thinners       Brief Narrative (HPI from H&P)  - KALAIYA MARKUS is a 87 y.o. female with history of dementia, hypertension, hypothyroidism was brought to the ER after patient had an unwitnessed fall at the living facility, in the ER she was found to have left hip fracture and admitted for further care.   Subjective:   Patient in bed, appears comfortable, denies any headache, no fever, no chest pain or pressure, no shortness of breath , no abdominal pain. No new focal weakness.    Assessment  & Plan :      Acute left hip fracture after a mechanical unwitnessed fall at SNF -seen by Ortho underwent operative correction on 01/28/2021, tolerated the surgery well, minimal perioperative blood loss related anemia will monitor no transfusion yet, weightbearing as tolerated per Ortho, home dose Coumadin resumed, she has history of DVT in the past as well.  Will require SNF.  2.  Advanced underlying dementia.  At risk for delirium.  As needed Haldol, minimize narcotics and benzodiazepines.  Continue home dose Seroquel.  3.  AKI on CKD 3B. Creatinine baseline is 1.5, very poor oral intake and renal function is worse after gentle hydration on 01/29/2021, bladder scan reveals only 100 cc of urine, urine output has gone down as well, aggressively hydrate on 01/30/2021, urine electrolytes ordered on the 14th are still pending nursing staff reminded, will monitor closely with hydration.   Avoid nephrotoxins.  Renal ultrasound also ordered.  4. HTN.  BP soft, skip blood pressure medications today, hydrate with IV fluids and monitor.  5.  History of left bundle branch block.  Stable.  6.  Hypothyroidism.  On Synthroid.   7.  Chronic macrocytic anemia.  Type screen and monitor.  8.  Dyslipidemia.  On statin.  9.  HX of DVT October 2022.  Has been started on Coumadin.      Condition - Extremely Guarded  Family Communication  :  Jonetta Speak [phone] - 01/29/20, son and husband bedside on 01/30/2021  Code Status : DNR  Consults  : Orthopedics  PUD Prophylaxis :   Procedures  :     L hip ORIF - 01/28/21      Disposition Plan  :    Status is: Inpatient  Remains inpatient appropriate because: Acute left hip fracture.  DVT Prophylaxis  :  Per Ortho resume Coumadin.    Lab Results  Component Value Date   PLT 215 01/30/2021    Diet :  Diet Order             DIET SOFT Room service appropriate? Yes; Fluid consistency: Thin; Fluid restriction: 1800 mL Fluid  Diet effective now                    Inpatient Medications  Scheduled Meds:  chlorhexidine  15 mL Mouth/Throat Once   docusate sodium  100 mg Oral BID   feeding supplement  237 mL Oral BID BM   latanoprost  1 drop Both Eyes QHS   levothyroxine  175 mcg Oral QAC breakfast   metoprolol succinate  25 mg Oral Daily   multivitamin with minerals  1 tablet Oral Daily   pantoprazole  40 mg Oral Daily   QUEtiapine  12.5 mg Oral BID   rosuvastatin  10 mg Oral Once per day on Mon Fri   vitamin B-12  1,000 mcg Oral Daily   warfarin  5 mg Oral ONCE-1600   Warfarin - Pharmacist Dosing Inpatient   Does not apply q1600   Continuous Infusions:  lactated ringers 75 mL/hr at 01/30/21 0801   PRN Meds:.acetaminophen, alum & mag hydroxide-simeth, bisacodyl, haloperidol lactate, hydrALAZINE, HYDROcodone-acetaminophen, [DISCONTINUED] ondansetron **OR** ondansetron (ZOFRAN) IV, polyethylene glycol  Time  Spent in minutes  30   Susa Raring M.D on 01/30/2021 at 9:34 AM  To page go to [email or web address]   [facility] -  Office  [phone]  See all Orders from today for further details    Objective:   Vitals:   01/29/21 1027 01/29/21 1620 01/29/21 2005 01/30/21 0559  BP: (!) 126/55 (!) 125/49 (!) 116/50 (!) 115/55  Pulse: (!) 58 (!) 58 65 64  Resp: 18 16 16 17   Temp:  97.7 F (36.5 C) 98.4 F (36.9 C) 98.5 F (36.9 C)  TempSrc:  Oral Oral Axillary  SpO2:  99% 98% 97%  Weight:      Height:        Wt Readings from Last 3 Encounters:  01/27/21 70.9 kg  10/07/20 74.8 kg  12/21/15 99.3 kg     Intake/Output Summary (Last 24 hours) at 01/30/2021 0934 Last data filed at 01/29/2021 1700 Gross per 24 hour  Intake 604.51 ml  Output --  Net 604.51 ml     Physical Exam  Awake Alert x 1, No new F.N deficits, anxious  [place].AT,PERRAL Supple Neck, No JVD,   Symmetrical Chest wall movement, Good air movement bilaterally, CTAB RRR,No Gallops, Rubs or new Murmurs,  +ve B.Sounds, Abd Soft, No tenderness,   L. Hip post op site stable      Data Review:    CBC Recent Labs  Lab 01/27/21 1745 01/29/21 0652 01/30/21 0127  WBC 7.7 13.0* 11.8*  HGB 11.6* 9.2* 8.9*  HCT 37.9 29.8*  28.5*  PLT 227 212 215  MCV 101.6* 101.0* 101.8*  MCH 31.1 31.2 31.8  MCHC 30.6 30.9 31.2  RDW 16.2* 16.0* 16.1*  LYMPHSABS  --  1.2 1.4  MONOABS  --  1.5* 1.6*  EOSABS  --  0.0 0.1  BASOSABS  --  0.0 0.0    Electrolytes Recent Labs  Lab 01/27/21 1745 01/28/21 0730 01/29/21 0120 01/30/21 0127  NA 139  --  136 134*  K 4.9  --  5.6* 4.9  CL 106  --  104 102  CO2 25  --  23 20*  GLUCOSE 101*  --  152* 116*  BUN 40*  --  39* 52*  CREATININE 1.54*  --  1.92* 2.63*  CALCIUM 9.3  --  8.4* 8.2*  AST 28  --  28 26  ALT 11  --  14 8  ALKPHOS 56  --  44 49  BILITOT 0.4  --  0.6 0.6  ALBUMIN 3.4*  --  2.9* 3.0*  MG  --   --  2.0 2.1  INR 1.1 1.2 1.2 1.2  BNP  --   --  497.8*  173.2*    ------------------------------------------------------------------------------------------------------------------ No results for input(s): CHOL, HDL, LDLCALC, TRIG, CHOLHDL, LDLDIRECT in the last 72 hours.  Lab Results  Component Value Date   HGBA1C 6.3 (H) 05/09/2015    No results for input(s): TSH, T4TOTAL, T3FREE, THYROIDAB in the last 72 hours.  Invalid input(s): FREET3 ------------------------------------------------------------------------------------------------------------------ ID Labs Recent Labs  Lab 01/27/21 1745 01/29/21 0120 01/29/21 0652 01/30/21 0127  WBC 7.7  --  13.0* 11.8*  PLT 227  --  212 215  CREATININE 1.54* 1.92*  --  2.63*   Cardiac Enzymes No results for input(s): CKMB, TROPONINI, MYOGLOBIN in the last 168 hours.  Invalid input(s): CK   Radiology Reports CT Head Wo Contrast  Result Date: 01/28/2021 CLINICAL DATA:  Fall. EXAM: CT HEAD WITHOUT CONTRAST CT CERVICAL SPINE WITHOUT CONTRAST TECHNIQUE: Multidetector CT imaging of the head and cervical spine was performed following the standard protocol without intravenous contrast. Multiplanar CT image reconstructions of the cervical spine were also generated. COMPARISON:  CT head and cervical spine 01/27/2021. FINDINGS: CT HEAD FINDINGS Brain: No evidence of acute infarction, hemorrhage, hydrocephalus, extra-axial collection or mass lesion/mass effect. Again seen is mild diffuse atrophy. There is stable mild periventricular white matter hypodensity, likely chronic small vessel ischemic change. Vascular: Atherosclerotic calcifications are present within the cavernous internal carotid arteries. Skull: Normal. Negative for fracture or focal lesion. Sinuses/Orbits: No acute finding. Other: None. CT CERVICAL SPINE FINDINGS Alignment: There is stable grade 1 anterolisthesis at C3-C4 and C4-C5 which is favored as degenerative. Spinal alignment is otherwise within normal limits. Skull base and vertebrae: No  acute fracture. No primary bone lesion or focal pathologic process. There is stable mild chronic compression deformity of T1. Soft tissues and spinal canal: No prevertebral fluid or swelling. No visible canal hematoma. Disc levels: Again seen is moderate severe disc space narrowing throughout the cervical spine with endplate osteophyte formation most significant at C5-C6. At C5-C6 there is some mild bilateral neural foraminal stenosis, unchanged. There is no significant central canal stenosis identified at any level. Upper chest: Negative. Other: None. IMPRESSION: No acute intracranial process. No acute fracture or traumatic subluxation of the cervical spine. Electronically Signed   By: Ronney Asters M.D.   On: 01/28/2021 00:01   CT HEAD WO CONTRAST  Result Date: 01/27/2021 CLINICAL DATA:  Head trauma, minor (Age >=  65y); Neck trauma (Age >= 65y) EXAM: CT HEAD WITHOUT CONTRAST CT CERVICAL SPINE WITHOUT CONTRAST TECHNIQUE: Multidetector CT imaging of the head and cervical spine was performed following the standard protocol without intravenous contrast. Multiplanar CT image reconstructions of the cervical spine were also generated. COMPARISON:  12/29/2020 FINDINGS: CT HEAD FINDINGS Brain: Normal anatomic configuration. Parenchymal volume loss is commensurate with the patient's age. Mild periventricular white matter changes are present likely reflecting the sequela of small vessel ischemia. No abnormal intra or extra-axial mass lesion or fluid collection. No abnormal mass effect or midline shift. No evidence of acute intracranial hemorrhage or infarct. Ventricular size is normal. Cerebellum unremarkable. Vascular: No asymmetric hyperdense vasculature at the skull base. Advanced vascular calcifications are noted within the carotid siphons and terminal vertebral arteries. Skull: Intact Sinuses/Orbits: Paranasal sinuses are clear. The ocular lenses have been removed. Orbits are otherwise unremarkable. Other: Mastoid  air cells and middle ear cavities are clear. CT CERVICAL SPINE FINDINGS Alignment: There is straightening of the cervical spine, unchanged from prior examination. There is stable anterolisthesis of C3 upon C4 and C4 upon C5 of approximately 2-3 mm. Stable retrolisthesis of C5 upon C6 of 3 mm. Skull base and vertebrae: Craniocervical alignment is normal. The atlantodental interval is not widened. There are advanced degenerative changes involving the atlantodental articulation as well as the right atlantoaxial articulation. Moderate degenerative arthritis noted involving the occipitoatlantal articulation bilaterally as well as the left atlantoaxial articulation. No acute fracture of the cervical spine. Remote superior endplate fracture of T1 with mild loss of height is unchanged. There is again noted ankylosis of the right C2-3, C4-5, and C7-T1 facets as well as the left C2-3 and C4-5 facet joints. Soft tissues and spinal canal: No prevertebral fluid or swelling. No visible canal hematoma. No significant canal stenosis. Disc levels: There is marked intervertebral disc space narrowing and endplate remodeling throughout the cervical spine, most severe at C4-C7 in keeping with changes of moderate to severe degenerative disc disease. The prevertebral soft tissues are not thickened on sagittal reformats. Review of the axial images demonstrates multilevel advanced uncovertebral and facet arthrosis resulting in mild-to-moderate left neuroforaminal narrowing at C3-4, mild right and moderate left neuroforaminal narrowing at C5-6. Upper chest: Asymmetric right apical parenchymal scarring. Moderate atherosclerotic calcification within the right carotid bifurcation. Other: None IMPRESSION: No acute intracranial injury.  No calvarial fracture. No acute fracture or listhesis of the cervical spine. Electronically Signed   By: Fidela Salisbury M.D.   On: 01/27/2021 19:51   CT Cervical Spine Wo Contrast  Result Date:  01/28/2021 CLINICAL DATA:  Fall. EXAM: CT HEAD WITHOUT CONTRAST CT CERVICAL SPINE WITHOUT CONTRAST TECHNIQUE: Multidetector CT imaging of the head and cervical spine was performed following the standard protocol without intravenous contrast. Multiplanar CT image reconstructions of the cervical spine were also generated. COMPARISON:  CT head and cervical spine 01/27/2021. FINDINGS: CT HEAD FINDINGS Brain: No evidence of acute infarction, hemorrhage, hydrocephalus, extra-axial collection or mass lesion/mass effect. Again seen is mild diffuse atrophy. There is stable mild periventricular white matter hypodensity, likely chronic small vessel ischemic change. Vascular: Atherosclerotic calcifications are present within the cavernous internal carotid arteries. Skull: Normal. Negative for fracture or focal lesion. Sinuses/Orbits: No acute finding. Other: None. CT CERVICAL SPINE FINDINGS Alignment: There is stable grade 1 anterolisthesis at C3-C4 and C4-C5 which is favored as degenerative. Spinal alignment is otherwise within normal limits. Skull base and vertebrae: No acute fracture. No primary bone lesion or focal pathologic process. There is  stable mild chronic compression deformity of T1. Soft tissues and spinal canal: No prevertebral fluid or swelling. No visible canal hematoma. Disc levels: Again seen is moderate severe disc space narrowing throughout the cervical spine with endplate osteophyte formation most significant at C5-C6. At C5-C6 there is some mild bilateral neural foraminal stenosis, unchanged. There is no significant central canal stenosis identified at any level. Upper chest: Negative. Other: None. IMPRESSION: No acute intracranial process. No acute fracture or traumatic subluxation of the cervical spine. Electronically Signed   By: Ronney Asters M.D.   On: 01/28/2021 00:01   CT CERVICAL SPINE WO CONTRAST  Result Date: 01/27/2021 CLINICAL DATA:  Head trauma, minor (Age >= 65y); Neck trauma (Age >=  65y) EXAM: CT HEAD WITHOUT CONTRAST CT CERVICAL SPINE WITHOUT CONTRAST TECHNIQUE: Multidetector CT imaging of the head and cervical spine was performed following the standard protocol without intravenous contrast. Multiplanar CT image reconstructions of the cervical spine were also generated. COMPARISON:  12/29/2020 FINDINGS: CT HEAD FINDINGS Brain: Normal anatomic configuration. Parenchymal volume loss is commensurate with the patient's age. Mild periventricular white matter changes are present likely reflecting the sequela of small vessel ischemia. No abnormal intra or extra-axial mass lesion or fluid collection. No abnormal mass effect or midline shift. No evidence of acute intracranial hemorrhage or infarct. Ventricular size is normal. Cerebellum unremarkable. Vascular: No asymmetric hyperdense vasculature at the skull base. Advanced vascular calcifications are noted within the carotid siphons and terminal vertebral arteries. Skull: Intact Sinuses/Orbits: Paranasal sinuses are clear. The ocular lenses have been removed. Orbits are otherwise unremarkable. Other: Mastoid air cells and middle ear cavities are clear. CT CERVICAL SPINE FINDINGS Alignment: There is straightening of the cervical spine, unchanged from prior examination. There is stable anterolisthesis of C3 upon C4 and C4 upon C5 of approximately 2-3 mm. Stable retrolisthesis of C5 upon C6 of 3 mm. Skull base and vertebrae: Craniocervical alignment is normal. The atlantodental interval is not widened. There are advanced degenerative changes involving the atlantodental articulation as well as the right atlantoaxial articulation. Moderate degenerative arthritis noted involving the occipitoatlantal articulation bilaterally as well as the left atlantoaxial articulation. No acute fracture of the cervical spine. Remote superior endplate fracture of T1 with mild loss of height is unchanged. There is again noted ankylosis of the right C2-3, C4-5, and C7-T1  facets as well as the left C2-3 and C4-5 facet joints. Soft tissues and spinal canal: No prevertebral fluid or swelling. No visible canal hematoma. No significant canal stenosis. Disc levels: There is marked intervertebral disc space narrowing and endplate remodeling throughout the cervical spine, most severe at C4-C7 in keeping with changes of moderate to severe degenerative disc disease. The prevertebral soft tissues are not thickened on sagittal reformats. Review of the axial images demonstrates multilevel advanced uncovertebral and facet arthrosis resulting in mild-to-moderate left neuroforaminal narrowing at C3-4, mild right and moderate left neuroforaminal narrowing at C5-6. Upper chest: Asymmetric right apical parenchymal scarring. Moderate atherosclerotic calcification within the right carotid bifurcation. Other: None IMPRESSION: No acute intracranial injury.  No calvarial fracture. No acute fracture or listhesis of the cervical spine. Electronically Signed   By: Fidela Salisbury M.D.   On: 01/27/2021 19:51   Pelvis Portable  Result Date: 01/28/2021 CLINICAL DATA:  Status post ORIF of the LEFT femur EXAM: PORTABLE PELVIS 1 VIEWS COMPARISON:  January 28, 2021 FINDINGS: Status post ORIF of the LEFT femur. Orthopedic hardware is intact and without periprosthetic fracture or lucency. Soft tissue air. Persistent distraction of  the lesser trochanter. Fracture fragments are in improved near anatomic alignment. Osteopenia. Pelvic phleboliths. IMPRESSION: Expected postsurgical appearance status post ORIF of the LEFT femur. Electronically Signed   By: Valentino Saxon M.D.   On: 01/28/2021 16:47   CT T-SPINE NO CHARGE  Result Date: 01/28/2021 CLINICAL DATA:  Fall EXAM: CT Thoracic and Lumbar spine without contrast TECHNIQUE: Multiplanar CT images of the thoracic and lumbar spine were reconstructed from contemporary CT of the Chest, Abdomen, and Pelvis CONTRAST:  None COMPARISON:  None. FINDINGS: CT THORACIC  SPINE FINDINGS Alignment: Normal thoracic kyphosis. Vertebrae: No acute fracture or focal pathologic process. Paraspinal and other soft tissues: Better evaluated on dedicated CT chest. Disc levels: Mild degenerative changes of the mid/lower thoracic spine. Superior endplate Schmorl's node deformity at T11. Spinal canal is patent. CT LUMBAR SPINE FINDINGS Segmentation: 5 lumbar type vertebral bodies. Alignment: Normal lumbar lordosis. Vertebrae: No acute fracture or focal pathologic process. Paraspinal and other soft tissues: Better evaluated on dedicated CT abdomen/pelvis. Disc levels: Mild multilevel degenerative changes, most prominent at L3-4. Spinal canal is patent. IMPRESSION: No evidence of traumatic injury to the thoracolumbar spine. Mild multilevel degenerative changes, as above. Electronically Signed   By: Julian Hy M.D.   On: 01/28/2021 00:14   CT L-SPINE NO CHARGE  Result Date: 01/28/2021 CLINICAL DATA:  Fall EXAM: CT Thoracic and Lumbar spine without contrast TECHNIQUE: Multiplanar CT images of the thoracic and lumbar spine were reconstructed from contemporary CT of the Chest, Abdomen, and Pelvis CONTRAST:  None COMPARISON:  None. FINDINGS: CT THORACIC SPINE FINDINGS Alignment: Normal thoracic kyphosis. Vertebrae: No acute fracture or focal pathologic process. Paraspinal and other soft tissues: Better evaluated on dedicated CT chest. Disc levels: Mild degenerative changes of the mid/lower thoracic spine. Superior endplate Schmorl's node deformity at T11. Spinal canal is patent. CT LUMBAR SPINE FINDINGS Segmentation: 5 lumbar type vertebral bodies. Alignment: Normal lumbar lordosis. Vertebrae: No acute fracture or focal pathologic process. Paraspinal and other soft tissues: Better evaluated on dedicated CT abdomen/pelvis. Disc levels: Mild multilevel degenerative changes, most prominent at L3-4. Spinal canal is patent. IMPRESSION: No evidence of traumatic injury to the thoracolumbar spine.  Mild multilevel degenerative changes, as above. Electronically Signed   By: Julian Hy M.D.   On: 01/28/2021 00:14   DG Chest Port 1 View  Result Date: 01/29/2021 CLINICAL DATA:  Shortness of breath EXAM: PORTABLE CHEST 1 VIEW COMPARISON:  01/27/2021 FINDINGS: Unchanged mildly enlarged cardiac and normal mediastinal contours when accounting differences in positioning. Aortic atherosclerosis. Evaluation of the lung basis is somewhat limited by field of view; however no definite pleural effusion is seen. No focal pulmonary opacity. No acute osseous abnormality. IMPRESSION: No acute cardiopulmonary process. Electronically Signed   By: Merilyn Baba M.D.   On: 01/29/2021 11:21   DG Chest Port 1 View  Result Date: 01/27/2021 CLINICAL DATA:  Post fall with left hip pain. EXAM: PORTABLE CHEST 1 VIEW COMPARISON:  12/29/2020 FINDINGS: Stable upper normal heart size. Unchanged mediastinal contours with aortic atherosclerosis. Mitral annulus calcifications. No pneumothorax, pleural effusion, or focal airspace disease. No pulmonary edema. The bones are under mineralized without acute osseous abnormalities. IMPRESSION: No acute chest findings. Electronically Signed   By: Keith Rake M.D.   On: 01/27/2021 18:28   DG Knee Left Port  Result Date: 01/27/2021 CLINICAL DATA:  Fall. EXAM: LEFT FEMUR PORTABLE 1 VIEW; PORTABLE LEFT KNEE - 1-2 VIEW COMPARISON:  The left knee x-ray 12/31/2015. FINDINGS: Left knee: Left knee arthroplasty appears  in anatomic alignment. There is no evidence for hardware loosening or acute fracture. There is no significant joint effusion. Vascular calcifications are seen in the soft tissues. Left femur: No acute fracture or dislocation identified. There are vascular calcifications in the soft tissues. IMPRESSION: 1. No acute fracture or dislocation of the left femur or left knee. 2. Left knee arthroplasty in anatomic alignment. Electronically Signed   By: Ronney Asters M.D.   On:  01/27/2021 21:19   DG C-Arm 1-60 Min-No Report  Result Date: 01/28/2021 CLINICAL DATA:  Left hip ORIF EXAM: OPERATIVE LEFT HIP (WITH PELVIS IF PERFORMED) 4 VIEWS TECHNIQUE: Fluoroscopic spot image(s) were submitted for interpretation post-operatively. COMPARISON:  Left hip radiographs-01/27/2021; CT abdomen pelvis-01/27/2021 FINDINGS: Four spot intraoperative fluoroscopic images of the left hip are provided for review. Provided images demonstrate the sequela of intramedullary rod fixation of the proximal femur and dynamic screw fixation of the femoral neck. The distal end of the femoral rod is transfixed with a single cancellous screw. Alignment of the intertrochanteric femur fracture appears near anatomic though there is persistent displacement of the lesser trochanter. Minimal amount of adjacent subcutaneous emphysema. No radiopaque foreign body. IMPRESSION: Post uncomplicated ORIF left intertrochanteric femur fracture. Electronically Signed   By: Sandi Mariscal M.D.   On: 01/28/2021 15:30   DG HIP OPERATIVE UNILAT WITH PELVIS LEFT  Result Date: 01/28/2021 CLINICAL DATA:  Left hip ORIF EXAM: OPERATIVE LEFT HIP (WITH PELVIS IF PERFORMED) 4 VIEWS TECHNIQUE: Fluoroscopic spot image(s) were submitted for interpretation post-operatively. COMPARISON:  Left hip radiographs-01/27/2021; CT abdomen pelvis-01/27/2021 FINDINGS: Four spot intraoperative fluoroscopic images of the left hip are provided for review. Provided images demonstrate the sequela of intramedullary rod fixation of the proximal femur and dynamic screw fixation of the femoral neck. The distal end of the femoral rod is transfixed with a single cancellous screw. Alignment of the intertrochanteric femur fracture appears near anatomic though there is persistent displacement of the lesser trochanter. Minimal amount of adjacent subcutaneous emphysema. No radiopaque foreign body. IMPRESSION: Post uncomplicated ORIF left intertrochanteric femur fracture.  Electronically Signed   By: Sandi Mariscal M.D.   On: 01/28/2021 15:30   DG Hip Unilat W or Wo Pelvis 2-3 Views Left  Result Date: 01/27/2021 CLINICAL DATA:  Post fall with left hip pain. EXAM: DG HIP (WITH OR WITHOUT PELVIS) 2-3V LEFT COMPARISON:  Pelvis radiograph 12/29/2020 FINDINGS: Bones are diffusely under mineralized. No visualized fracture of the pelvis or left hip. Femoral head is seated. Mild left hip osteoarthritis with joint space narrowing and spurring. No visualized pubic rami fracture. Pubic symphysis and sacroiliac joints are congruent. IMPRESSION: No fracture of the pelvis or left hip. Electronically Signed   By: Keith Rake M.D.   On: 01/27/2021 18:30   DG Femur Portable 1 View Left  Result Date: 01/27/2021 CLINICAL DATA:  Fall. EXAM: LEFT FEMUR PORTABLE 1 VIEW; PORTABLE LEFT KNEE - 1-2 VIEW COMPARISON:  The left knee x-ray 12/31/2015. FINDINGS: Left knee: Left knee arthroplasty appears in anatomic alignment. There is no evidence for hardware loosening or acute fracture. There is no significant joint effusion. Vascular calcifications are seen in the soft tissues. Left femur: No acute fracture or dislocation identified. There are vascular calcifications in the soft tissues. IMPRESSION: 1. No acute fracture or dislocation of the left femur or left knee. 2. Left knee arthroplasty in anatomic alignment. Electronically Signed   By: Ronney Asters M.D.   On: 01/27/2021 21:19   CT CHEST ABDOMEN PELVIS WO CONTRAST  Result Date: 01/28/2021 CLINICAL DATA:  Fall, left hip pain EXAM: CT CHEST, ABDOMEN AND PELVIS WITHOUT CONTRAST TECHNIQUE: Multidetector CT imaging of the chest, abdomen and pelvis was performed following the standard protocol without IV contrast. COMPARISON:  None. FINDINGS: CT CHEST FINDINGS Cardiovascular: The heart is normal in size. No pericardial effusion. No evidence of thoracic aortic aneurysm. Atherosclerotic calcifications of the arch. Mitral valve annular  calcifications. Mild coronary atherosclerosis of the LAD. Mediastinum/Nodes: No suspicious mediastinal lymphadenopathy. Visualized thyroid is unremarkable. Lungs/Pleura: Biapical pleural-parenchymal scarring. No suspicious pulmonary nodules. Very mild centrilobular emphysematous changes in the upper lobes. No focal consolidation or aspiration. No pleural effusion or pneumothorax. Musculoskeletal: No fracture is seen. Sternum, clavicles, scapulae, and bilateral ribs are intact. Dedicated thoracic spine has been performed and will be reported separately. CT ABDOMEN PELVIS FINDINGS Hepatobiliary: Scattered hepatic cysts measuring up to 2.9 cm, benign. No perihepatic fluid/hemorrhage. Status post cholecystectomy. No intrahepatic or extrahepatic duct dilatation. Pancreas: Within normal limits. Spleen: Within normal limits.  No perisplenic fluid/hemorrhage. Adrenals/Urinary Tract: Adrenal glands are within normal limits. Bilateral renal cysts, measuring up to 5.3 cm in the anterior left upper kidney, benign. No hydronephrosis. Bladder is within normal limits. Stomach/Bowel: Stomach is notable for a small hiatal hernia. No evidence of bowel obstruction. Appendix is not discretely visualized. Sigmoid diverticulosis, without evidence of diverticulitis. Vascular/Lymphatic: No evidence of abdominal aortic aneurysm. Atherosclerotic calcifications of the abdominal aorta and branch vessels. No suspicious abdominopelvic lymphadenopathy. Reproductive: Status post hysterectomy. Right ovary is within normal limits. 4.2 cm simple left ovarian cyst (series 3/image 99). No follow-up imaging recommended. Note: This recommendation does not apply to premenarchal patients and to those with increased risk (genetic, family history, elevated tumor markers or other high-risk factors) of ovarian cancer. Reference: JACR 2020 Feb; 17(2):248-254 Other: No abdominopelvic ascites. No hemoperitoneum or free air. Musculoskeletal: Comminuted  intertrochanteric left hip fracture with mildly displaced lesser trochanter fragment and mild foreshortening. Overlying minimal subcutaneous bruising/hemorrhage (series 3/image 124). Visualized bony pelvis and right femur are intact. Mild degenerative changes of the bilateral hips. Left gluteal intramuscular lipoma (series 3/image 91), benign. Dedicated lumbar spine has been performed and will be reported separately. IMPRESSION: Comminuted, displaced left hip fracture, as above. Otherwise, no evidence of traumatic injury to the chest/abdomen. Dedicated thoracolumbar spine has been performed and will be reported separately. Electronically Signed   By: Julian Hy M.D.   On: 01/28/2021 00:12

## 2021-01-30 NOTE — Progress Notes (Signed)
Very agitated, screaming, trying to get OOB, ativan given

## 2021-01-30 NOTE — TOC Benefit Eligibility Note (Signed)
Patient Product/process development scientist completed.    Patient has no Pharmacy insurance coverage  [name], CPhT Pharmacy Patient Advocate Specialist [facility] Patient Advocate Team Direct Number: [phone]  Fax: [phone]

## 2021-01-30 NOTE — Progress Notes (Addendum)
ANTICOAGULATION CONSULT NOTE - Initial Consult  Pharmacy Consult for warfarin Indication:  hx DVT (12/2020)  Allergies  Allergen Reactions   Contrast Media [Iodinated Diagnostic Agents] Other (See Comments)    Causes patient to pass out   Elemental Sulfur Anaphylaxis   Penicillins Anaphylaxis    Has patient had a PCN reaction causing immediate rash, facial/tongue/throat swelling, SOB or lightheadedness with hypotension: Yes Has patient had a PCN reaction causing severe rash involving mucus membranes or skin necrosis:  Unknown Has patient had a PCN reaction that required hospitalization: No  Has patient had a PCN reaction occurring within the last 10 years: No  If all of the above answers are "NO", then may proceed with Cephalosporin use.    Penicillins Anaphylaxis   Codeine Nausea Only    severe   Cortisone     Break out   Sulfa Antibiotics     Patient Measurements: Height: 5\' 7"  (170.2 cm) Weight: 70.9 kg (156 lb 4.9 oz) IBW/kg (Calculated) : 61.6 Heparin Dosing Weight:   Vital Signs: Temp: 98.5 F (36.9 C) (11/15 0559) Temp Source: Axillary (11/15 0559) BP: 115/55 (11/15 0559) Pulse Rate: 64 (11/15 0559)  Labs: Recent Labs    01/27/21 1745 01/27/21 2011 01/28/21 0730 01/29/21 0120 01/29/21 0652 01/30/21 0127  HGB 11.6*  --   --   --  9.2* 8.9*  HCT 37.9  --   --   --  29.8* 28.5*  PLT 227  --   --   --  212 215  LABPROT 14.5  --  14.8 14.7  --  15.0  INR 1.1  --  1.2 1.2  --  1.2  CREATININE 1.54*  --   --  1.92*  --  2.63*  TROPONINIHS 14 12  --   --   --   --      Estimated Creatinine Clearance: 14.1 mL/min (A) (by C-G formula based on SCr of 2.63 mg/dL (H)).   Medical History: Past Medical History:  Diagnosis Date   Dementia (HCC)    DVT, lower extremity (HCC)    Hemorrhoids    Hypertension    Hypothyroidism    Thyroid disease     Medications:  Medications Prior to Admission  Medication Sig Dispense Refill Last Dose   ALPRAZolam (XANAX)  0.25 MG tablet Take 1 tablet (0.25 mg total) by mouth 2 (two) times daily as needed for anxiety (Anxiety or delirium). (Patient taking differently: Take 0.25 mg by mouth every 12 (twelve) hours as needed for anxiety.) 10 tablet 0 01/27/2021   amLODipine (NORVASC) 2.5 MG tablet Take 1 tablet (2.5 mg total) by mouth daily. 30 tablet 0 01/27/2021   citalopram (CELEXA) 10 MG tablet Take 10 mg by mouth See admin instructions. Qd x 7 days   01/27/2021   docusate sodium (COLACE) 100 MG capsule Take 1 capsule (100 mg total) by mouth 2 (two) times daily. 10 capsule 0 01/27/2021   latanoprost (XALATAN) 0.005 % ophthalmic solution Place 1 drop into both eyes at bedtime.   01/26/2021   levothyroxine (SYNTHROID) 175 MCG tablet Take 175 mcg by mouth daily before breakfast.   01/27/2021   LORazepam (ATIVAN) 0.5 MG tablet Take 0.25 mg by mouth in the morning and at bedtime.   01/27/2021   metoprolol succinate (TOPROL-XL) 100 MG 24 hr tablet Take 100 mg by mouth daily.   01/27/2021 at 0800   NON FORMULARY Take 1 Dose by mouth daily. Health shake supplement daily at 3pm  QUEtiapine (SEROQUEL) 25 MG tablet Take 12.5 mg by mouth 2 (two) times daily.   01/27/2021   rosuvastatin (CRESTOR) 10 MG tablet Take 10 mg by mouth See admin instructions. Twice a week on Monday and Friday   01/26/2021   spironolactone (ALDACTONE) 25 MG tablet Take 25 mg by mouth daily.   01/27/2021   vitamin B-12 (CYANOCOBALAMIN) 1000 MCG tablet Take 1,000 mcg by mouth daily.   01/27/2021   warfarin (COUMADIN) 3 MG tablet Take 3 mg by mouth daily.   01/26/2021 at 1700   citalopram (CELEXA) 20 MG tablet Take 20 mg by mouth daily.      doxycycline (VIBRAMYCIN) 100 MG capsule Take 1 capsule (100 mg total) by mouth 2 (two) times daily. One po bid x 7 days (Patient not taking: Reported on 01/28/2021) 14 capsule 0 Completed Course   RIVAROXABAN (XARELTO) VTE STARTER PACK (15 & 20 MG) Follow package directions: Take one 15mg  tablet by mouth twice a  day. On day 22, switch to one 20mg  tablet once a day. Take with food. (Patient not taking: No sig reported) 51 each 0 Not Taking   warfarin (COUMADIN) 2 MG tablet Take 2 mg by mouth daily. (Patient not taking: No sig reported)   Not Taking    Assessment: 87 yo W on warfarin PTA for 12/29/20 LLE age-indeterminate DVT admitted for unwitnessed fall from SNF. Admit INR 1.1. She is s/p left hip trochanteric femoral nailing on 11/13. Pharmacy consulted to resume warfarin.  INR unchanged at 1.2 today. Hgb down to 8.9 post-op. No bleeding. Very poor PO intake per RN, will encourage Ensure supplements.  PTA warfarin: 3 mg/day although INR 1.1 on admit  Goal of Therapy:  INR 2-3 Monitor platelets by anticoagulation protocol: Yes   Plan:  Warfarin 5 mg x1 INR daily, CBC Monitor for s/sx of bleeding  Thank you for involving pharmacy in this patient's care.  12/31/20, PharmD, BCPS, BCCP Clinical Pharmacist  Please check AMION for all [facility] Pharm[facility] phone numbers After 10:00 PM, call Main Pharmacy [phone]

## 2021-01-30 NOTE — Progress Notes (Signed)
Subjective: 2 Days Post-Op s/p Procedure(s): INTRAMEDULLARY (IM) NAIL INTERTROCHANTRIC   Patient is alert, laying in bed, pleasantly confused. Says "no no" when trying to change bandage, but does allow me to change. Denies chest pain, SOB, Calf pain, nausea/vomiting when asked.   Objective:  PE: VITALS:   Vitals:   01/29/21 1027 01/29/21 1620 01/29/21 2005 01/30/21 0559  BP: (!) 126/55 (!) 125/49 (!) 116/50 (!) 115/55  Pulse: (!) 58 (!) 58 65 64  Resp: 18 16 16 17   Temp:  97.7 F (36.5 C) 98.4 F (36.9 C) 98.5 F (36.9 C)  TempSrc:  Oral Oral Axillary  SpO2:  99% 98% 97%  Weight:      Height:       General: laying in bed, in no acute distress Resp: No increased work of breathing Gi: abd soft MSK: LLE dressings with mild drainage at proximal dressing, this was changed this morning. Patient endorses sensation to foot. 2+ DP pulse. Indicates pain with dorsiflexion and plantarflexion.   LABS  Results for orders placed or performed during the hospital encounter of 01/27/21 (from the past 24 hour(s))  CBC with Differential/Platelet     Status: Abnormal   Collection Time: 01/29/21  6:52 AM  Result Value Ref Range   WBC 13.0 (H) 4.0 - 10.5 K/uL   RBC 2.95 (L) 3.87 - 5.11 MIL/uL   Hemoglobin 9.2 (L) 12.0 - 15.0 g/dL   HCT 01/31/21 (L) 27.2 - 53.6 %   MCV 101.0 (H) 80.0 - 100.0 fL   MCH 31.2 26.0 - 34.0 pg   MCHC 30.9 30.0 - 36.0 g/dL   RDW 64.4 (H) 03.4 - 74.2 %   Platelets 212 150 - 400 K/uL   nRBC 0.0 0.0 - 0.2 %   Neutrophils Relative % 78 %   Neutro Abs 10.2 (H) 1.7 - 7.7 K/uL   Lymphocytes Relative 10 %   Lymphs Abs 1.2 0.7 - 4.0 K/uL   Monocytes Relative 11 %   Monocytes Absolute 1.5 (H) 0.1 - 1.0 K/uL   Eosinophils Relative 0 %   Eosinophils Absolute 0.0 0.0 - 0.5 K/uL   Basophils Relative 0 %   Basophils Absolute 0.0 0.0 - 0.1 K/uL   Immature Granulocytes 1 %   Abs Immature Granulocytes 0.10 (H) 0.00 - 0.07 K/uL  Osmolality     Status: Abnormal    Collection Time: 01/29/21  6:52 AM  Result Value Ref Range   Osmolality 310 (H) 275 - 295 mOsm/kg  Uric acid     Status: Abnormal   Collection Time: 01/29/21  6:52 AM  Result Value Ref Range   Uric Acid, Serum 9.8 (H) 2.5 - 7.1 mg/dL  Magnesium     Status: None   Collection Time: 01/30/21  1:27 AM  Result Value Ref Range   Magnesium 2.1 1.7 - 2.4 mg/dL  Comprehensive metabolic panel     Status: Abnormal   Collection Time: 01/30/21  1:27 AM  Result Value Ref Range   Sodium 134 (L) 135 - 145 mmol/L   Potassium 4.9 3.5 - 5.1 mmol/L   Chloride 102 98 - 111 mmol/L   CO2 20 (L) 22 - 32 mmol/L   Glucose, Bld 116 (H) 70 - 99 mg/dL   BUN 52 (H) 8 - 23 mg/dL   Creatinine, Ser 02/01/21 (H) 0.44 - 1.00 mg/dL   Calcium 8.2 (L) 8.9 - 10.3 mg/dL   Total Protein 6.0 (L) 6.5 - 8.1 g/dL  Albumin 3.0 (L) 3.5 - 5.0 g/dL   AST 26 15 - 41 U/L   ALT 8 0 - 44 U/L   Alkaline Phosphatase 49 38 - 126 U/L   Total Bilirubin 0.6 0.3 - 1.2 mg/dL   GFR, Estimated 17 (L) >60 mL/min   Anion gap 12 5 - 15  Brain natriuretic peptide     Status: Abnormal   Collection Time: 01/30/21  1:27 AM  Result Value Ref Range   B Natriuretic Peptide 173.2 (H) 0.0 - 100.0 pg/mL  CBC with Differential/Platelet     Status: Abnormal   Collection Time: 01/30/21  1:27 AM  Result Value Ref Range   WBC 11.8 (H) 4.0 - 10.5 K/uL   RBC 2.80 (L) 3.87 - 5.11 MIL/uL   Hemoglobin 8.9 (L) 12.0 - 15.0 g/dL   HCT 50.3 (L) 88.8 - 28.0 %   MCV 101.8 (H) 80.0 - 100.0 fL   MCH 31.8 26.0 - 34.0 pg   MCHC 31.2 30.0 - 36.0 g/dL   RDW 03.4 (H) 91.7 - 91.5 %   Platelets 215 150 - 400 K/uL   nRBC 0.0 0.0 - 0.2 %   Neutrophils Relative % 72 %   Neutro Abs 8.5 (H) 1.7 - 7.7 K/uL   Lymphocytes Relative 12 %   Lymphs Abs 1.4 0.7 - 4.0 K/uL   Monocytes Relative 14 %   Monocytes Absolute 1.6 (H) 0.1 - 1.0 K/uL   Eosinophils Relative 1 %   Eosinophils Absolute 0.1 0.0 - 0.5 K/uL   Basophils Relative 0 %   Basophils Absolute 0.0 0.0 - 0.1 K/uL    Immature Granulocytes 1 %   Abs Immature Granulocytes 0.10 (H) 0.00 - 0.07 K/uL  Protime-INR     Status: None   Collection Time: 01/30/21  1:27 AM  Result Value Ref Range   Prothrombin Time 15.0 11.4 - 15.2 seconds   INR 1.2 0.8 - 1.2    Pelvis Portable  Result Date: 01/28/2021 CLINICAL DATA:  Status post ORIF of the LEFT femur EXAM: PORTABLE PELVIS 1 VIEWS COMPARISON:  January 28, 2021 FINDINGS: Status post ORIF of the LEFT femur. Orthopedic hardware is intact and without periprosthetic fracture or lucency. Soft tissue air. Persistent distraction of the lesser trochanter. Fracture fragments are in improved near anatomic alignment. Osteopenia. Pelvic phleboliths. IMPRESSION: Expected postsurgical appearance status post ORIF of the LEFT femur. Electronically Signed   By: Meda Klinefelter M.D.   On: 01/28/2021 16:47   DG Chest Port 1 View  Result Date: 01/29/2021 CLINICAL DATA:  Shortness of breath EXAM: PORTABLE CHEST 1 VIEW COMPARISON:  01/27/2021 FINDINGS: Unchanged mildly enlarged cardiac and normal mediastinal contours when accounting differences in positioning. Aortic atherosclerosis. Evaluation of the lung basis is somewhat limited by field of view; however no definite pleural effusion is seen. No focal pulmonary opacity. No acute osseous abnormality. IMPRESSION: No acute cardiopulmonary process. Electronically Signed   By: Wiliam Ke M.D.   On: 01/29/2021 11:21   DG C-Arm 1-60 Min-No Report  Result Date: 01/28/2021 CLINICAL DATA:  Left hip ORIF EXAM: OPERATIVE LEFT HIP (WITH PELVIS IF PERFORMED) 4 VIEWS TECHNIQUE: Fluoroscopic spot image(s) were submitted for interpretation post-operatively. COMPARISON:  Left hip radiographs-01/27/2021; CT abdomen pelvis-01/27/2021 FINDINGS: Four spot intraoperative fluoroscopic images of the left hip are provided for review. Provided images demonstrate the sequela of intramedullary rod fixation of the proximal femur and dynamic screw fixation of  the femoral neck. The distal end of the femoral rod is  transfixed with a single cancellous screw. Alignment of the intertrochanteric femur fracture appears near anatomic though there is persistent displacement of the lesser trochanter. Minimal amount of adjacent subcutaneous emphysema. No radiopaque foreign body. IMPRESSION: Post uncomplicated ORIF left intertrochanteric femur fracture. Electronically Signed   By: Simonne Come M.D.   On: 01/28/2021 15:30   DG HIP OPERATIVE UNILAT WITH PELVIS LEFT  Result Date: 01/28/2021 CLINICAL DATA:  Left hip ORIF EXAM: OPERATIVE LEFT HIP (WITH PELVIS IF PERFORMED) 4 VIEWS TECHNIQUE: Fluoroscopic spot image(s) were submitted for interpretation post-operatively. COMPARISON:  Left hip radiographs-01/27/2021; CT abdomen pelvis-01/27/2021 FINDINGS: Four spot intraoperative fluoroscopic images of the left hip are provided for review. Provided images demonstrate the sequela of intramedullary rod fixation of the proximal femur and dynamic screw fixation of the femoral neck. The distal end of the femoral rod is transfixed with a single cancellous screw. Alignment of the intertrochanteric femur fracture appears near anatomic though there is persistent displacement of the lesser trochanter. Minimal amount of adjacent subcutaneous emphysema. No radiopaque foreign body. IMPRESSION: Post uncomplicated ORIF left intertrochanteric femur fracture. Electronically Signed   By: Simonne Come M.D.   On: 01/28/2021 15:30    Assessment/Plan: Left intertochanteric hip fracture 2 Days Post-Op s/p Procedure(s): INTRAMEDULLARY (IM) NAIL INTERTROCHANTRIC  Weightbearing: WBAT LLE, up with therapy Insicional and dressing care: Reinforce dressings as needed, proximal dressing changed this morning, no new drainage since yesterday VTE prophylaxis: acute blood loss anemia - Hbg 8.9 today, warfarin has been restarted will continue to watch Hbg Pain control: continue current regimen, have changed  schedule tramadol to prn Follow - up plan: 2 weeks with Dr. Dion Saucier Dispo: to SNF - TOC on board for placement  Contact information:   Weekdays 8-5 Janine Ores, PA-C [phone] A fter hours and holidays please check [email or web address] for group call information for [facility]  Armida Sans 01/30/2021, 6:45 AM

## 2021-01-30 NOTE — Progress Notes (Signed)
Patient pulled IV, IV team reinserted and had a hard time putting one, will apply mitten to prevent patient to pull line again.

## 2021-01-31 DIAGNOSIS — E440 Moderate protein-calorie malnutrition: Secondary | ICD-10-CM | POA: Insufficient documentation

## 2021-01-31 DIAGNOSIS — W19XXXA Unspecified fall, initial encounter: Secondary | ICD-10-CM

## 2021-01-31 DIAGNOSIS — N179 Acute kidney failure, unspecified: Secondary | ICD-10-CM

## 2021-01-31 DIAGNOSIS — N1831 Chronic kidney disease, stage 3a: Secondary | ICD-10-CM

## 2021-01-31 DIAGNOSIS — Z7189 Other specified counseling: Secondary | ICD-10-CM

## 2021-01-31 LAB — COMPREHENSIVE METABOLIC PANEL
ALT: 8 U/L (ref 0–44)
AST: 30 U/L (ref 15–41)
Albumin: 2.7 g/dL — ABNORMAL LOW (ref 3.5–5.0)
Alkaline Phosphatase: 48 U/L (ref 38–126)
Anion gap: 7 (ref 5–15)
BUN: 51 mg/dL — ABNORMAL HIGH (ref 8–23)
CO2: 25 mmol/L (ref 22–32)
Calcium: 8.3 mg/dL — ABNORMAL LOW (ref 8.9–10.3)
Chloride: 104 mmol/L (ref 98–111)
Creatinine, Ser: 2.22 mg/dL — ABNORMAL HIGH (ref 0.44–1.00)
GFR, Estimated: 21 mL/min — ABNORMAL LOW (ref >60)
Glucose, Bld: 104 mg/dL — ABNORMAL HIGH (ref 70–99)
Potassium: 4.5 mmol/L (ref 3.5–5.1)
Sodium: 136 mmol/L (ref 135–145)
Total Bilirubin: 0.6 mg/dL (ref 0.3–1.2)
Total Protein: 5.7 g/dL — ABNORMAL LOW (ref 6.5–8.1)

## 2021-01-31 LAB — CBC WITH DIFFERENTIAL/PLATELET
Abs Immature Granulocytes: 0.06 10*3/uL (ref 0.00–0.07)
Basophils Absolute: 0 10*3/uL (ref 0.0–0.1)
Basophils Relative: 0 %
Eosinophils Absolute: 0.1 10*3/uL (ref 0.0–0.5)
Eosinophils Relative: 1 %
HCT: 24.7 % — ABNORMAL LOW (ref 36.0–46.0)
Hemoglobin: 7.7 g/dL — ABNORMAL LOW (ref 12.0–15.0)
Immature Granulocytes: 1 %
Lymphocytes Relative: 11 %
Lymphs Abs: 0.9 10*3/uL (ref 0.7–4.0)
MCH: 31.3 pg (ref 26.0–34.0)
MCHC: 31.2 g/dL (ref 30.0–36.0)
MCV: 100.4 fL — ABNORMAL HIGH (ref 80.0–100.0)
Monocytes Absolute: 1.1 10*3/uL — ABNORMAL HIGH (ref 0.1–1.0)
Monocytes Relative: 12 %
Neutro Abs: 6.5 10*3/uL (ref 1.7–7.7)
Neutrophils Relative %: 75 %
Platelets: 182 10*3/uL (ref 150–400)
RBC: 2.46 MIL/uL — ABNORMAL LOW (ref 3.87–5.11)
RDW: 15.9 % — ABNORMAL HIGH (ref 11.5–15.5)
WBC: 8.7 10*3/uL (ref 4.0–10.5)
nRBC: 0 % (ref 0.0–0.2)

## 2021-01-31 LAB — BRAIN NATRIURETIC PEPTIDE: B Natriuretic Peptide: 290.4 pg/mL — ABNORMAL HIGH (ref 0.0–100.0)

## 2021-01-31 LAB — MAGNESIUM: Magnesium: 2.1 mg/dL (ref 1.7–2.4)

## 2021-01-31 LAB — PROTIME-INR
INR: 1.3 — ABNORMAL HIGH (ref 0.8–1.2)
Prothrombin Time: 16 seconds — ABNORMAL HIGH (ref 11.4–15.2)

## 2021-01-31 MED ORDER — LORAZEPAM 1 MG PO TABS: 1.0000 mg | ORAL_TABLET | ORAL | Status: DC | PRN

## 2021-01-31 MED ORDER — DIPHENHYDRAMINE HCL 50 MG/ML IJ SOLN: 12.5000 mg | INTRAMUSCULAR | Status: DC | PRN

## 2021-01-31 MED ORDER — WARFARIN SODIUM 5 MG PO TABS: 5.0000 mg | ORAL_TABLET | Freq: Once | ORAL | Status: DC

## 2021-01-31 MED ORDER — SODIUM CHLORIDE 0.9 % IV SOLN
0.5000 mg/h | INTRAVENOUS | Status: DC
  Administered 2021-01-31: 0.5 mg/h via INTRAVENOUS
  Filled 2021-01-31: qty 5

## 2021-01-31 MED ORDER — SODIUM CHLORIDE 0.9 % IV SOLN
INTRAVENOUS | Status: DC | PRN
  Administered 2021-01-31: 10 mL/h via INTRAVENOUS

## 2021-01-31 MED ORDER — LORAZEPAM 2 MG/ML PO CONC: 1.0000 mg | ORAL | Status: DC | PRN

## 2021-01-31 MED ORDER — LORAZEPAM 2 MG/ML IJ SOLN
1.0000 mg | INTRAMUSCULAR | Status: DC | PRN
  Administered 2021-01-31: 1 mg via INTRAVENOUS
  Filled 2021-01-31: qty 1

## 2021-01-31 MED ORDER — SODIUM CHLORIDE 0.9 % IV SOLN: INTRAVENOUS | Status: DC

## 2021-01-31 MED ORDER — ONDANSETRON HCL 4 MG/2ML IJ SOLN: 4.0000 mg | Freq: Four times a day (QID) | INTRAMUSCULAR | Status: DC | PRN

## 2021-01-31 MED ORDER — HYDROMORPHONE BOLUS VIA INFUSION
0.5000 mg | INTRAVENOUS | Status: DC | PRN
  Filled 2021-01-31: qty 1

## 2021-01-31 MED ORDER — ONDANSETRON 4 MG PO TBDP: 4.0000 mg | ORAL_TABLET | Freq: Four times a day (QID) | ORAL | Status: DC | PRN

## 2021-01-31 NOTE — Care Management Important Message (Signed)
Important Message  Patient Details  Name: [name] MRN: [id number] Date of Birth: October 06, 1929   Medicare Important Message Given:  Yes     Merritt Mccravy Stefan Church 01/31/2021, 2:48 PM

## 2021-01-31 NOTE — Progress Notes (Signed)
Subjective: 3 Days Post-Op s/p Procedure(s): INTRAMEDULLARY (IM) NAIL INTERTROCHANTRIC  Somnolent on exam, patient only moaning when awoken and asking questions to patient.    Objective:  PE: VITALS:   Vitals:   01/30/21 1607 01/30/21 2114 01/31/21 0449 01/31/21 0900  BP: (!) 165/62 (!) 158/61 (!) 138/125 (!) 173/91  Pulse: 78 80 99 90  Resp: 16 16  18   Temp: (!) 97.5 F (36.4 C) 98.6 F (37 C) 98.7 F (37.1 C) 98.1 F (36.7 C)  TempSrc:   Oral Axillary  SpO2: 94% 99% 94% 96%  Weight:      Height:       General: laying in bed, in no acute distress Resp: No increased work of breathing Gi: abd soft MSK: LLE dressings with no drainage. No bandage over distal incision site and purwick catheter laying next to incision. Incision was cleaned and no bandage was placed. Not able to respond to commands for motion or sensation questioning. + DP pulse.   LABS  Results for orders placed or performed during the hospital encounter of 01/27/21 (from the past 24 hour(s))  Magnesium     Status: None   Collection Time: 01/31/21  2:50 AM  Result Value Ref Range   Magnesium 2.1 1.7 - 2.4 mg/dL  Comprehensive metabolic panel     Status: Abnormal   Collection Time: 01/31/21  2:50 AM  Result Value Ref Range   Sodium 136 135 - 145 mmol/L   Potassium 4.5 3.5 - 5.1 mmol/L   Chloride 104 98 - 111 mmol/L   CO2 25 22 - 32 mmol/L   Glucose, Bld 104 (H) 70 - 99 mg/dL   BUN 51 (H) 8 - 23 mg/dL   Creatinine, Ser 02/02/21 (H) 0.44 - 1.00 mg/dL   Calcium 8.3 (L) 8.9 - 10.3 mg/dL   Total Protein 5.7 (L) 6.5 - 8.1 g/dL   Albumin 2.7 (L) 3.5 - 5.0 g/dL   AST 30 15 - 41 U/L   ALT 8 0 - 44 U/L   Alkaline Phosphatase 48 38 - 126 U/L   Total Bilirubin 0.6 0.3 - 1.2 mg/dL   GFR, Estimated 21 (L) >60 mL/min   Anion gap 7 5 - 15  Brain natriuretic peptide     Status: Abnormal   Collection Time: 01/31/21  2:50 AM  Result Value Ref Range   B Natriuretic Peptide 290.4 (H) 0.0 - 100.0 pg/mL  CBC with  Differential/Platelet     Status: Abnormal   Collection Time: 01/31/21  2:50 AM  Result Value Ref Range   WBC 8.7 4.0 - 10.5 K/uL   RBC 2.46 (L) 3.87 - 5.11 MIL/uL   Hemoglobin 7.7 (L) 12.0 - 15.0 g/dL   HCT 02/02/21 (L) 61.9 - 50.9 %   MCV 100.4 (H) 80.0 - 100.0 fL   MCH 31.3 26.0 - 34.0 pg   MCHC 31.2 30.0 - 36.0 g/dL   RDW 32.6 (H) 71.2 - 45.8 %   Platelets 182 150 - 400 K/uL   nRBC 0.0 0.0 - 0.2 %   Neutrophils Relative % 75 %   Neutro Abs 6.5 1.7 - 7.7 K/uL   Lymphocytes Relative 11 %   Lymphs Abs 0.9 0.7 - 4.0 K/uL   Monocytes Relative 12 %   Monocytes Absolute 1.1 (H) 0.1 - 1.0 K/uL   Eosinophils Relative 1 %   Eosinophils Absolute 0.1 0.0 - 0.5 K/uL   Basophils Relative 0 %   Basophils Absolute 0.0  0.0 - 0.1 K/uL   Immature Granulocytes 1 %   Abs Immature Granulocytes 0.06 0.00 - 0.07 K/uL  Protime-INR     Status: Abnormal   Collection Time: 01/31/21  2:50 AM  Result Value Ref Range   Prothrombin Time 16.0 (H) 11.4 - 15.2 seconds   INR 1.3 (H) 0.8 - 1.2    US RENAL  Result Date: 01/30/2021 CLINICAL DATA:  Acute kidney injury EXAM: RENAL / URINARY TRACT ULTRASOUND COMPLETE COMPARISON:  01/27/2021 CT FINDINGS: Right Kidney: Renal measurements: 8.9 x 3.7 x 4.2 cm = volume: 71 mL. Increased renal cortical echogenicity. 4.0 cm exophytic simple cyst emanating from the interpolar region. No shadowing stone or hydronephrosis visualized. Left Kidney: Renal measurements: 9.2 x 5.1 x 5.0 cm = volume: 122 mL. Increased renal cortical echogenicity. 4.9 cm simple cyst at the interpolar region of the left kidney. No shadowing stone or hydronephrosis visualized. Bladder: Appears normal for degree of bladder distention. Other: Several hepatic cysts are incidentally noted. IMPRESSION: 1. Negative for obstructive uropathy. 2. Echogenic kidneys suggest sequela of medical renal disease. Electronically Signed   By: Duanne Guess D.O.   On: 01/30/2021 10:41    Assessment/Plan: Left  intertochanteric hip fracture 3 Days Post-Op s/p Procedure(s): INTRAMEDULLARY (IM) NAIL INTERTROCHANTRIC - per medicine notes, patient has been transitioned to comfort care. Will defer pain control to medicine and remove originally planned discharge narcotics from patient's chart.  - acute blood loss anemia - Hbg trending down to 7.7 today, warfarin has been restarted, husband would like to move toward comfort care - SNF on board for residential hospital facility placement   Contact information:   Weekdays 8-5 Janine Ores, [place] [phone] A fter hours and holidays please check [email or web address] for group call information for [facility]  Armida Sans 01/31/2021, 12:05 PM

## 2021-01-31 NOTE — Progress Notes (Signed)
Pt continues to be very agitated. Able to get her mittens off and pulling lines and tubes. PRN haldol was given at  2011 with minimal effect. Made provider on call aware. X1 Ativan 0.5mg  order. Will continue to closely monitor pt.

## 2021-01-31 NOTE — TOC Initial Note (Addendum)
Transition of Care [facility]) - Initial/Assessment Note    Patient Details  Name: [name] MRN: [id number] Date of Birth: Jul 16, 1929  Transition of Care [facility]) CM/SW Contact:    Kingsley Plan, RN Phone Number: 01/31/2021, 2:55 PM  Clinical Narrative:                  Consult for residential hospice.   NCM called Spouse [name] [phone], a couple of times , no answer , received "busy signal".   Called son [name] [phone] . [name] in agreement with residential hospice. NCM provided choice. [name] will get in touch with his father and give him choices and NCM direct cell phone number.   Reports his father does not have call waiting.   1555 [name] Sr returned call , he would like [facility] . Referral for [facility] given to Chales Abrahams with [facility] Expected Discharge Plan: Hospice Medical Facility     Patient Goals and CMS Choice     Choice offered to / list presented to : Adult Children  Expected Discharge Plan and Services Expected Discharge Plan: Hospice Medical Facility   Discharge Planning Services: CM Consult Post Acute Care Choice: Hospice Living arrangements for the past 2 months: Apartment                 DME Arranged: N/A DME Agency: NA       HH Arranged: NA          Prior Living Arrangements/Services Living arrangements for the past 2 months: Apartment Lives with:: Self                   Activities of Daily Living      Permission Sought/Granted                  Emotional Assessment         Alcohol / Substance Use: Not Applicable Psych Involvement: No (comment)  Admission diagnosis:  Hip fracture (HCC) [S72.009A] Fall [W19.XXXA] Closed left hip fracture (HCC) [S72.002A] Patient Active Problem List   Diagnosis Date Noted   Malnutrition of moderate degree 01/31/2021   Closed left hip fracture (HCC) 01/28/2021   Hip fracture (HCC) 01/28/2021   Concussion with no loss of consciousness    Fall 10/07/2020    Prolonged QT interval 10/07/2020   Dementia (HCC) 10/07/2020   CKD (chronic kidney disease), stage III (HCC) 10/07/2020   Hypothyroidism 10/07/2020   Head contusion 10/07/2020   Hypertensive urgency 10/07/2020   Acute bronchitis with asthma 05/09/2015   Acute bronchitis 05/09/2015   Essential hypertension 06/23/2014   Hyperlipidemia 06/23/2014   Bilateral lower extremity edema 06/23/2014   HYPOTHYROIDISM, POST-RADIATION 10/21/2007   OSTEOARTHRITIS 07/20/2007   DEEP VENOUS THROMBOPHLEBITIS, HX OF 07/20/2007   PCP:  Pcp, No Pharmacy:   [id number] [place], [place] - [address] AT [facility] [address] [place] [address] Phone: [phone] Fax: [phone]  [id number] - [place] [facility] - [address] [place] [address] Phone: [phone] Fax: [phone]     Social Determinants of Health (SDOH) Interventions    Readmission Risk Interventions No flowsheet data found.

## 2021-01-31 NOTE — Progress Notes (Signed)
Civil engineer, contracting [facility]) Hospital Liaison note.    Received request from [facility] manager for family interest in [facility]. Chart and pt information under review by [facility] physician.  Hospice eligibility pending at this time.  Spoke with pt's husband to initiate education of process for transferring to [facility].    [facility] is unable to offer a room today. Hospital Liaison will follow up tomorrow or sooner if a room becomes available. Please do not hesitate to call with questions.   Thank you for the opportunity to participate in this patient's care.  Gillian Scarce, BSN, RN [facility] Liaison (listed on [place] under Hospice/[facility])    [phone] (24h on call)

## 2021-01-31 NOTE — Progress Notes (Addendum)
[facility] PROGRESS NOTE    Progress Note  [name]  [id number] DOB: May 09, 1929 DOA: 01/27/2021 PCP: Pcp, No     Brief Narrative:   [name] is an 87 y.o. female past medical history of dementia, essential hypertension hypothyroidism brought into the ER after an unwitnessed fall living facility was found to have a left hip fracture.  Assessment/Plan:   Acute discontinue statins.  Closed left hip fracture: Orthopedic surgery was consulted status post surgical intervention on 01/29/2019 Family wants to move towards comfort care.  Advance dementia with behavioral disturbances: Minimize narcotics and benzodiazepine. She is currently on Seroquel at night Haldol as needed.  Acute kidney injury on chronic kidney disease stage IIIb: Creatinine 1.5, there was a mild rise in her creatinine she was started on IV fluids. And her creatinine is stabilized this morning at 2.2. Had a long discussion over the phone with husband, he relates that she is not having a good quality of life and that she has expressed to him that if she gets to this point where she cannot enjoy her life or go back home she will want to let nature take its course. We are going to move towards comfort care, discontinue all labs and medications that are not related to comfort and start her on a morphine drip. Will consult social worker to start trying to get her to a residential hospice facility as per husband's request.  Essential hypertension: Blood pressure soft continue to hold all antihypertensive medication.  History of left bundle branch block: Noted.  Hypo-thyroidism:  continue Synthroid.  Chronic microcytic anemia: She has been typed and screened, her hemoglobin, is now 7.7 her hemoglobin has been slowly drift down. Spoke with the husband they would like to move towards comfort care.  Dyslipidemia: Dicntinue statin.  Goals of care/ethics: Has been decided to move towards  comfort care after explaining to him the risk and benefits he would like all labs and medications that are not related to comfort discontinued. He agreed to IV dilaudid as she seems to be in significant amount of pain and he would like for her to go to a residential hospice facility.  I spoke to the husband for over 35 minutes.  DVT prophylaxis: SCD Family Communication:Daughter Status is: Inpatient  Remains inpatient appropriate because: Acute left hip fracture now with acute kidney injury and acute blood loss anemia   Code Status:     Code Status Orders  (From admission, onward)           Start     Ordered   01/28/21 0629  Do not attempt resuscitation (DNR)  Continuous       Question Answer Comment  In the event of cardiac or respiratory ARREST Do not call a "code blue"   In the event of cardiac or respiratory ARREST Do not perform Intubation, CPR, defibrillation or ACLS   In the event of cardiac or respiratory ARREST Use medication by any route, position, wound care, and other measures to relive pain and suffering. May use oxygen, suction and manual treatment of airway obstruction as needed for comfort.      01/28/21 0628           Code Status History     Date Active Date Inactive Code Status Order ID Comments User Context   01/28/2021 0341 01/28/2021 0628 Full Code [id number]  Rise Patience, MD ED   10/07/2020 1405 10/12/2020 1920 Full Code [id number]  Norval Morton, MD  ED   05/09/2015 1126 05/10/2015 1506 Full Code [id number]  Richarda Overlie, MD ED      Advance Directive Documentation    Flowsheet Row Most Recent Value  Type of Advance Directive Out of facility DNR (pink MOST or yellow form)  Pre-existing out of facility DNR order (yellow form or pink MOST form) --  "MOST" Form in Place? --         IV Access:   Peripheral IV   Procedures and diagnostic studies:   US RENAL  Result Date: 01/30/2021 CLINICAL DATA:  Acute kidney injury EXAM: RENAL  / URINARY TRACT ULTRASOUND COMPLETE COMPARISON:  01/27/2021 CT FINDINGS: Right Kidney: Renal measurements: 8.9 x 3.7 x 4.2 cm = volume: 71 mL. Increased renal cortical echogenicity. 4.0 cm exophytic simple cyst emanating from the interpolar region. No shadowing stone or hydronephrosis visualized. Left Kidney: Renal measurements: 9.2 x 5.1 x 5.0 cm = volume: 122 mL. Increased renal cortical echogenicity. 4.9 cm simple cyst at the interpolar region of the left kidney. No shadowing stone or hydronephrosis visualized. Bladder: Appears normal for degree of bladder distention. Other: Several hepatic cysts are incidentally noted. IMPRESSION: 1. Negative for obstructive uropathy. 2. Echogenic kidneys suggest sequela of medical renal disease. Electronically Signed   By: Duanne Guess D.O.   On: 01/30/2021 10:41   DG Chest Port 1 View  Result Date: 01/29/2021 CLINICAL DATA:  Shortness of breath EXAM: PORTABLE CHEST 1 VIEW COMPARISON:  01/27/2021 FINDINGS: Unchanged mildly enlarged cardiac and normal mediastinal contours when accounting differences in positioning. Aortic atherosclerosis. Evaluation of the lung basis is somewhat limited by field of view; however no definite pleural effusion is seen. No focal pulmonary opacity. No acute osseous abnormality. IMPRESSION: No acute cardiopulmonary process. Electronically Signed   By: Wiliam Ke M.D.   On: 01/29/2021 11:21     Medical Consultants:   None.   Subjective:    [name] nonverbal moaning and groaning, probably as she is in pain  Objective:    Vitals:   01/30/21 0559 01/30/21 1607 01/30/21 2114 01/31/21 0449  BP: (!) 115/55 (!) 165/62 (!) 158/61 (!) 138/125  Pulse: 64 78 80 99  Resp: 17 16 16    Temp: 98.5 F (36.9 C) (!) 97.5 F (36.4 C) 98.6 F (37 C) 98.7 F (37.1 C)  TempSrc: Axillary   Oral  SpO2: 97% 94% 99% 94%  Weight:      Height:       SpO2: 94 %   Intake/Output Summary (Last 24 hours) at 01/31/2021 1003 Last  data filed at 01/31/2021 0743 Gross per 24 hour  Intake 1544.3 ml  Output 400 ml  Net 1144.3 ml   Filed Weights   01/27/21 1741  Weight: 70.9 kg    Exam: General exam: In no acute distress. Respiratory system: Good air movement and clear to auscultation. Cardiovascular system: S1 & S2 heard, RRR. No JVD. Gastrointestinal system: Abdomen is nondistended, soft and nontender.  Extremities: No pedal edema. Skin: No rashes, lesions or ulcers Psychiatry: No insight or judgment of medical condition.   Data Reviewed:    Labs: Basic Metabolic Panel: Recent Labs  Lab 01/27/21 1745 01/29/21 0120 01/30/21 0127 01/31/21 0250  NA 139 136 134* 136  K 4.9 5.6* 4.9 4.5  CL 106 104 102 104  CO2 25 23 20* 25  GLUCOSE 101* 152* 116* 104*  BUN 40* 39* 52* 51*  CREATININE 1.54* 1.92* 2.63* 2.22*  CALCIUM 9.3 8.4* 8.2* 8.3*  MG  --  2.0 2.1 2.1   GFR Estimated Creatinine Clearance: 16.7 mL/min (A) (by C-G formula based on SCr of 2.22 mg/dL (H)). Liver Function Tests: Recent Labs  Lab 01/27/21 1745 01/29/21 0120 01/30/21 0127 01/31/21 0250  AST 28 28 26 30   ALT 11 14 8 8   ALKPHOS 56 44 49 48  BILITOT 0.4 0.6 0.6 0.6  PROT 6.9 5.9* 6.0* 5.7*  ALBUMIN 3.4* 2.9* 3.0* 2.7*   No results for input(s): LIPASE, AMYLASE in the last 168 hours. No results for input(s): AMMONIA in the last 168 hours. Coagulation profile Recent Labs  Lab 01/27/21 1745 01/28/21 0730 01/29/21 0120 01/30/21 0127 01/31/21 0250  INR 1.1 1.2 1.2 1.2 1.3*   COVID-19 Labs  No results for input(s): DDIMER, FERRITIN, LDH, CRP in the last 72 hours.  Lab Results  Component Value Date   SARSCOV2NAA NEGATIVE 01/27/2021   [place] NEGATIVE 10/11/2020   [place] NEGATIVE 10/07/2020    CBC: Recent Labs  Lab 01/27/21 1745 01/29/21 0652 01/30/21 0127 01/31/21 0250  WBC 7.7 13.0* 11.8* 8.7  NEUTROABS  --  10.2* 8.5* 6.5  HGB 11.6* 9.2* 8.9* 7.7*  HCT 37.9 29.8* 28.5* 24.7*  MCV 101.6* 101.0*  101.8* 100.4*  PLT 227 212 215 182   Cardiac Enzymes: No results for input(s): CKTOTAL, CKMB, CKMBINDEX, TROPONINI in the last 168 hours. BNP (last 3 results) No results for input(s): PROBNP in the last 8760 hours. CBG: No results for input(s): GLUCAP in the last 168 hours. D-Dimer: No results for input(s): DDIMER in the last 72 hours. Hgb A1c: No results for input(s): HGBA1C in the last 72 hours. Lipid Profile: No results for input(s): CHOL, HDL, LDLCALC, TRIG, CHOLHDL, LDLDIRECT in the last 72 hours. Thyroid function studies: No results for input(s): TSH, T4TOTAL, T3FREE, THYROIDAB in the last 72 hours.  Invalid input(s): FREET3 Anemia work up: No results for input(s): VITAMINB12, FOLATE, FERRITIN, TIBC, IRON, RETICCTPCT in the last 72 hours. Sepsis Labs: Recent Labs  Lab 01/27/21 1745 01/29/21 0652 01/30/21 0127 01/31/21 0250  WBC 7.7 13.0* 11.8* 8.7   Microbiology Recent Results (from the past 240 hour(s))  Resp Panel by RT-PCR (Flu A&B, Covid) Nasopharyngeal Swab     Status: None   Collection Time: 01/27/21  5:50 PM   Specimen: Nasopharyngeal Swab; Nasopharyngeal(NP) swabs in vial transport medium  Result Value Ref Range Status   SARS Coronavirus 2 by RT PCR NEGATIVE NEGATIVE Final    Comment: (NOTE) SARS-CoV-2 target nucleic acids are NOT DETECTED.  The SARS-CoV-2 RNA is generally detectable in upper respiratory specimens during the acute phase of infection. The lowest concentration of SARS-CoV-2 viral copies this assay can detect is 138 copies/mL. A negative result does not preclude SARS-Cov-2 infection and should not be used as the sole basis for treatment or other patient management decisions. A negative result may occur with  improper specimen collection/handling, submission of specimen other than nasopharyngeal swab, presence of viral mutation(s) within the areas targeted by this assay, and inadequate number of viral copies(<138 copies/mL). A negative  result must be combined with clinical observations, patient history, and epidemiological information. The expected result is Negative.  Fact Sheet for Patients:  EntrepreneurPulse.com.au  Fact Sheet for Healthcare Providers:  IncredibleEmployment.be  This test is no t yet approved or cleared by the [place] FDA and  has been authorized for detection and/or diagnosis of SARS-CoV-2 by FDA under an Emergency Use Authorization (EUA). This EUA will remain  in effect (meaning this test can be used)  for the duration of the COVID-19 declaration under Section 564(b)(1) of the Act, 21 U.S.C.section 360bbb-3(b)(1), unless the authorization is terminated  or revoked sooner.       Influenza A by PCR NEGATIVE NEGATIVE Final   Influenza B by PCR NEGATIVE NEGATIVE Final    Comment: (NOTE) The Xpert Xpress SARS-CoV-2/FLU/RSV plus assay is intended as an aid in the diagnosis of influenza from Nasopharyngeal swab specimens and should not be used as a sole basis for treatment. Nasal washings and aspirates are unacceptable for Xpert Xpress SARS-CoV-2/FLU/RSV testing.  Fact Sheet for Patients: EntrepreneurPulse.com.au  Fact Sheet for Healthcare Providers: IncredibleEmployment.be  This test is not yet approved or cleared by the [place] FDA and has been authorized for detection and/or diagnosis of SARS-CoV-2 by FDA under an Emergency Use Authorization (EUA). This EUA will remain in effect (meaning this test can be used) for the duration of the COVID-19 declaration under Section 564(b)(1) of the Act, 21 U.S.C. section 360bbb-3(b)(1), unless the authorization is terminated or revoked.  Performed at [facility], [place] [address], [place], [place] [address]   Surgical pcr screen     Status: Abnormal   Collection Time: 01/28/21 12:34 PM   Specimen: Nasal Mucosa; Nasal Swab  Result Value Ref Range Status   MRSA,  PCR POSITIVE (A) NEGATIVE Final    Comment: RESULT CALLED TO, READ BACK BY AND VERIFIED WITH:  KAT HARGROVE 1520 ADL     Staphylococcus aureus POSITIVE (A) NEGATIVE Final    Comment: (NOTE) The Xpert SA Assay (FDA approved for NASAL specimens in patients 65 years of age and older), is one component of a comprehensive surveillance program. It is not intended to diagnose infection nor to guide or monitor treatment. Performed at [facility], [place] [address], [place], [place] [address]      Medications:    chlorhexidine  15 mL Mouth/Throat Once   Chlorhexidine Gluconate Cloth  6 each Topical Q0600   docusate sodium  100 mg Oral BID   feeding supplement  237 mL Oral BID BM   latanoprost  1 drop Both Eyes QHS   levothyroxine  175 mcg Oral QAC breakfast   metoprolol succinate  25 mg Oral Daily   multivitamin with minerals  1 tablet Oral Daily   mupirocin ointment  1 application Nasal BID   pantoprazole  40 mg Oral Daily   QUEtiapine  12.5 mg Oral BID   rosuvastatin  10 mg Oral Once per day on Mon Fri   vitamin B-12  1,000 mcg Oral Daily   warfarin  5 mg Oral ONCE-1600   Warfarin - Pharmacist Dosing Inpatient   Does not apply q1600   Continuous Infusions:    LOS: 3 days   Charlynne Cousins  [facility]  01/31/2021, 10:03 AM

## 2021-01-31 NOTE — Progress Notes (Signed)
ANTICOAGULATION CONSULT NOTE - Initial Consult  Pharmacy Consult for warfarin Indication:  hx DVT (12/2020)  Allergies  Allergen Reactions   Contrast Media [Iodinated Diagnostic Agents] Other (See Comments)    Causes patient to pass out   Elemental Sulfur Anaphylaxis   Penicillins Anaphylaxis    Has patient had a PCN reaction causing immediate rash, facial/tongue/throat swelling, SOB or lightheadedness with hypotension: Yes Has patient had a PCN reaction causing severe rash involving mucus membranes or skin necrosis:  Unknown Has patient had a PCN reaction that required hospitalization: No  Has patient had a PCN reaction occurring within the last 10 years: No  If all of the above answers are "NO", then may proceed with Cephalosporin use.    Penicillins Anaphylaxis   Codeine Nausea Only    severe   Cortisone     Break out   Sulfa Antibiotics     Patient Measurements: Height: 5\' 7"  (170.2 cm) Weight: 70.9 kg (156 lb 4.9 oz) IBW/kg (Calculated) : 61.6 Heparin Dosing Weight:   Vital Signs: Temp: 98.7 F (37.1 C) (11/16 0449) Temp Source: Oral (11/16 0449) BP: 138/125 (11/16 0449) Pulse Rate: 99 (11/16 0449)  Labs: Recent Labs    01/29/21 0120 01/29/21 0652 01/29/21 0652 01/30/21 0127 01/31/21 0250  HGB  --  9.2*   < > 8.9* 7.7*  HCT  --  29.8*  --  28.5* 24.7*  PLT  --  212  --  215 182  LABPROT 14.7  --   --  15.0 16.0*  INR 1.2  --   --  1.2 1.3*  CREATININE 1.92*  --   --  2.63* 2.22*   < > = values in this interval not displayed.     Estimated Creatinine Clearance: 16.7 mL/min (A) (by C-G formula based on SCr of 2.22 mg/dL (H)).   Medical History: Past Medical History:  Diagnosis Date   Dementia (HCC)    DVT, lower extremity (HCC)    Hemorrhoids    Hypertension    Hypothyroidism    Thyroid disease     Medications:  Medications Prior to Admission  Medication Sig Dispense Refill Last Dose   ALPRAZolam (XANAX) 0.25 MG tablet Take 1 tablet (0.25 mg  total) by mouth 2 (two) times daily as needed for anxiety (Anxiety or delirium). (Patient taking differently: Take 0.25 mg by mouth every 12 (twelve) hours as needed for anxiety.) 10 tablet 0 01/27/2021   amLODipine (NORVASC) 2.5 MG tablet Take 1 tablet (2.5 mg total) by mouth daily. 30 tablet 0 01/27/2021   citalopram (CELEXA) 10 MG tablet Take 10 mg by mouth See admin instructions. Qd x 7 days   01/27/2021   docusate sodium (COLACE) 100 MG capsule Take 1 capsule (100 mg total) by mouth 2 (two) times daily. 10 capsule 0 01/27/2021   latanoprost (XALATAN) 0.005 % ophthalmic solution Place 1 drop into both eyes at bedtime.   01/26/2021   levothyroxine (SYNTHROID) 175 MCG tablet Take 175 mcg by mouth daily before breakfast.   01/27/2021   LORazepam (ATIVAN) 0.5 MG tablet Take 0.25 mg by mouth in the morning and at bedtime.   01/27/2021   metoprolol succinate (TOPROL-XL) 100 MG 24 hr tablet Take 100 mg by mouth daily.   01/27/2021 at 0800   NON FORMULARY Take 1 Dose by mouth daily. Health shake supplement daily at 3pm      QUEtiapine (SEROQUEL) 25 MG tablet Take 12.5 mg by mouth 2 (two) times daily.  01/27/2021   rosuvastatin (CRESTOR) 10 MG tablet Take 10 mg by mouth See admin instructions. Twice a week on Monday and Friday   01/26/2021   spironolactone (ALDACTONE) 25 MG tablet Take 25 mg by mouth daily.   01/27/2021   vitamin B-12 (CYANOCOBALAMIN) 1000 MCG tablet Take 1,000 mcg by mouth daily.   01/27/2021   warfarin (COUMADIN) 3 MG tablet Take 3 mg by mouth daily.   01/26/2021 at 1700   citalopram (CELEXA) 20 MG tablet Take 20 mg by mouth daily.       Assessment: 87 yo W on warfarin PTA for 12/29/20 LLE age-indeterminate DVT admitted for unwitnessed fall from SNF. Admit INR 1.1. She is s/p left hip trochanteric femoral nailing on 11/13. Pharmacy consulted to resume warfarin.  INR to 1.3 today, still subtherapeutic. Hgb down to 7.7 post-op and on IV fluids. No reported bleeding. Very poor PO  intake per RN, will encourage Ensure supplements.  PTA warfarin: 3 mg/day although INR 1.1 on admit  Goal of Therapy:  INR 2-3 Monitor platelets by anticoagulation protocol: Yes   Plan:  Warfarin 5 mg x1 INR daily, CBC Monitor for s/sx of bleeding  Thank you for involving pharmacy in this patient's care.  Alphia Moh, PharmD, BCPS, BCCP Clinical Pharmacist  Please check AMION for all [facility] Pharmacy phone numbers After 10:00 PM, call Main Pharmacy [phone]

## 2021-02-01 DIAGNOSIS — E440 Moderate protein-calorie malnutrition: Secondary | ICD-10-CM

## 2021-02-01 MED ORDER — SODIUM CHLORIDE 0.9 % IV SOLN
0.2500 mg/h | INTRAVENOUS | Status: DC
  Administered 2021-02-02: 0.25 mg/h via INTRAVENOUS
  Filled 2021-02-01: qty 5

## 2021-02-01 NOTE — Progress Notes (Addendum)
[facility] 6N[facility]) Hospital Liaison Note   Patient hospice eligibility status is approved. [facility] can offer a room today; however, per provider request for d/c is planned for tomorrow. MSW attempted to contact spouse/Richared with no answer as his line was 'busy'. MSW to continue to attempt making contact.Transitions of Care Manager aware hospital liaison will follow up tomorrow. Please do not hesitate to call with questions.    Please call with any questions/concerns.    Thank you for the opportunity to participate in this patient's care.    AddendumGerlene Burdock notified of updates above  Odette Fraction, MSW [facility] Liaison  [phone]

## 2021-02-01 NOTE — Progress Notes (Incomplete)
Pt has been resting comfortable. Pt is not arousable at this time. No family today.

## 2021-02-01 NOTE — Progress Notes (Addendum)
[facility] PROGRESS NOTE    Progress Note  [name]  [id number] DOB: June 22, 1929 DOA: 01/27/2021 PCP: Pcp, No     Brief Narrative:   [name] is an 87 y.o. female past medical history of dementia, essential hypertension hypothyroidism brought into the ER after an unwitnessed fall living facility was found to have a left hip fracture.  Assessment/Plan:   Acute discontinue statins.  Closed left hip fracture: Orthopedic surgery was consulted status post surgical intervention on 01/29/2019 Family wants to move towards comfort care.  Advance dementia with behavioral disturbances: Minimize narcotics and benzodiazepine. She is currently on Seroquel at night Haldol as needed.  Acute kidney injury on chronic kidney disease stage IIIb: Creatinine 1.5, there was a mild rise in her creatinine she was started on IV fluids. And her creatinine is stabilized this morning at 2.2. Continue comfort measures, family has requested [facility].  They currently did not have a bed available.  Essential hypertension: Discontinue all antihypertensive medication.  History of left bundle branch block: Noted.  Hypo-thyroidism:  continue Synthroid.  Chronic microcytic anemia: She has been typed and screened, her hemoglobin, is now 7.7 her hemoglobin has been slowly drift down. Spoke with the husband they would like to move towards comfort care. Pain seems to be control, patient resting comfortable, decrease IV dilaudid.  Dyslipidemia: Dicntinue statin.  Goals of care/ethics: Family comfortable with decision she is currently on IV hydromorphone and comfortable. [facility]   DVT prophylaxis: SCD Family Communication:Daughter Status is: Inpatient  Remains inpatient appropriate because: Acute left hip fracture now with acute kidney injury and acute blood loss anemia   Code Status:     Code Status Orders  (From admission,  onward)           Start     Ordered   01/28/21 0629  Do not attempt resuscitation (DNR)  Continuous       Question Answer Comment  In the event of cardiac or respiratory ARREST Do not call a "code blue"   In the event of cardiac or respiratory ARREST Do not perform Intubation, CPR, defibrillation or ACLS   In the event of cardiac or respiratory ARREST Use medication by any route, position, wound care, and other measures to relive pain and suffering. May use oxygen, suction and manual treatment of airway obstruction as needed for comfort.      01/28/21 0628           Code Status History     Date Active Date Inactive Code Status Order ID Comments User Context   01/28/2021 0341 01/28/2021 0628 Full Code [id number]  Rise Patience, MD ED   10/07/2020 1405 10/12/2020 1920 Full Code [id number]  Norval Morton, MD ED   05/09/2015 1126 05/10/2015 1506 Full Code [id number]  Reyne Dumas, MD ED      Advance Directive Documentation    [place] Most Recent Value  Type of Advance Directive Out of facility DNR (pink MOST or yellow form)  Pre-existing out of facility DNR order (yellow form or pink MOST form) --  "MOST" Form in Place? --         IV Access:   Peripheral IV   Procedures and diagnostic studies:   US RENAL  Result Date: 01/30/2021 CLINICAL DATA:  Acute kidney injury EXAM: RENAL / URINARY TRACT ULTRASOUND COMPLETE COMPARISON:  01/27/2021 CT FINDINGS: Right Kidney: Renal measurements: 8.9 x 3.7 x 4.2 cm = volume: 71  mL. Increased renal cortical echogenicity. 4.0 cm exophytic simple cyst emanating from the interpolar region. No shadowing stone or hydronephrosis visualized. Left Kidney: Renal measurements: 9.2 x 5.1 x 5.0 cm = volume: 122 mL. Increased renal cortical echogenicity. 4.9 cm simple cyst at the interpolar region of the left kidney. No shadowing stone or hydronephrosis visualized. Bladder: Appears normal for degree of bladder distention. Other: Several  hepatic cysts are incidentally noted. IMPRESSION: 1. Negative for obstructive uropathy. 2. Echogenic kidneys suggest sequela of medical renal disease. Electronically Signed   By: Davina Poke D.O.   On: 01/30/2021 10:41     Medical Consultants:   None.   Subjective:    [name] sleepy this morning  Objective:    Vitals:   01/31/21 0449 01/31/21 0900 01/31/21 2116 02/01/21 0554  BP: (!) 138/125 (!) 173/91 (!) 127/54 (!) 112/38  Pulse: 99 90 73 79  Resp:  18 18 15   Temp: 98.7 F (37.1 C) 98.1 F (36.7 C) 98.2 F (36.8 C) 99 F (37.2 C)  TempSrc: Oral Axillary Axillary   SpO2: 94% 96% 94% 97%  Weight:      Height:       SpO2: 97 %   Intake/Output Summary (Last 24 hours) at 02/01/2021 0937 Last data filed at 02/01/2021 0853 Gross per 24 hour  Intake 334.26 ml  Output 300 ml  Net 34.26 ml    Filed Weights   01/27/21 1741  Weight: 70.9 kg    Exam: General exam: In no acute distress. Respiratory system: Good air movement and clear to auscultation. Cardiovascular system: S1 & S2 heard, RRR. No JVD. Gastrointestinal system: Abdomen is nondistended, soft and nontender.  Extremities: No pedal edema. Skin: No rashes, lesions or ulcers Psychiatry: Judgement and insight appear normal. Mood & affect appropriate.   Data Reviewed:    Labs: Basic Metabolic Panel: Recent Labs  Lab 01/27/21 1745 01/29/21 0120 01/30/21 0127 01/31/21 0250  NA 139 136 134* 136  K 4.9 5.6* 4.9 4.5  CL 106 104 102 104  CO2 25 23 20* 25  GLUCOSE 101* 152* 116* 104*  BUN 40* 39* 52* 51*  CREATININE 1.54* 1.92* 2.63* 2.22*  CALCIUM 9.3 8.4* 8.2* 8.3*  MG  --  2.0 2.1 2.1    GFR Estimated Creatinine Clearance: 16.7 mL/min (A) (by C-G formula based on SCr of 2.22 mg/dL (H)). Liver Function Tests: Recent Labs  Lab 01/27/21 1745 01/29/21 0120 01/30/21 0127 01/31/21 0250  AST 28 28 26 30   ALT 11 14 8 8   ALKPHOS 56 44 49 48  BILITOT 0.4 0.6 0.6 0.6  PROT 6.9  5.9* 6.0* 5.7*  ALBUMIN 3.4* 2.9* 3.0* 2.7*    No results for input(s): LIPASE, AMYLASE in the last 168 hours. No results for input(s): AMMONIA in the last 168 hours. Coagulation profile Recent Labs  Lab 01/27/21 1745 01/28/21 0730 01/29/21 0120 01/30/21 0127 01/31/21 0250  INR 1.1 1.2 1.2 1.2 1.3*    COVID-19 Labs  No results for input(s): DDIMER, FERRITIN, LDH, CRP in the last 72 hours.  Lab Results  Component Value Date   SARSCOV2NAA NEGATIVE 01/27/2021   [place] NEGATIVE 10/11/2020   [place] NEGATIVE 10/07/2020    CBC: Recent Labs  Lab 01/27/21 1745 01/29/21 0652 01/30/21 0127 01/31/21 0250  WBC 7.7 13.0* 11.8* 8.7  NEUTROABS  --  10.2* 8.5* 6.5  HGB 11.6* 9.2* 8.9* 7.7*  HCT 37.9 29.8* 28.5* 24.7*  MCV 101.6* 101.0* 101.8* 100.4*  PLT 227  212 215 182    Cardiac Enzymes: No results for input(s): CKTOTAL, CKMB, CKMBINDEX, TROPONINI in the last 168 hours. BNP (last 3 results) No results for input(s): PROBNP in the last 8760 hours. CBG: No results for input(s): GLUCAP in the last 168 hours. D-Dimer: No results for input(s): DDIMER in the last 72 hours. Hgb A1c: No results for input(s): HGBA1C in the last 72 hours. Lipid Profile: No results for input(s): CHOL, HDL, LDLCALC, TRIG, CHOLHDL, LDLDIRECT in the last 72 hours. Thyroid function studies: No results for input(s): TSH, T4TOTAL, T3FREE, THYROIDAB in the last 72 hours.  Invalid input(s): FREET3 Anemia work up: No results for input(s): VITAMINB12, FOLATE, FERRITIN, TIBC, IRON, RETICCTPCT in the last 72 hours. Sepsis Labs: Recent Labs  Lab 01/27/21 1745 01/29/21 0652 01/30/21 0127 01/31/21 0250  WBC 7.7 13.0* 11.8* 8.7    Microbiology Recent Results (from the past 240 hour(s))  Resp Panel by RT-PCR (Flu A&B, Covid) Nasopharyngeal Swab     Status: None   Collection Time: 01/27/21  5:50 PM   Specimen: Nasopharyngeal Swab; Nasopharyngeal(NP) swabs in vial transport medium  Result  Value Ref Range Status   SARS Coronavirus 2 by RT PCR NEGATIVE NEGATIVE Final    Comment: (NOTE) SARS-CoV-2 target nucleic acids are NOT DETECTED.  The SARS-CoV-2 RNA is generally detectable in upper respiratory specimens during the acute phase of infection. The lowest concentration of SARS-CoV-2 viral copies this assay can detect is 138 copies/mL. A negative result does not preclude SARS-Cov-2 infection and should not be used as the sole basis for treatment or other patient management decisions. A negative result may occur with  improper specimen collection/handling, submission of specimen other than nasopharyngeal swab, presence of viral mutation(s) within the areas targeted by this assay, and inadequate number of viral copies(<138 copies/mL). A negative result must be combined with clinical observations, patient history, and epidemiological information. The expected result is Negative.  Fact Sheet for Patients:  BloggerCourse.com  Fact Sheet for Healthcare Providers:  SeriousBroker.it  This test is no t yet approved or cleared by the [place] FDA and  has been authorized for detection and/or diagnosis of SARS-CoV-2 by FDA under an Emergency Use Authorization (EUA). This EUA will remain  in effect (meaning this test can be used) for the duration of the COVID-19 declaration under Section 564(b)(1) of the Act, 21 U.S.C.section 360bbb-3(b)(1), unless the authorization is terminated  or revoked sooner.       Influenza A by PCR NEGATIVE NEGATIVE Final   Influenza B by PCR NEGATIVE NEGATIVE Final    Comment: (NOTE) The Xpert Xpress SARS-CoV-2/FLU/RSV plus assay is intended as an aid in the diagnosis of influenza from Nasopharyngeal swab specimens and should not be used as a sole basis for treatment. Nasal washings and aspirates are unacceptable for Xpert Xpress SARS-CoV-2/FLU/RSV testing.  Fact Sheet for  Patients: BloggerCourse.com  Fact Sheet for Healthcare Providers: SeriousBroker.it  This test is not yet approved or cleared by the [place] FDA and has been authorized for detection and/or diagnosis of SARS-CoV-2 by FDA under an Emergency Use Authorization (EUA). This EUA will remain in effect (meaning this test can be used) for the duration of the COVID-19 declaration under Section 564(b)(1) of the Act, 21 U.S.C. section 360bbb-3(b)(1), unless the authorization is terminated or revoked.  Performed at [facility], [address] [place], [place] [address]   Surgical pcr screen     Status: Abnormal   Collection Time: 01/28/21 12:34 PM  Specimen: Nasal Mucosa; Nasal Swab  Result Value Ref Range Status   MRSA, PCR POSITIVE (A) NEGATIVE Final    Comment: RESULT CALLED TO, READ BACK BY AND VERIFIED WITH:  KAT HARGROVE 1520 ADL     Staphylococcus aureus POSITIVE (A) NEGATIVE Final    Comment: (NOTE) The Xpert SA Assay (FDA approved for NASAL specimens in patients 93 years of age and older), is one component of a comprehensive surveillance program. It is not intended to diagnose infection nor to guide or monitor treatment. Performed at [facility], [place] [address] [place] [address]      Medications:     Continuous Infusions:  sodium chloride 10 mL/hr (01/31/21 1148)   HYDROmorphone 0.5 mg/hr (01/31/21 1152)      LOS: 4 days   Charlynne Cousins  [facility]  02/01/2021, 9:37 AM

## 2021-02-01 NOTE — Progress Notes (Signed)
Physical Therapy Discharge Patient Details Name: [name] MRN: [id number] DOB: Mar 17, 1930 Today's Date: 02/01/2021 Time:  -     Patient discharged from PT services secondary to medical decline - will need to re-order PT to resume therapy services.  Please see latest therapy progress note for current level of functioning and progress toward goals.    Progress and discharge plan discussed with patient and/or caregiver: Patient unable to participate in discharge planning and no caregivers available  GP     [name] 02/01/2021, 12:07 PM  Dequann Vandervelden M,PT Acute Rehab Services [phone] (pager)

## 2021-02-01 NOTE — TOC Progression Note (Signed)
Transition of Care [facility]) - Progression Note    Patient Details  Name: [name] MRN: [id number] Date of Birth: [birth year]  Transition of [facility]) CM/SW Contact  Nadene Rubins Adria Devon, RN Phone Number: 02/01/2021, 3:20 PM  Clinical Narrative:     NCM spoke to patient's husband Richard. Richard plans to go to [employer] to do paperwork tomorrow at 10 am. Once paperwork completed NCM will arrange ambulance transport.   Expected Discharge Plan: Hospice Medical Facility    Expected Discharge Plan and Services Expected Discharge Plan: Hospice Medical Facility   Discharge Planning Services: CM Consult Post Acute Care Choice: Hospice Living arrangements for the past 2 months: Apartment                 DME Arranged: N/A DME Agency: NA       HH Arranged: NA           Social Determinants of Health (SDOH) Interventions    Readmission Risk Interventions No flowsheet data found.

## 2021-02-01 NOTE — Progress Notes (Signed)
PT Cancellation Note  Patient Details Name: CHIANNA SPIRITO MRN: [id number] DOB: May 17, 1929   Cancelled Treatment:    Reason Eval/Treat Not Completed: Medical issues which prohibited therapy;Other (comment) (Pt going to [employer]. Will sign off.)   [name] 02/01/2021, 12:06 PM Diahann Guajardo M,PT Acute Rehab Services [phone] (pager)

## 2021-02-02 MED ORDER — SODIUM CHLORIDE 0.9 % IV SOLN: 0.5000 mg/h | INTRAVENOUS | 0 refills | Status: AC

## 2021-02-02 MED ORDER — DIPHENHYDRAMINE HCL 50 MG/ML IJ SOLN
12.5000 mg | INTRAMUSCULAR | 0 refills | Status: AC | PRN
Start: 2021-02-02 — End: ?

## 2021-02-02 MED ORDER — LORAZEPAM 2 MG/ML PO CONC: 1.0000 mg | ORAL | 0 refills | Status: AC | PRN

## 2021-02-02 NOTE — Progress Notes (Signed)
Patient ID: [name], female   DOB: April 08, 1929, 87 y.o.   MRN: [id number]  100 mg of Dilaudid wasted in [employer], [employer] with [employer].  Lidia Collum, RN

## 2021-02-02 NOTE — Progress Notes (Signed)
[facility]) Hospital Liaison Note   PTAR notified of patient D/C and transport arranged by TOC/Heather. Please send signed DNR form with patient and RN call report to [phone]     Odette Fraction, MSW [facility] Liaison [phone]

## 2021-02-02 NOTE — Progress Notes (Signed)
Hydromorphone 50 mg in sodium chloride 50 ml was wasted in the sink with Jewel Rimando, RN as Instructed by Ilda Basset. Medication had expired and so needed to be wasted and new one hanged.

## 2021-02-02 NOTE — Discharge Summary (Signed)
Physician Discharge Summary  [name] [id number] DOB: 18-Aug-1929 DOA: 01/27/2021  PCP: Merryl Hacker, No  Admit date: 01/27/2021 Discharge date: 02/02/2021  Admitted From: Memory care unit Disposition:  Residential hosipice facility  Recommendations for Outpatient Follow-up:  She will og to residential hospice facility  Home Health:No Equipment/Devices:none  Discharge Condition:Stable CODE STATUS:DNR Diet recommendation: Heart Healthy  Brief/Interim Summary: 87 y.o. female past medical history of dementia, essential hypertension hypothyroidism brought into the ER after an unwitnessed fall living facility was found to have a left hip fracture  Discharge Diagnoses:  Principal Problem:   Closed left hip fracture ([place]) Active Problems:   Dementia ([place])   CKD (chronic kidney disease), stage III ([place])   Hypertensive urgency   Hip fracture ([place])   Malnutrition of moderate degree  Acute closed left hip fracture: Orthopedic surgery was consulted and pronounced injury on the ventral 01/28/2021. The patient did not improve with Complaining of pain not tolerating her diet. After having a long conversation with the family decided to move towards comfort care.  Benzo mention with behavioral disturbances: Minimize narcotics and benzodiazepines, she was On her Seroquel and house.  Acute kidney injury on chronic kidney disease stage IIIb: With a baseline creatinine 1.5 she was started on IV fluids as her creatinine increased to 2.2. After having a long conversation with her husband he relates she is not had a good quality of life, she is not eating here in the hospital and moaning and groaning.  She had expressed to him in the past that if she gets to the point where she cannot enjoy life and is not eating and not able to go back home to let nature take its course. He decided to move towards comfort care as for all labs to be discontinued and all medications not related to comfort be  discontinued. She was started on IV Dilaudid to control her pain and she remained stable comfortable he was at bed and comfortable with decision.  Essential hypertension: All antihypertensive medication were discontinued.  Chronic microcytic anemia: Dyslipidemia History of left bundle branch block  Goals of care: As mentioned above her husband decided to move towards comfort care he chose residential hospice facility [facility].   She has not eaten anything for the last 3 days I expect her life expectancy to be less than 2 weeks. She will go on IV Dilaudid gtt which is controlling her pain.Marland Kitchen  Discharge Instructions  Discharge Instructions     Diet - low sodium heart healthy   Complete by: As directed    Increase activity slowly   Complete by: As directed    No wound care   Complete by: As directed       Allergies as of 02/02/2021       Reactions   Contrast Media [iodinated Diagnostic Agents] Other (See Comments)   Causes patient to pass out   Elemental Sulfur Anaphylaxis   Penicillins Anaphylaxis   Has patient had a PCN reaction causing immediate rash, facial/tongue/throat swelling, SOB or lightheadedness with hypotension: Yes Has patient had a PCN reaction causing severe rash involving mucus membranes or skin necrosis:  Unknown Has patient had a PCN reaction that required hospitalization: No  Has patient had a PCN reaction occurring within the last 10 years: No  If all of the above answers are "NO", then may proceed with Cephalosporin use.   Penicillins Anaphylaxis   Codeine Nausea Only   severe   Cortisone    Break out  Sulfa Antibiotics         Medication List     STOP taking these medications    ALPRAZolam 0.25 MG tablet Commonly known as: XANAX   amLODipine 2.5 MG tablet Commonly known as: NORVASC   citalopram 10 MG tablet Commonly known as: CELEXA   citalopram 20 MG tablet Commonly known as: CELEXA   docusate sodium 100 MG capsule Commonly  known as: COLACE   latanoprost 0.005 % ophthalmic solution Commonly known as: XALATAN   levothyroxine 175 MCG tablet Commonly known as: SYNTHROID   LORazepam 0.5 MG tablet Commonly known as: ATIVAN Replaced by: LORazepam 2 MG/ML concentrated solution   metoprolol succinate 100 MG 24 hr tablet Commonly known as: TOPROL-XL   NON FORMULARY   QUEtiapine 25 MG tablet Commonly known as: SEROQUEL   rosuvastatin 10 MG tablet Commonly known as: CRESTOR   spironolactone 25 MG tablet Commonly known as: ALDACTONE   vitamin B-12 1000 MCG tablet Commonly known as: CYANOCOBALAMIN   warfarin 3 MG tablet Commonly known as: COUMADIN       TAKE these medications    diphenhydrAMINE 50 MG/ML injection Commonly known as: BENADRYL Inject 0.25 mLs (12.5 mg total) into the vein every 4 (four) hours as needed for itching.   HYDROmorphone 50 mg in sodium chloride 0.9 % 95 mL Inject 0.5 mg/hr into the vein continuous.   LORazepam 2 MG/ML concentrated solution Commonly known as: ATIVAN Place 0.5 mLs (1 mg total) under the tongue every 4 (four) hours as needed for anxiety. Replaces: LORazepam 0.5 MG tablet        Follow-up Information     Marchia Bond, MD. Schedule an appointment as soon as possible for a visit in 2 week(s).   Specialty: Orthopedic Surgery Contact information: [address] [place] [address] [phone]                Allergies  Allergen Reactions   Contrast Media [Iodinated Diagnostic Agents] Other (See Comments)    Causes patient to pass out   Elemental Sulfur Anaphylaxis   Penicillins Anaphylaxis    Has patient had a PCN reaction causing immediate rash, facial/tongue/throat swelling, SOB or lightheadedness with hypotension: Yes Has patient had a PCN reaction causing severe rash involving mucus membranes or skin necrosis:  Unknown Has patient had a PCN reaction that required hospitalization: No  Has patient had a PCN reaction  occurring within the last 10 years: No  If all of the above answers are "NO", then may proceed with Cephalosporin use.    Penicillins Anaphylaxis   Codeine Nausea Only    severe   Cortisone     Break out   Sulfa Antibiotics     Consultations: Orthopedic surgery  Procedures/Studies: CT Head Wo Contrast  Result Date: 01/28/2021 CLINICAL DATA:  Fall. EXAM: CT HEAD WITHOUT CONTRAST CT CERVICAL SPINE WITHOUT CONTRAST TECHNIQUE: Multidetector CT imaging of the head and cervical spine was performed following the standard protocol without intravenous contrast. Multiplanar CT image reconstructions of the cervical spine were also generated. COMPARISON:  CT head and cervical spine 01/27/2021. FINDINGS: CT HEAD FINDINGS Brain: No evidence of acute infarction, hemorrhage, hydrocephalus, extra-axial collection or mass lesion/mass effect. Again seen is mild diffuse atrophy. There is stable mild periventricular white matter hypodensity, likely chronic small vessel ischemic change. Vascular: Atherosclerotic calcifications are present within the cavernous internal carotid arteries. Skull: Normal. Negative for fracture or focal lesion. Sinuses/Orbits: No acute finding. Other: None. CT CERVICAL SPINE FINDINGS  Alignment: There is stable grade 1 anterolisthesis at C3-C4 and C4-C5 which is favored as degenerative. Spinal alignment is otherwise within normal limits. Skull base and vertebrae: No acute fracture. No primary bone lesion or focal pathologic process. There is stable mild chronic compression deformity of T1. Soft tissues and spinal canal: No prevertebral fluid or swelling. No visible canal hematoma. Disc levels: Again seen is moderate severe disc space narrowing throughout the cervical spine with endplate osteophyte formation most significant at C5-C6. At C5-C6 there is some mild bilateral neural foraminal stenosis, unchanged. There is no significant central canal stenosis identified at any level. Upper chest:  Negative. Other: None. IMPRESSION: No acute intracranial process. No acute fracture or traumatic subluxation of the cervical spine. Electronically Signed   By: Ronney Asters M.D.   On: 01/28/2021 00:01   CT HEAD WO CONTRAST  Result Date: 01/27/2021 CLINICAL DATA:  Head trauma, minor (Age >= 65y); Neck trauma (Age >= 65y) EXAM: CT HEAD WITHOUT CONTRAST CT CERVICAL SPINE WITHOUT CONTRAST TECHNIQUE: Multidetector CT imaging of the head and cervical spine was performed following the standard protocol without intravenous contrast. Multiplanar CT image reconstructions of the cervical spine were also generated. COMPARISON:  12/29/2020 FINDINGS: CT HEAD FINDINGS Brain: Normal anatomic configuration. Parenchymal volume loss is commensurate with the patient's age. Mild periventricular white matter changes are present likely reflecting the sequela of small vessel ischemia. No abnormal intra or extra-axial mass lesion or fluid collection. No abnormal mass effect or midline shift. No evidence of acute intracranial hemorrhage or infarct. Ventricular size is normal. Cerebellum unremarkable. Vascular: No asymmetric hyperdense vasculature at the skull base. Advanced vascular calcifications are noted within the carotid siphons and terminal vertebral arteries. Skull: Intact Sinuses/Orbits: Paranasal sinuses are clear. The ocular lenses have been removed. Orbits are otherwise unremarkable. Other: Mastoid air cells and middle ear cavities are clear. CT CERVICAL SPINE FINDINGS Alignment: There is straightening of the cervical spine, unchanged from prior examination. There is stable anterolisthesis of C3 upon C4 and C4 upon C5 of approximately 2-3 mm. Stable retrolisthesis of C5 upon C6 of 3 mm. Skull base and vertebrae: Craniocervical alignment is normal. The atlantodental interval is not widened. There are advanced degenerative changes involving the atlantodental articulation as well as the right atlantoaxial articulation. Moderate  degenerative arthritis noted involving the occipitoatlantal articulation bilaterally as well as the left atlantoaxial articulation. No acute fracture of the cervical spine. Remote superior endplate fracture of T1 with mild loss of height is unchanged. There is again noted ankylosis of the right C2-3, C4-5, and C7-T1 facets as well as the left C2-3 and C4-5 facet joints. Soft tissues and spinal canal: No prevertebral fluid or swelling. No visible canal hematoma. No significant canal stenosis. Disc levels: There is marked intervertebral disc space narrowing and endplate remodeling throughout the cervical spine, most severe at C4-C7 in keeping with changes of moderate to severe degenerative disc disease. The prevertebral soft tissues are not thickened on sagittal reformats. Review of the axial images demonstrates multilevel advanced uncovertebral and facet arthrosis resulting in mild-to-moderate left neuroforaminal narrowing at C3-4, mild right and moderate left neuroforaminal narrowing at C5-6. Upper chest: Asymmetric right apical parenchymal scarring. Moderate atherosclerotic calcification within the right carotid bifurcation. Other: None IMPRESSION: No acute intracranial injury.  No calvarial fracture. No acute fracture or listhesis of the cervical spine. Electronically Signed   By: Fidela Salisbury M.D.   On: 01/27/2021 19:51   CT Cervical Spine Wo Contrast  Result Date: 01/28/2021 CLINICAL DATA:  Fall. EXAM: CT HEAD WITHOUT CONTRAST CT CERVICAL SPINE WITHOUT CONTRAST TECHNIQUE: Multidetector CT imaging of the head and cervical spine was performed following the standard protocol without intravenous contrast. Multiplanar CT image reconstructions of the cervical spine were also generated. COMPARISON:  CT head and cervical spine 01/27/2021. FINDINGS: CT HEAD FINDINGS Brain: No evidence of acute infarction, hemorrhage, hydrocephalus, extra-axial collection or mass lesion/mass effect. Again seen is mild diffuse  atrophy. There is stable mild periventricular white matter hypodensity, likely chronic small vessel ischemic change. Vascular: Atherosclerotic calcifications are present within the cavernous internal carotid arteries. Skull: Normal. Negative for fracture or focal lesion. Sinuses/Orbits: No acute finding. Other: None. CT CERVICAL SPINE FINDINGS Alignment: There is stable grade 1 anterolisthesis at C3-C4 and C4-C5 which is favored as degenerative. Spinal alignment is otherwise within normal limits. Skull base and vertebrae: No acute fracture. No primary bone lesion or focal pathologic process. There is stable mild chronic compression deformity of T1. Soft tissues and spinal canal: No prevertebral fluid or swelling. No visible canal hematoma. Disc levels: Again seen is moderate severe disc space narrowing throughout the cervical spine with endplate osteophyte formation most significant at C5-C6. At C5-C6 there is some mild bilateral neural foraminal stenosis, unchanged. There is no significant central canal stenosis identified at any level. Upper chest: Negative. Other: None. IMPRESSION: No acute intracranial process. No acute fracture or traumatic subluxation of the cervical spine. Electronically Signed   By: Ronney Asters M.D.   On: 01/28/2021 00:01   CT CERVICAL SPINE WO CONTRAST  Result Date: 01/27/2021 CLINICAL DATA:  Head trauma, minor (Age >= 65y); Neck trauma (Age >= 65y) EXAM: CT HEAD WITHOUT CONTRAST CT CERVICAL SPINE WITHOUT CONTRAST TECHNIQUE: Multidetector CT imaging of the head and cervical spine was performed following the standard protocol without intravenous contrast. Multiplanar CT image reconstructions of the cervical spine were also generated. COMPARISON:  12/29/2020 FINDINGS: CT HEAD FINDINGS Brain: Normal anatomic configuration. Parenchymal volume loss is commensurate with the patient's age. Mild periventricular white matter changes are present likely reflecting the sequela of small vessel  ischemia. No abnormal intra or extra-axial mass lesion or fluid collection. No abnormal mass effect or midline shift. No evidence of acute intracranial hemorrhage or infarct. Ventricular size is normal. Cerebellum unremarkable. Vascular: No asymmetric hyperdense vasculature at the skull base. Advanced vascular calcifications are noted within the carotid siphons and terminal vertebral arteries. Skull: Intact Sinuses/Orbits: Paranasal sinuses are clear. The ocular lenses have been removed. Orbits are otherwise unremarkable. Other: Mastoid air cells and middle ear cavities are clear. CT CERVICAL SPINE FINDINGS Alignment: There is straightening of the cervical spine, unchanged from prior examination. There is stable anterolisthesis of C3 upon C4 and C4 upon C5 of approximately 2-3 mm. Stable retrolisthesis of C5 upon C6 of 3 mm. Skull base and vertebrae: Craniocervical alignment is normal. The atlantodental interval is not widened. There are advanced degenerative changes involving the atlantodental articulation as well as the right atlantoaxial articulation. Moderate degenerative arthritis noted involving the occipitoatlantal articulation bilaterally as well as the left atlantoaxial articulation. No acute fracture of the cervical spine. Remote superior endplate fracture of T1 with mild loss of height is unchanged. There is again noted ankylosis of the right C2-3, C4-5, and C7-T1 facets as well as the left C2-3 and C4-5 facet joints. Soft tissues and spinal canal: No prevertebral fluid or swelling. No visible canal hematoma. No significant canal stenosis. Disc levels: There is marked intervertebral disc space narrowing and endplate remodeling throughout the cervical  spine, most severe at C4-C7 in keeping with changes of moderate to severe degenerative disc disease. The prevertebral soft tissues are not thickened on sagittal reformats. Review of the axial images demonstrates multilevel advanced uncovertebral and facet  arthrosis resulting in mild-to-moderate left neuroforaminal narrowing at C3-4, mild right and moderate left neuroforaminal narrowing at C5-6. Upper chest: Asymmetric right apical parenchymal scarring. Moderate atherosclerotic calcification within the right carotid bifurcation. Other: None IMPRESSION: No acute intracranial injury.  No calvarial fracture. No acute fracture or listhesis of the cervical spine. Electronically Signed   By: Fidela Salisbury M.D.   On: 01/27/2021 19:51   US RENAL  Result Date: 01/30/2021 CLINICAL DATA:  Acute kidney injury EXAM: RENAL / URINARY TRACT ULTRASOUND COMPLETE COMPARISON:  01/27/2021 CT FINDINGS: Right Kidney: Renal measurements: 8.9 x 3.7 x 4.2 cm = volume: 71 mL. Increased renal cortical echogenicity. 4.0 cm exophytic simple cyst emanating from the interpolar region. No shadowing stone or hydronephrosis visualized. Left Kidney: Renal measurements: 9.2 x 5.1 x 5.0 cm = volume: 122 mL. Increased renal cortical echogenicity. 4.9 cm simple cyst at the interpolar region of the left kidney. No shadowing stone or hydronephrosis visualized. Bladder: Appears normal for degree of bladder distention. Other: Several hepatic cysts are incidentally noted. IMPRESSION: 1. Negative for obstructive uropathy. 2. Echogenic kidneys suggest sequela of medical renal disease. Electronically Signed   By: Davina Poke D.O.   On: 01/30/2021 10:41   Pelvis Portable  Result Date: 01/28/2021 CLINICAL DATA:  Status post ORIF of the LEFT femur EXAM: PORTABLE PELVIS 1 VIEWS COMPARISON:  January 28, 2021 FINDINGS: Status post ORIF of the LEFT femur. Orthopedic hardware is intact and without periprosthetic fracture or lucency. Soft tissue air. Persistent distraction of the lesser trochanter. Fracture fragments are in improved near anatomic alignment. Osteopenia. Pelvic phleboliths. IMPRESSION: Expected postsurgical appearance status post ORIF of the LEFT femur. Electronically Signed   By: Valentino Saxon M.D.   On: 01/28/2021 16:47   CT T-SPINE NO CHARGE  Result Date: 01/28/2021 CLINICAL DATA:  Fall EXAM: CT Thoracic and Lumbar spine without contrast TECHNIQUE: Multiplanar CT images of the thoracic and lumbar spine were reconstructed from contemporary CT of the Chest, Abdomen, and Pelvis CONTRAST:  None COMPARISON:  None. FINDINGS: CT THORACIC SPINE FINDINGS Alignment: Normal thoracic kyphosis. Vertebrae: No acute fracture or focal pathologic process. Paraspinal and other soft tissues: Better evaluated on dedicated CT chest. Disc levels: Mild degenerative changes of the mid/lower thoracic spine. Superior endplate Schmorl's node deformity at T11. Spinal canal is patent. CT LUMBAR SPINE FINDINGS Segmentation: 5 lumbar type vertebral bodies. Alignment: Normal lumbar lordosis. Vertebrae: No acute fracture or focal pathologic process. Paraspinal and other soft tissues: Better evaluated on dedicated CT abdomen/pelvis. Disc levels: Mild multilevel degenerative changes, most prominent at L3-4. Spinal canal is patent. IMPRESSION: No evidence of traumatic injury to the thoracolumbar spine. Mild multilevel degenerative changes, as above. Electronically Signed   By: Julian Hy M.D.   On: 01/28/2021 00:14   CT L-SPINE NO CHARGE  Result Date: 01/28/2021 CLINICAL DATA:  Fall EXAM: CT Thoracic and Lumbar spine without contrast TECHNIQUE: Multiplanar CT images of the thoracic and lumbar spine were reconstructed from contemporary CT of the Chest, Abdomen, and Pelvis CONTRAST:  None COMPARISON:  None. FINDINGS: CT THORACIC SPINE FINDINGS Alignment: Normal thoracic kyphosis. Vertebrae: No acute fracture or focal pathologic process. Paraspinal and other soft tissues: Better evaluated on dedicated CT chest. Disc levels: Mild degenerative changes of the mid/lower thoracic spine. Superior  endplate Schmorl's node deformity at T11. Spinal canal is patent. CT LUMBAR SPINE FINDINGS Segmentation: 5 lumbar type vertebral  bodies. Alignment: Normal lumbar lordosis. Vertebrae: No acute fracture or focal pathologic process. Paraspinal and other soft tissues: Better evaluated on dedicated CT abdomen/pelvis. Disc levels: Mild multilevel degenerative changes, most prominent at L3-4. Spinal canal is patent. IMPRESSION: No evidence of traumatic injury to the thoracolumbar spine. Mild multilevel degenerative changes, as above. Electronically Signed   By: Julian Hy M.D.   On: 01/28/2021 00:14   DG Chest Port 1 View  Result Date: 01/29/2021 CLINICAL DATA:  Shortness of breath EXAM: PORTABLE CHEST 1 VIEW COMPARISON:  01/27/2021 FINDINGS: Unchanged mildly enlarged cardiac and normal mediastinal contours when accounting differences in positioning. Aortic atherosclerosis. Evaluation of the lung basis is somewhat limited by field of view; however no definite pleural effusion is seen. No focal pulmonary opacity. No acute osseous abnormality. IMPRESSION: No acute cardiopulmonary process. Electronically Signed   By: Merilyn Baba M.D.   On: 01/29/2021 11:21   DG Chest Port 1 View  Result Date: 01/27/2021 CLINICAL DATA:  Post fall with left hip pain. EXAM: PORTABLE CHEST 1 VIEW COMPARISON:  12/29/2020 FINDINGS: Stable upper normal heart size. Unchanged mediastinal contours with aortic atherosclerosis. Mitral annulus calcifications. No pneumothorax, pleural effusion, or focal airspace disease. No pulmonary edema. The bones are under mineralized without acute osseous abnormalities. IMPRESSION: No acute chest findings. Electronically Signed   By: Keith Rake M.D.   On: 01/27/2021 18:28   DG Knee Left Port  Result Date: 01/27/2021 CLINICAL DATA:  Fall. EXAM: LEFT FEMUR PORTABLE 1 VIEW; PORTABLE LEFT KNEE - 1-2 VIEW COMPARISON:  The left knee x-ray 12/31/2015. FINDINGS: Left knee: Left knee arthroplasty appears in anatomic alignment. There is no evidence for hardware loosening or acute fracture. There is no significant joint  effusion. Vascular calcifications are seen in the soft tissues. Left femur: No acute fracture or dislocation identified. There are vascular calcifications in the soft tissues. IMPRESSION: 1. No acute fracture or dislocation of the left femur or left knee. 2. Left knee arthroplasty in anatomic alignment. Electronically Signed   By: Ronney Asters M.D.   On: 01/27/2021 21:19   DG C-Arm 1-60 Min-No Report  Result Date: 01/28/2021 CLINICAL DATA:  Left hip ORIF EXAM: OPERATIVE LEFT HIP (WITH PELVIS IF PERFORMED) 4 VIEWS TECHNIQUE: Fluoroscopic spot image(s) were submitted for interpretation post-operatively. COMPARISON:  Left hip radiographs-01/27/2021; CT abdomen pelvis-01/27/2021 FINDINGS: Four spot intraoperative fluoroscopic images of the left hip are provided for review. Provided images demonstrate the sequela of intramedullary rod fixation of the proximal femur and dynamic screw fixation of the femoral neck. The distal end of the femoral rod is transfixed with a single cancellous screw. Alignment of the intertrochanteric femur fracture appears near anatomic though there is persistent displacement of the lesser trochanter. Minimal amount of adjacent subcutaneous emphysema. No radiopaque foreign body. IMPRESSION: Post uncomplicated ORIF left intertrochanteric femur fracture. Electronically Signed   By: Sandi Mariscal M.D.   On: 01/28/2021 15:30   DG HIP OPERATIVE UNILAT WITH PELVIS LEFT  Result Date: 01/28/2021 CLINICAL DATA:  Left hip ORIF EXAM: OPERATIVE LEFT HIP (WITH PELVIS IF PERFORMED) 4 VIEWS TECHNIQUE: Fluoroscopic spot image(s) were submitted for interpretation post-operatively. COMPARISON:  Left hip radiographs-01/27/2021; CT abdomen pelvis-01/27/2021 FINDINGS: Four spot intraoperative fluoroscopic images of the left hip are provided for review. Provided images demonstrate the sequela of intramedullary rod fixation of the proximal femur and dynamic screw fixation of the femoral neck.  The distal end of  the femoral rod is transfixed with a single cancellous screw. Alignment of the intertrochanteric femur fracture appears near anatomic though there is persistent displacement of the lesser trochanter. Minimal amount of adjacent subcutaneous emphysema. No radiopaque foreign body. IMPRESSION: Post uncomplicated ORIF left intertrochanteric femur fracture. Electronically Signed   By: Sandi Mariscal M.D.   On: 01/28/2021 15:30   DG Hip Unilat W or Wo Pelvis 2-3 Views Left  Result Date: 01/27/2021 CLINICAL DATA:  Post fall with left hip pain. EXAM: DG HIP (WITH OR WITHOUT PELVIS) 2-3V LEFT COMPARISON:  Pelvis radiograph 12/29/2020 FINDINGS: Bones are diffusely under mineralized. No visualized fracture of the pelvis or left hip. Femoral head is seated. Mild left hip osteoarthritis with joint space narrowing and spurring. No visualized pubic rami fracture. Pubic symphysis and sacroiliac joints are congruent. IMPRESSION: No fracture of the pelvis or left hip. Electronically Signed   By: Keith Rake M.D.   On: 01/27/2021 18:30   DG Femur Portable 1 View Left  Result Date: 01/27/2021 CLINICAL DATA:  Fall. EXAM: LEFT FEMUR PORTABLE 1 VIEW; PORTABLE LEFT KNEE - 1-2 VIEW COMPARISON:  The left knee x-ray 12/31/2015. FINDINGS: Left knee: Left knee arthroplasty appears in anatomic alignment. There is no evidence for hardware loosening or acute fracture. There is no significant joint effusion. Vascular calcifications are seen in the soft tissues. Left femur: No acute fracture or dislocation identified. There are vascular calcifications in the soft tissues. IMPRESSION: 1. No acute fracture or dislocation of the left femur or left knee. 2. Left knee arthroplasty in anatomic alignment. Electronically Signed   By: Ronney Asters M.D.   On: 01/27/2021 21:19   CT CHEST ABDOMEN PELVIS WO CONTRAST  Result Date: 01/28/2021 CLINICAL DATA:  Fall, left hip pain EXAM: CT CHEST, ABDOMEN AND PELVIS WITHOUT CONTRAST TECHNIQUE:  Multidetector CT imaging of the chest, abdomen and pelvis was performed following the standard protocol without IV contrast. COMPARISON:  None. FINDINGS: CT CHEST FINDINGS Cardiovascular: The heart is normal in size. No pericardial effusion. No evidence of thoracic aortic aneurysm. Atherosclerotic calcifications of the arch. Mitral valve annular calcifications. Mild coronary atherosclerosis of the LAD. Mediastinum/Nodes: No suspicious mediastinal lymphadenopathy. Visualized thyroid is unremarkable. Lungs/Pleura: Biapical pleural-parenchymal scarring. No suspicious pulmonary nodules. Very mild centrilobular emphysematous changes in the upper lobes. No focal consolidation or aspiration. No pleural effusion or pneumothorax. Musculoskeletal: No fracture is seen. Sternum, clavicles, scapulae, and bilateral ribs are intact. Dedicated thoracic spine has been performed and will be reported separately. CT ABDOMEN PELVIS FINDINGS Hepatobiliary: Scattered hepatic cysts measuring up to 2.9 cm, benign. No perihepatic fluid/hemorrhage. Status post cholecystectomy. No intrahepatic or extrahepatic duct dilatation. Pancreas: Within normal limits. Spleen: Within normal limits.  No perisplenic fluid/hemorrhage. Adrenals/Urinary Tract: Adrenal glands are within normal limits. Bilateral renal cysts, measuring up to 5.3 cm in the anterior left upper kidney, benign. No hydronephrosis. Bladder is within normal limits. Stomach/Bowel: Stomach is notable for a small hiatal hernia. No evidence of bowel obstruction. Appendix is not discretely visualized. Sigmoid diverticulosis, without evidence of diverticulitis. Vascular/Lymphatic: No evidence of abdominal aortic aneurysm. Atherosclerotic calcifications of the abdominal aorta and branch vessels. No suspicious abdominopelvic lymphadenopathy. Reproductive: Status post hysterectomy. Right ovary is within normal limits. 4.2 cm simple left ovarian cyst (series 3/image 99). No follow-up imaging  recommended. Note: This recommendation does not apply to premenarchal patients and to those with increased risk (genetic, family history, elevated tumor markers or other high-risk factors) of ovarian cancer. Reference: Bernette Redbird  2020 Feb; 17(2):248-254 Other: No abdominopelvic ascites. No hemoperitoneum or free air. Musculoskeletal: Comminuted intertrochanteric left hip fracture with mildly displaced lesser trochanter fragment and mild foreshortening. Overlying minimal subcutaneous bruising/hemorrhage (series 3/image 124). Visualized bony pelvis and right femur are intact. Mild degenerative changes of the bilateral hips. Left gluteal intramuscular lipoma (series 3/image 91), benign. Dedicated lumbar spine has been performed and will be reported separately. IMPRESSION: Comminuted, displaced left hip fracture, as above. Otherwise, no evidence of traumatic injury to the chest/abdomen. Dedicated thoracolumbar spine has been performed and will be reported separately. Electronically Signed   By: Julian Hy M.D.   On: 01/28/2021 00:12   (Echo, Carotid, EGD, Colonoscopy, ERCP)    Subjective: Comfortable in bed.  Discharge Exam: Vitals:   02/01/21 2141 02/02/21 0500  BP: (!) 135/56 (!) 175/63  Pulse: 78 100  Resp: 16   Temp: (!) 97.5 F (36.4 C) 99.5 F (37.5 C)  SpO2: 98% 98%   Vitals:   01/31/21 2116 02/01/21 0554 02/01/21 2141 02/02/21 0500  BP: (!) 127/54 (!) 112/38 (!) 135/56 (!) 175/63  Pulse: 73 79 78 100  Resp: 18 15 16    Temp: 98.2 F (36.8 C) 99 F (37.2 C) (!) 97.5 F (36.4 C) 99.5 F (37.5 C)  TempSrc: Axillary  Axillary Axillary  SpO2: 94% 97% 98% 98%  Weight:      Height:        General: Pt is alert, awake, not in acute distress Cardiovascular: RRR, S1/S2 +, no rubs, no gallops Respiratory: CTA bilaterally, no wheezing, no rhonchi Abdominal: Soft, NT, ND, bowel sounds + Extremities: no edema, no cyanosis    The results of significant diagnostics from this  hospitalization (including imaging, microbiology, ancillary and laboratory) are listed below for reference.     Microbiology: Recent Results (from the past 240 hour(s))  Resp Panel by RT-PCR (Flu A&B, Covid) Nasopharyngeal Swab     Status: None   Collection Time: 01/27/21  5:50 PM   Specimen: Nasopharyngeal Swab; Nasopharyngeal(NP) swabs in vial transport medium  Result Value Ref Range Status   SARS Coronavirus 2 by RT PCR NEGATIVE NEGATIVE Final    Comment: (NOTE) SARS-CoV-2 target nucleic acids are NOT DETECTED.  The SARS-CoV-2 RNA is generally detectable in upper respiratory specimens during the acute phase of infection. The lowest concentration of SARS-CoV-2 viral copies this assay can detect is 138 copies/mL. A negative result does not preclude SARS-Cov-2 infection and should not be used as the sole basis for treatment or other patient management decisions. A negative result may occur with  improper specimen collection/handling, submission of specimen other than nasopharyngeal swab, presence of viral mutation(s) within the areas targeted by this assay, and inadequate number of viral copies(<138 copies/mL). A negative result must be combined with clinical observations, patient history, and epidemiological information. The expected result is Negative.  Fact Sheet for Patients:  EntrepreneurPulse.com.au  Fact Sheet for Healthcare Providers:  IncredibleEmployment.be  This test is no t yet approved or cleared by the [place] FDA and  has been authorized for detection and/or diagnosis of SARS-CoV-2 by FDA under an Emergency Use Authorization (EUA). This EUA will remain  in effect (meaning this test can be used) for the duration of the COVID-19 declaration under Section 564(b)(1) of the Act, 21 U.S.C.section 360bbb-3(b)(1), unless the authorization is terminated  or revoked sooner.       Influenza A by PCR NEGATIVE NEGATIVE Final    Influenza B by PCR NEGATIVE NEGATIVE Final  Comment: (NOTE) The Xpert Xpress SARS-CoV-2/FLU/RSV plus assay is intended as an aid in the diagnosis of influenza from Nasopharyngeal swab specimens and should not be used as a sole basis for treatment. Nasal washings and aspirates are unacceptable for Xpert Xpress SARS-CoV-2/FLU/RSV testing.  Fact Sheet for Patients: EntrepreneurPulse.com.au  Fact Sheet for Healthcare Providers: IncredibleEmployment.be  This test is not yet approved or cleared by the [place] FDA and has been authorized for detection and/or diagnosis of SARS-CoV-2 by FDA under an Emergency Use Authorization (EUA). This EUA will remain in effect (meaning this test can be used) for the duration of the COVID-19 declaration under Section 564(b)(1) of the Act, 21 U.S.C. section 360bbb-3(b)(1), unless the authorization is terminated or revoked.  Performed at [facility], [place] [address], [place], [place] [address]   Surgical pcr screen     Status: Abnormal   Collection Time: 01/28/21 12:34 PM   Specimen: Nasal Mucosa; Nasal Swab  Result Value Ref Range Status   MRSA, PCR POSITIVE (A) NEGATIVE Final    Comment: RESULT CALLED TO, READ BACK BY AND VERIFIED WITH:  KAT HARGROVE 1520 ADL     Staphylococcus aureus POSITIVE (A) NEGATIVE Final    Comment: (NOTE) The Xpert SA Assay (FDA approved for NASAL specimens in patients 60 years of age and older), is one component of a comprehensive surveillance program. It is not intended to diagnose infection nor to guide or monitor treatment. Performed at [facility], [place] [address], [place], [place] [address]      Labs: BNP (last 3 results) Recent Labs    01/29/21 0120 01/30/21 0127 01/31/21 0250  BNP 497.8* 173.2* [id number]*   Basic Metabolic Panel: Recent Labs  Lab 01/27/21 1745 01/29/21 0120 01/30/21 0127 01/31/21 0250  NA 139 136 134* 136  K 4.9 5.6* 4.9 4.5   CL 106 104 102 104  CO2 25 23 20* 25  GLUCOSE 101* 152* 116* 104*  BUN 40* 39* 52* 51*  CREATININE 1.54* 1.92* 2.63* 2.22*  CALCIUM 9.3 8.4* 8.2* 8.3*  MG  --  2.0 2.1 2.1   Liver Function Tests: Recent Labs  Lab 01/27/21 1745 01/29/21 0120 01/30/21 0127 01/31/21 0250  AST 28 28 26 30   ALT 11 14 8 8   ALKPHOS 56 44 49 48  BILITOT 0.4 0.6 0.6 0.6  PROT 6.9 5.9* 6.0* 5.7*  ALBUMIN 3.4* 2.9* 3.0* 2.7*   No results for input(s): LIPASE, AMYLASE in the last 168 hours. No results for input(s): AMMONIA in the last 168 hours. CBC: Recent Labs  Lab 01/27/21 1745 01/29/21 0652 01/30/21 0127 01/31/21 0250  WBC 7.7 13.0* 11.8* 8.7  NEUTROABS  --  10.2* 8.5* 6.5  HGB 11.6* 9.2* 8.9* 7.7*  HCT 37.9 29.8* 28.5* 24.7*  MCV 101.6* 101.0* 101.8* 100.4*  PLT 227 212 215 182   Cardiac Enzymes: No results for input(s): CKTOTAL, CKMB, CKMBINDEX, TROPONINI in the last 168 hours. BNP: Invalid input(s): POCBNP CBG: No results for input(s): GLUCAP in the last 168 hours. D-Dimer No results for input(s): DDIMER in the last 72 hours. Hgb A1c No results for input(s): HGBA1C in the last 72 hours. Lipid Profile No results for input(s): CHOL, HDL, LDLCALC, TRIG, CHOLHDL, LDLDIRECT in the last 72 hours. Thyroid function studies No results for input(s): TSH, T4TOTAL, T3FREE, THYROIDAB in the last 72 hours.  Invalid input(s): FREET3 Anemia work up No results for input(s): VITAMINB12, FOLATE, FERRITIN, TIBC, IRON, RETICCTPCT in the last 72 hours. Urinalysis  Component Value Date/Time   COLORURINE COLORLESS (A) 01/27/2021 2050   APPEARANCEUR CLEAR 01/27/2021 2050   LABSPEC 1.008 01/27/2021 2050   PHURINE 7.0 01/27/2021 2050   GLUCOSEU NEGATIVE 01/27/2021 2050   [place] NEGATIVE 01/27/2021 2050   [place] NEGATIVE 01/27/2021 2050   [place] 01/27/2021 2050   PROTEINUR NEGATIVE 01/27/2021 2050   UROBILINOGEN 0.2 12/12/2010 0825   NITRITE NEGATIVE 01/27/2021 2050    LEUKOCYTESUR NEGATIVE 01/27/2021 2050   Sepsis Labs Invalid input(s): PROCALCITONIN,  WBC,  LACTICIDVEN Microbiology Recent Results (from the past 240 hour(s))  Resp Panel by RT-PCR (Flu A&B, Covid) Nasopharyngeal Swab     Status: None   Collection Time: 01/27/21  5:50 PM   Specimen: Nasopharyngeal Swab; Nasopharyngeal(NP) swabs in vial transport medium  Result Value Ref Range Status   SARS Coronavirus 2 by RT PCR NEGATIVE NEGATIVE Final    Comment: (NOTE) SARS-CoV-2 target nucleic acids are NOT DETECTED.  The SARS-CoV-2 RNA is generally detectable in upper respiratory specimens during the acute phase of infection. The lowest concentration of SARS-CoV-2 viral copies this assay can detect is 138 copies/mL. A negative result does not preclude SARS-Cov-2 infection and should not be used as the sole basis for treatment or other patient management decisions. A negative result may occur with  improper specimen collection/handling, submission of specimen other than nasopharyngeal swab, presence of viral mutation(s) within the areas targeted by this assay, and inadequate number of viral copies(<138 copies/mL). A negative result must be combined with clinical observations, patient history, and epidemiological information. The expected result is Negative.  Fact Sheet for Patients:  EntrepreneurPulse.com.au  Fact Sheet for Healthcare Providers:  IncredibleEmployment.be  This test is no t yet approved or cleared by the [place] FDA and  has been authorized for detection and/or diagnosis of SARS-CoV-2 by FDA under an Emergency Use Authorization (EUA). This EUA will remain  in effect (meaning this test can be used) for the duration of the COVID-19 declaration under Section 564(b)(1) of the Act, 21 U.S.C.section 360bbb-3(b)(1), unless the authorization is terminated  or revoked sooner.       Influenza A by PCR NEGATIVE NEGATIVE Final   Influenza B  by PCR NEGATIVE NEGATIVE Final    Comment: (NOTE) The Xpert Xpress SARS-CoV-2/FLU/RSV plus assay is intended as an aid in the diagnosis of influenza from Nasopharyngeal swab specimens and should not be used as a sole basis for treatment. Nasal washings and aspirates are unacceptable for Xpert Xpress SARS-CoV-2/FLU/RSV testing.  Fact Sheet for Patients: EntrepreneurPulse.com.au  Fact Sheet for Healthcare Providers: IncredibleEmployment.be  This test is not yet approved or cleared by the [place] FDA and has been authorized for detection and/or diagnosis of SARS-CoV-2 by FDA under an Emergency Use Authorization (EUA). This EUA will remain in effect (meaning this test can be used) for the duration of the COVID-19 declaration under Section 564(b)(1) of the Act, 21 U.S.C. section 360bbb-3(b)(1), unless the authorization is terminated or revoked.  Performed at [facility], [place] [address], [place], [place] [address]   Surgical pcr screen     Status: Abnormal   Collection Time: 01/28/21 12:34 PM   Specimen: Nasal Mucosa; Nasal Swab  Result Value Ref Range Status   MRSA, PCR POSITIVE (A) NEGATIVE Final    Comment: RESULT CALLED TO, READ BACK BY AND VERIFIED WITH:  KAT HARGROVE 1520 ADL     Staphylococcus aureus POSITIVE (A) NEGATIVE Final    Comment: (NOTE) The Xpert SA Assay (FDA approved for  NASAL specimens in patients 82 years of age and older), is one component of a comprehensive surveillance program. It is not intended to diagnose infection nor to guide or monitor treatment. Performed at [facility], [address], [place], [place] [address]      SIGNED:   Marinda Elk, MD  [facility] 02/02/2021, 9:23 AM Pager   If 7PM-7AM, please contact night-coverage [email or web address] Password [email or web address]

## 2021-02-02 NOTE — Progress Notes (Signed)
Will go to facility with IV intact.

## 2021-02-02 NOTE — TOC Progression Note (Addendum)
Transition of Care [facility]) - Progression Note    Patient Details  Name: KAMILA BRODA MRN: [id number] Date of Birth: 10-10-1929  Transition of [facility]) CM/SW Contact  Nadene Rubins Adria Devon, RN Phone Number: 02/02/2021, 9:53 AM  Clinical Narrative:     Plan to discharge to [facility] today. Watt Climes with [facility] will let NCM and bedside nurse know when consents are completed and when to call report and PTAR.  PTAR paperwork and DNR form in chart   Bedside nurse will cap IV prior to transport. [facility] will have Dilaudid drip at [facility]    1350 Received secure chat [facility] ready for patient. PTAR called.  Expected Discharge Plan: Hospice Medical Facility    Expected Discharge Plan and Services Expected Discharge Plan: Hospice Medical Facility   Discharge Planning Services: CM Consult Post Acute Care Choice: Hospice Living arrangements for the past 2 months: Apartment Expected Discharge Date: 02/02/21               DME Arranged: N/A DME Agency: NA       HH Arranged: NA           Social Determinants of Health (SDOH) Interventions    Readmission Risk Interventions No flowsheet data found.

## 2021-02-15 DEATH — deceased

## 2021-09-19 IMAGING — DX DG ABD PORTABLE 1V
1 series · 1 of 1 positions shown · non-contrast
Comparison: None.

CLINICAL DATA: 89-year-old female with a history of abdominal pain
with nausea and vomiting

EXAM:
PORTABLE ABDOMEN - 1 VIEW

[abdomen]
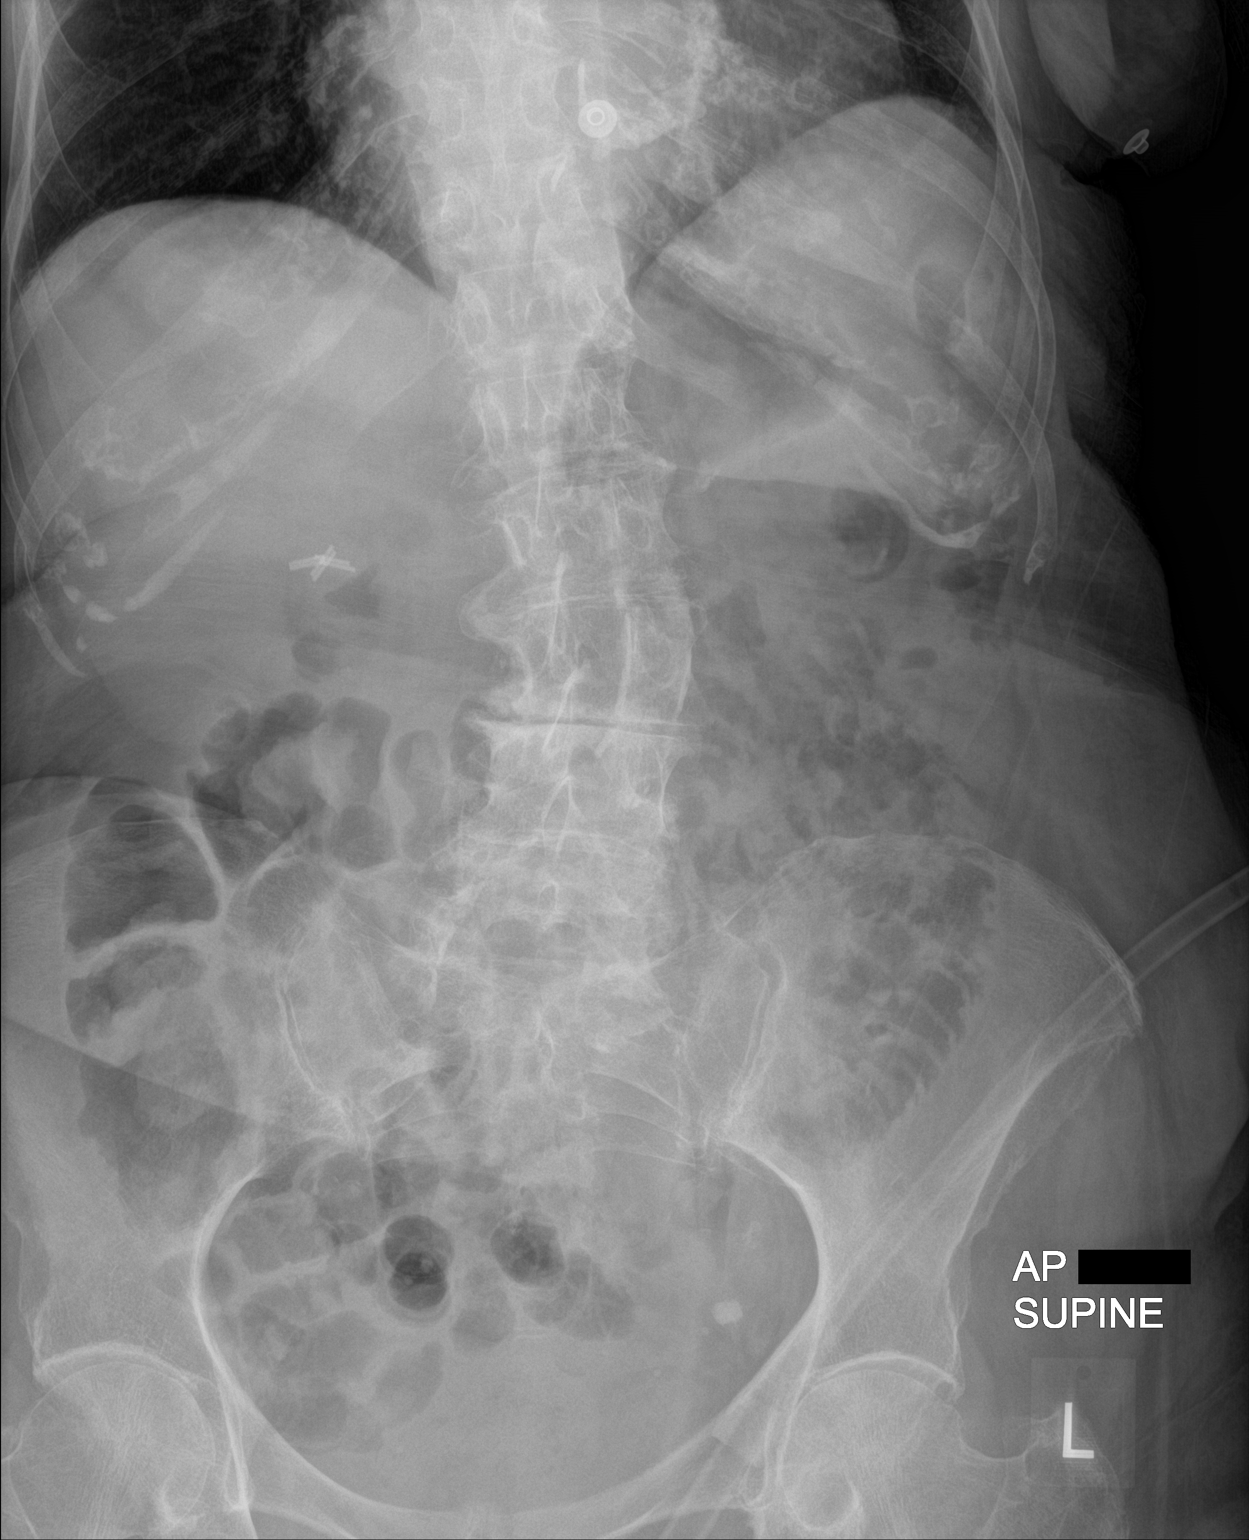

[1 of 1 positions shown; findings below may reference images not displayed]

FINDINGS: Gas within stomach small bowel and colon. No abnormal distension. No
air-fluid levels on this single supine image.

No significant formed stool burden. Surgical changes of
cholecystectomy.

Presumably pelvic phleboliths.

No unexpected radiopaque foreign body. No unexpected soft tissue
density or calcifications.

Degenerative changes of the spine. Mild apex left curvature. No
acute displaced fracture.
IMPRESSION: Nonobstructive bowel gas pattern.

## 2022-01-10 IMAGING — US US RENAL
1 series · 14 of 25 positions shown · non-contrast
Comparison: 01/27/2021 CT

CLINICAL DATA: Acute kidney injury

EXAM:
RENAL / URINARY TRACT ULTRASOUND COMPLETE

[Series 1: us renal · 14 of 51 slices shown]
[im 1/51]
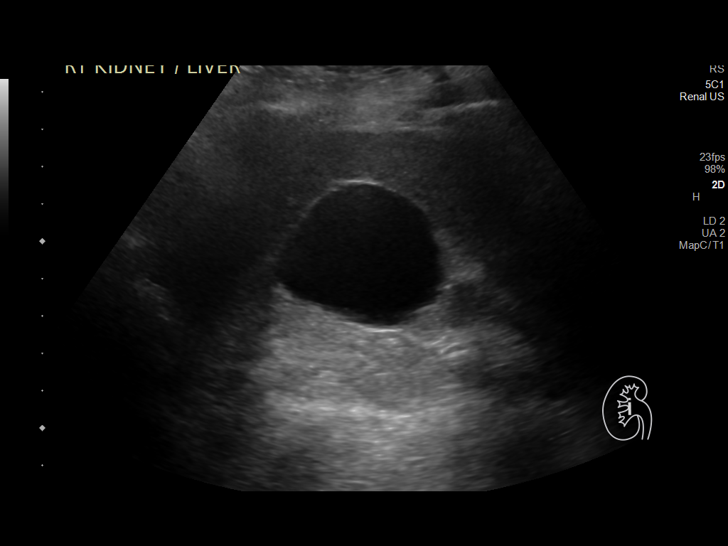
[im 5/51]
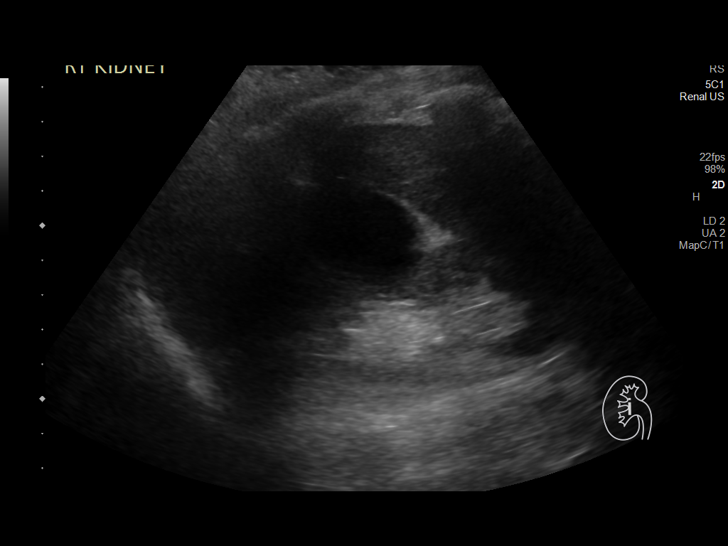
[im 9/51]
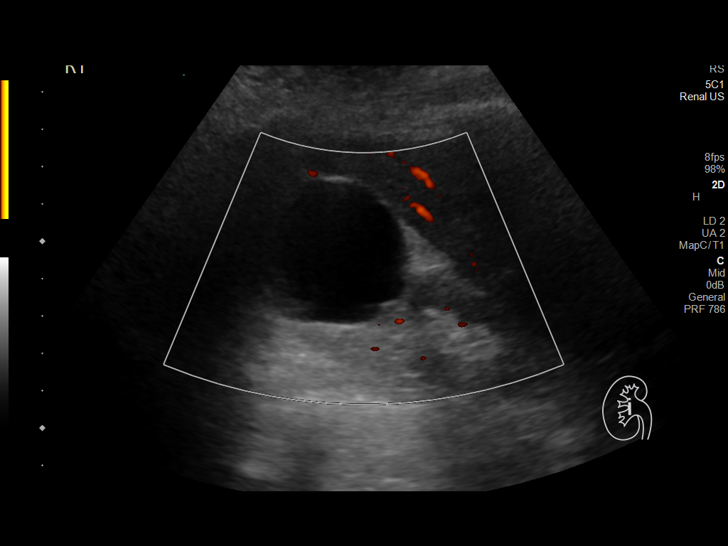
[im 13/51]
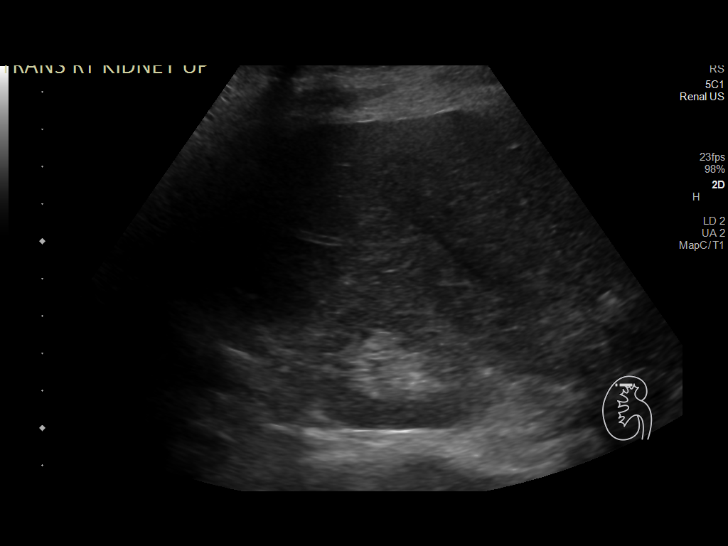
[im 17/51]
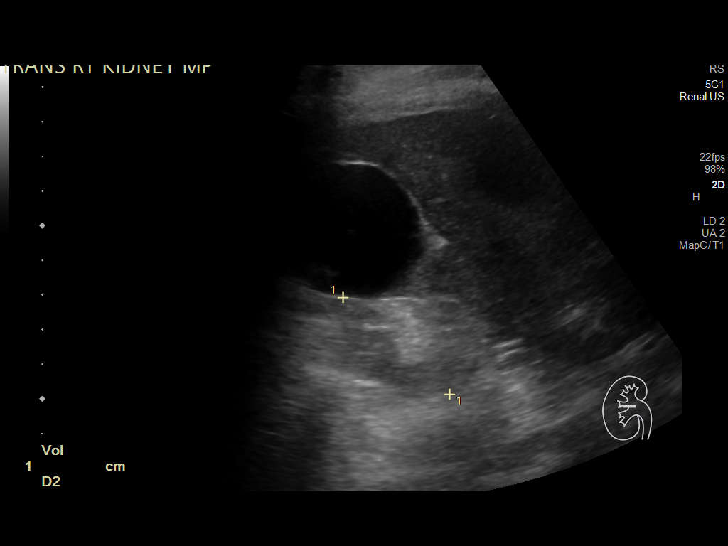
[im 19/51]
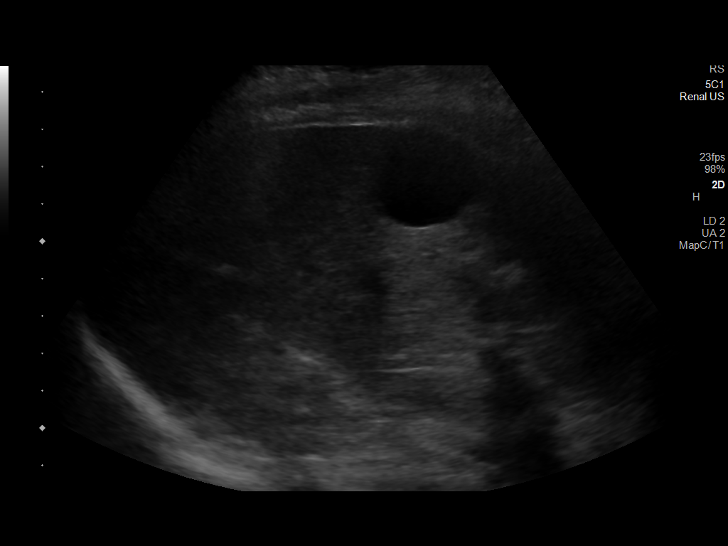
[im 23/51]
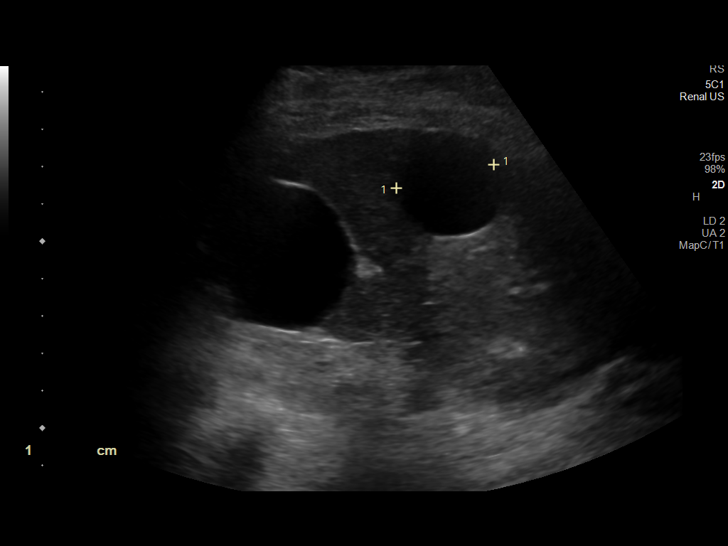
[im 28/51]
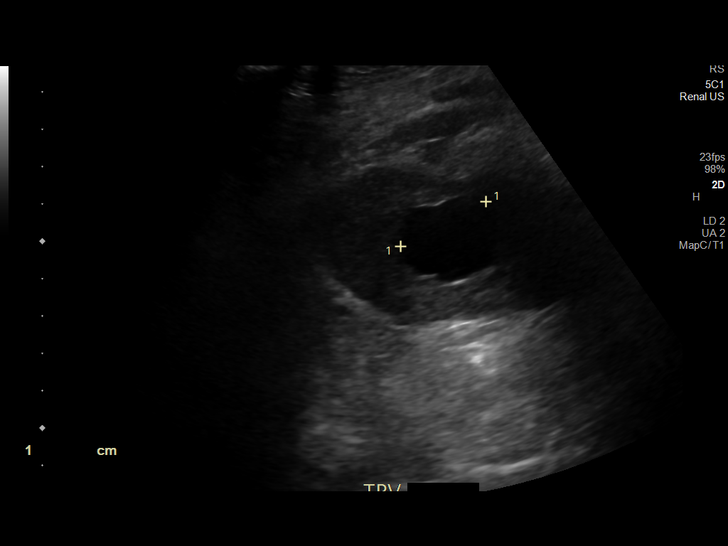
[im 32/51]
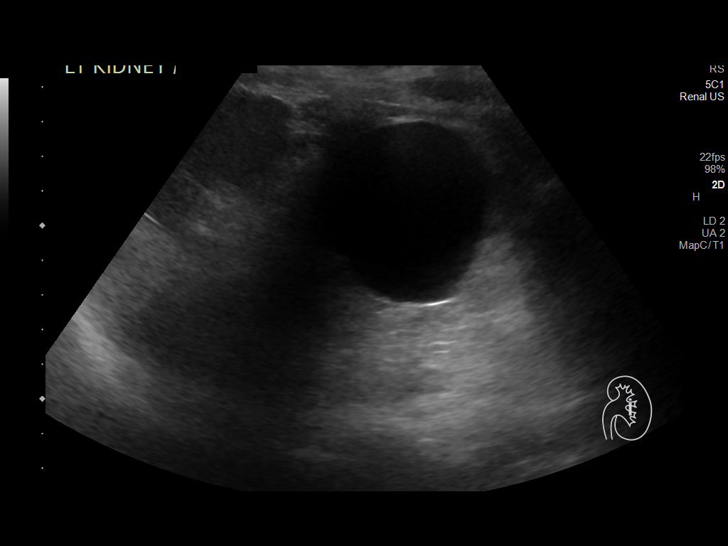
[im 34/51]
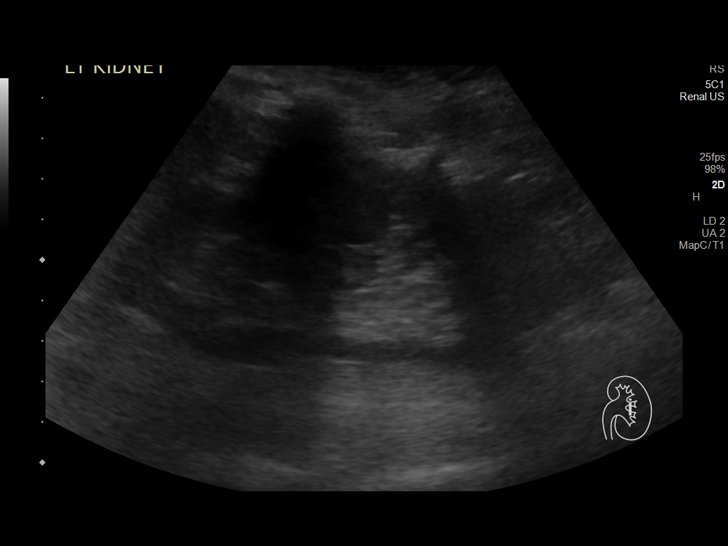
[im 38/51]
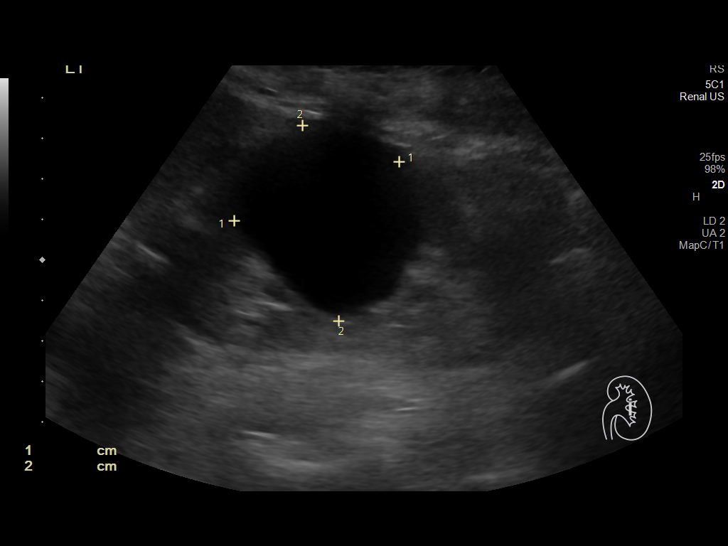
[im 42/51]
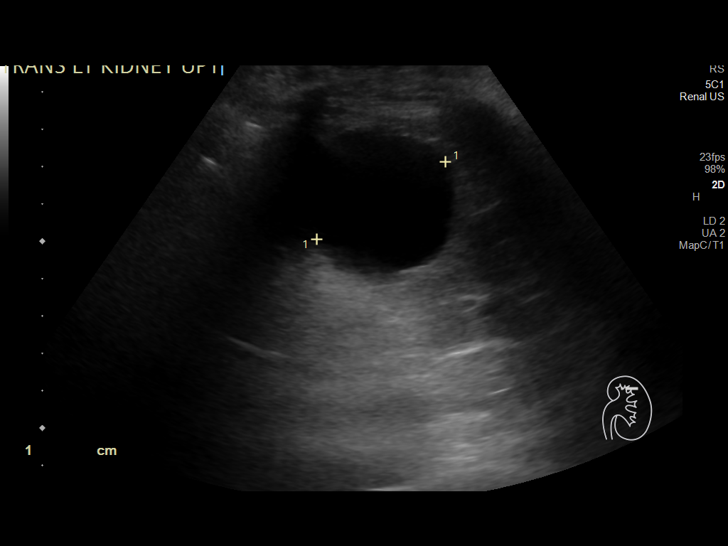
[im 46/51]
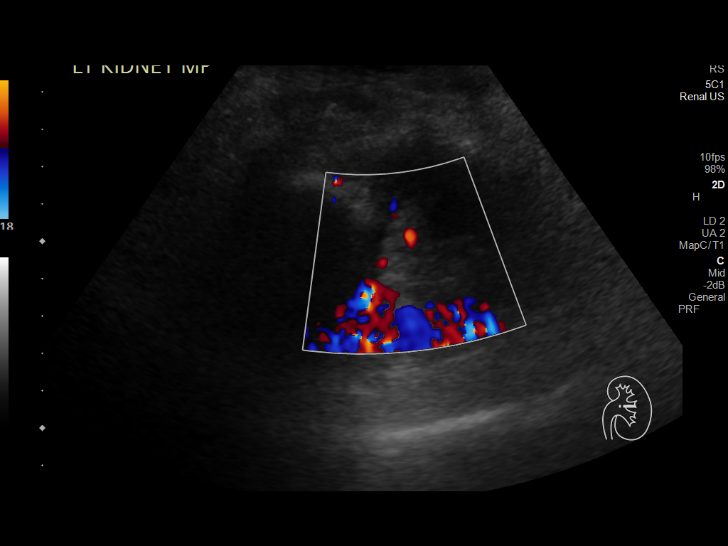
[im 51/51]
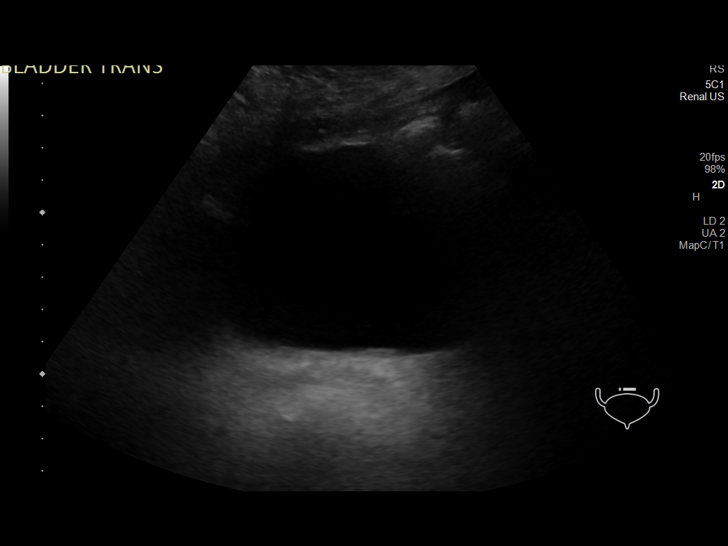

[14 of 25 positions shown; findings below may reference images not displayed]

FINDINGS: Right Kidney:

Renal measurements: 8.9 x 3.7 x 4.2 cm = volume: 71 mL. Increased
renal cortical echogenicity. 4.0 cm exophytic simple cyst emanating
from the interpolar region. No shadowing stone or hydronephrosis
visualized.

Left Kidney:

Renal measurements: 9.2 x 5.1 x 5.0 cm = volume: 122 mL. Increased
renal cortical echogenicity. 4.9 cm simple cyst at the interpolar
region of the left kidney. No shadowing stone or hydronephrosis
visualized.

Bladder:

Appears normal for degree of bladder distention.

Other:

Several hepatic cysts are incidentally noted.
IMPRESSION: 1. Negative for obstructive uropathy.
2. Echogenic kidneys suggest sequela of medical renal disease.
